# Patient Record
Sex: Female | Born: 1960 | Hispanic: No | Smoking: Never smoker
Health system: Southern US, Community
[De-identification: ages and names within clinical notes are randomized; demographics above are authoritative.]

## PROBLEM LIST (undated history)

## (undated) DIAGNOSIS — Z860101 Personal history of adenomatous and serrated colon polyps: Secondary | ICD-10-CM

## (undated) DIAGNOSIS — F419 Anxiety disorder, unspecified: Secondary | ICD-10-CM

## (undated) DIAGNOSIS — K219 Gastro-esophageal reflux disease without esophagitis: Secondary | ICD-10-CM

## (undated) DIAGNOSIS — G620 Drug-induced polyneuropathy: Secondary | ICD-10-CM

## (undated) DIAGNOSIS — Z86010 Personal history of colonic polyps: Secondary | ICD-10-CM

## (undated) DIAGNOSIS — J45909 Unspecified asthma, uncomplicated: Secondary | ICD-10-CM

## (undated) DIAGNOSIS — F32A Depression, unspecified: Secondary | ICD-10-CM

## (undated) DIAGNOSIS — E039 Hypothyroidism, unspecified: Secondary | ICD-10-CM

## (undated) DIAGNOSIS — T451X5A Adverse effect of antineoplastic and immunosuppressive drugs, initial encounter: Secondary | ICD-10-CM

## (undated) DIAGNOSIS — E042 Nontoxic multinodular goiter: Secondary | ICD-10-CM

## (undated) HISTORY — DX: Unspecified asthma, uncomplicated: J45.909

## (undated) HISTORY — PX: DILATION AND CURETTAGE OF UTERUS: SHX78

## (undated) HISTORY — DX: Hypothyroidism, unspecified: E03.9

---

## 1999-04-13 ENCOUNTER — Other Ambulatory Visit: Admission: RE | Admit: 1999-04-13 | Discharge: 1999-04-13 | Payer: Self-pay | Admitting: *Deleted

## 2000-07-05 ENCOUNTER — Other Ambulatory Visit: Admission: RE | Admit: 2000-07-05 | Discharge: 2000-07-05 | Payer: Self-pay | Admitting: *Deleted

## 2000-08-17 ENCOUNTER — Encounter (INDEPENDENT_AMBULATORY_CARE_PROVIDER_SITE_OTHER): Payer: Self-pay

## 2000-08-17 ENCOUNTER — Ambulatory Visit (HOSPITAL_COMMUNITY): Admission: RE | Admit: 2000-08-17 | Discharge: 2000-08-17 | Payer: Self-pay | Admitting: *Deleted

## 2000-08-17 HISTORY — PX: DILATION AND EVACUATION: SHX1459

## 2006-08-02 ENCOUNTER — Other Ambulatory Visit: Admission: RE | Admit: 2006-08-02 | Discharge: 2006-08-02 | Payer: Self-pay | Admitting: *Deleted

## 2007-12-02 ENCOUNTER — Other Ambulatory Visit: Admission: RE | Admit: 2007-12-02 | Discharge: 2007-12-02 | Payer: Self-pay | Admitting: Family Medicine

## 2010-11-04 NOTE — H&P (Signed)
[facility]  Patient:    Tiffany [name], Tiffany [name]                          MRN: [id number] Adm. Date:  [id number] Attending:  Donne Hazel                         History and Physical  HISTORY OF PRESENT ILLNESS:   Ms. Burney Gauze is a 50 year old female, gravida 2, para 1, admitted for D&E due to a missed AB. The patient was followed in early pregnancy with ultrasounds; on August 16, 2000, an ultrasound revealed a missed AB. The patient was then given the option of expectant management versus D&E. She requested D&E for termination of the pregnancy. She is healthy otherwise. Her blood type is A positive.  MEDICAL HISTORY:              History of hypothyroidism.  OBSTETRIC HISTORY:            Cesarean section x 1 at term.  SURGICAL HISTORY:             (1) Nasal surgery x 1 (2) Cesarean section x 1.  CURRENT MEDICATIONS:          Synthroid.  ALLERGIES:                    None known.  PHYSICAL EXAMINATION:  VITAL SIGNS:                  Stable, temperature 97.7, pulse 78, respirations 20, blood pressure 125/69.  GENERAL:                      Well-developed, well-nourished female in no acute distress.  HEENT:                        Within normal limits.  NECK:                         Supple without adenopathy or thyromegaly.  HEART:                        Regular rate and rhythm without murmur, gallop, or rub.  LUNGS:                        Clear to auscultation.  BREASTS:                      Done recently in the office was normal but is deferred upon admission.  ABDOMEN:                      Soft and benign.  EXTREMITIES AND NEUROLOGIC:   Grossly normal.  PELVIC:                       Normal external female genitalia, vagina/cervix clear. The uterus is about 6 weeks in size and freely mobile, the adnexa is clear of mass.  ADMITTING DIAGNOSES:          1. Missed abortion.                               2. Blood type A positive.  PLAN:  Suction dilation and evacuation.  DISCUSSION:                   The risk and benefits of surgery were explained to the patient. She was also given the option of expectant management but declined this. The risk of bleeding, infection, and risk of injury to surrounding organs were reviewed. The patient was allowed to ask questions and wished to proceed with D&E. DD:  08/17/00 TD:  08/17/00 Job: [id number]

## 2010-11-04 NOTE — Op Note (Signed)
[facility]  Patient:    Tiffany [name], Tiffany [name]                          MRN: [id number] Proc. Date: 08/17/00 Adm. Date:  [id number] Disc. Date: [id number] Attending:  Donne Hazel                           Operative Report  PREOPERATIVE DIAGNOSIS:       Missed abortion.  POSTOPERATIVE DIAGNOSIS:      Missed abortion.  PROCEDURE:                    Dilation and evacuation.  SURGEON:                      Willey Blade, M.D.  ANESTHESIA:                   MAC.  ESTIMATED BLOOD LOSS:         100 cc.  FINDINGS:                     Products of conception.  COMPLICATIONS:                None.  DESCRIPTION OF PROCEDURE:     The patient was taken to the operating room where a MAC anesthetic was administered. The patient was placed on the operating table in the dorsal lithotomy position, the perineum and vagina were prepped and draped in the usual sterile fashion with Betadine and sterile drapes. A speculum was placed and the anterior lip of the cervix was grasped with a single-tooth tenaculum. The cervix was then serially dilated until a #21 Pratt dilator fit easily into the endocervical os. Next, a #7 curved suction curet was introduced into the uterus and the uterus was systematically emptied in the standard fashion. Multiple passes were made throughout the uterus with products of conception aspirated. The uterus was then sharply curetted with a small serrated curet and noted to be empty. Two final passes were then made with the suction curet and its emptiness was noted. All vaginal instruments were removed. The patient was then awakened and taken to the recovery room in good condition. There were no perioperative complications. Maternal blood type A positive. DD:  08/17/00 TD:  08/19/00 Job: [id number]

## 2011-02-16 ENCOUNTER — Other Ambulatory Visit: Payer: Self-pay | Admitting: Family Medicine

## 2011-02-16 ENCOUNTER — Other Ambulatory Visit (HOSPITAL_COMMUNITY)
Admission: RE | Admit: 2011-02-16 | Discharge: 2011-02-16 | Disposition: A | Discharge status: Home / Self Care | Payer: BC Managed Care – PPO | Source: Ambulatory Visit | Attending: Family Medicine | Admitting: Family Medicine

## 2011-02-16 DIAGNOSIS — Z124 Encounter for screening for malignant neoplasm of cervix: Secondary | ICD-10-CM | POA: Insufficient documentation

## 2012-02-21 ENCOUNTER — Other Ambulatory Visit: Payer: Self-pay | Admitting: Family Medicine

## 2012-02-21 ENCOUNTER — Ambulatory Visit
Admission: RE | Admit: 2012-02-21 | Discharge: 2012-02-21 | Disposition: A | Discharge status: Home / Self Care | Payer: Managed Care, Other (non HMO) | Source: Ambulatory Visit | Attending: Family Medicine | Admitting: Family Medicine

## 2012-02-21 ENCOUNTER — Other Ambulatory Visit (HOSPITAL_COMMUNITY)
Admission: RE | Admit: 2012-02-21 | Discharge: 2012-02-21 | Disposition: A | Discharge status: Home / Self Care | Payer: Managed Care, Other (non HMO) | Source: Ambulatory Visit | Attending: Family Medicine | Admitting: Family Medicine

## 2012-02-21 DIAGNOSIS — N9489 Other specified conditions associated with female genital organs and menstrual cycle: Secondary | ICD-10-CM

## 2012-02-21 DIAGNOSIS — Z124 Encounter for screening for malignant neoplasm of cervix: Secondary | ICD-10-CM | POA: Insufficient documentation

## 2013-02-25 ENCOUNTER — Other Ambulatory Visit: Payer: Self-pay | Admitting: Family Medicine

## 2013-02-25 DIAGNOSIS — E049 Nontoxic goiter, unspecified: Secondary | ICD-10-CM

## 2013-02-26 ENCOUNTER — Ambulatory Visit
Admission: RE | Admit: 2013-02-26 | Discharge: 2013-02-26 | Disposition: A | Discharge status: Home / Self Care | Payer: 59 | Source: Ambulatory Visit | Attending: Family Medicine | Admitting: Family Medicine

## 2013-02-26 DIAGNOSIS — E049 Nontoxic goiter, unspecified: Secondary | ICD-10-CM

## 2013-03-03 ENCOUNTER — Other Ambulatory Visit: Payer: Self-pay | Admitting: Family Medicine

## 2013-03-03 DIAGNOSIS — E041 Nontoxic single thyroid nodule: Secondary | ICD-10-CM

## 2013-03-05 ENCOUNTER — Other Ambulatory Visit (HOSPITAL_COMMUNITY)
Admission: RE | Admit: 2013-03-05 | Discharge: 2013-03-05 | Disposition: A | Discharge status: Home / Self Care | Payer: 59 | Source: Ambulatory Visit | Attending: Interventional Radiology | Admitting: Interventional Radiology

## 2013-03-05 ENCOUNTER — Ambulatory Visit
Admission: RE | Admit: 2013-03-05 | Discharge: 2013-03-05 | Disposition: A | Discharge status: Home / Self Care | Payer: 59 | Source: Ambulatory Visit | Attending: Family Medicine | Admitting: Family Medicine

## 2013-03-05 DIAGNOSIS — E041 Nontoxic single thyroid nodule: Secondary | ICD-10-CM | POA: Insufficient documentation

## 2013-06-10 ENCOUNTER — Other Ambulatory Visit: Payer: Self-pay | Admitting: Family Medicine

## 2013-06-10 DIAGNOSIS — N939 Abnormal uterine and vaginal bleeding, unspecified: Secondary | ICD-10-CM

## 2013-06-27 ENCOUNTER — Ambulatory Visit
Admission: RE | Admit: 2013-06-27 | Discharge: 2013-06-27 | Disposition: A | Discharge status: Home / Self Care | Payer: 59 | Source: Ambulatory Visit | Attending: Family Medicine | Admitting: Family Medicine

## 2013-06-27 DIAGNOSIS — N939 Abnormal uterine and vaginal bleeding, unspecified: Secondary | ICD-10-CM

## 2013-08-08 IMAGING — US US TRANSVAGINAL NON-OB
1 series · 14 of 25 positions shown · non-contrast
Comparison: None.

CLINICAL DATA: Adnexal mass.

TRANSABDOMINAL AND TRANSVAGINAL ULTRASOUND OF PELVIS
TECHNIQUE: Both transabdominal and transvaginal ultrasound
examinations of the pelvis were performed. Transabdominal technique
was performed for global imaging of the pelvis including uterus,
ovaries, adnexal regions, and pelvic cul-de-sac.
It was necessary to proceed with endovaginal exam following the
transabdominal exam to visualize the uterus, endometrium, ovaries
and adnexal regions.

[Series 1: us transvaginal non-ob · 0.26mm/px · 14 of 100 slices shown]
[im 1/100]
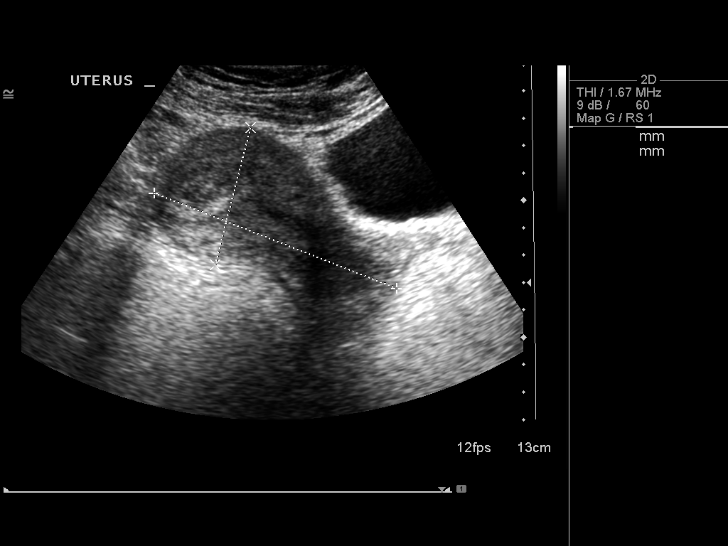
[im 9/100]
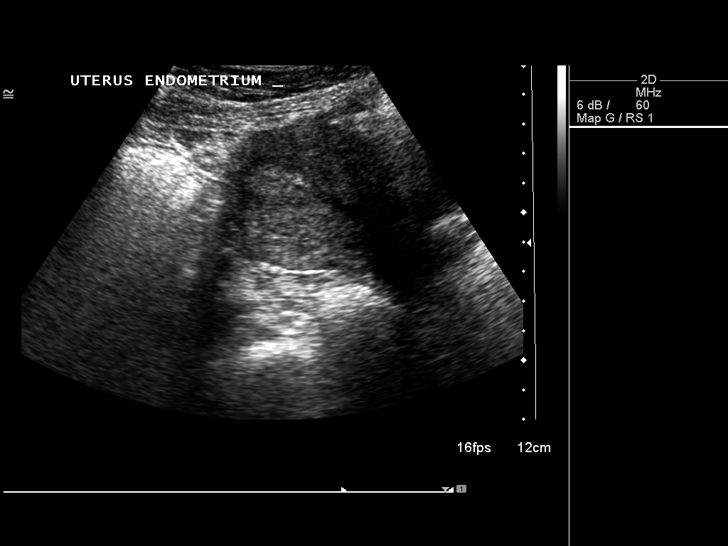
[im 17/100]
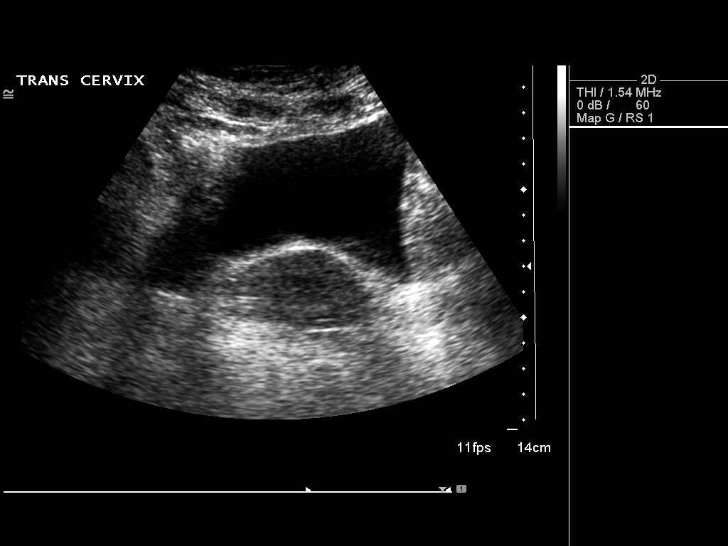
[im 25/100]
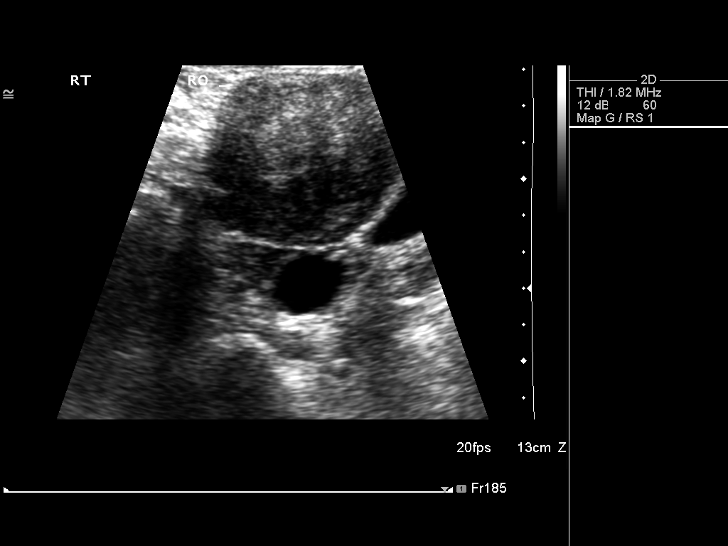
[im 34/100]
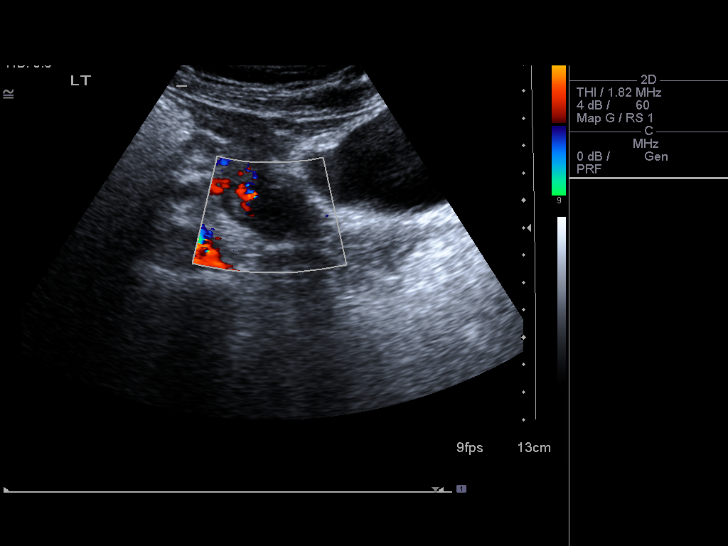
[im 38/100]
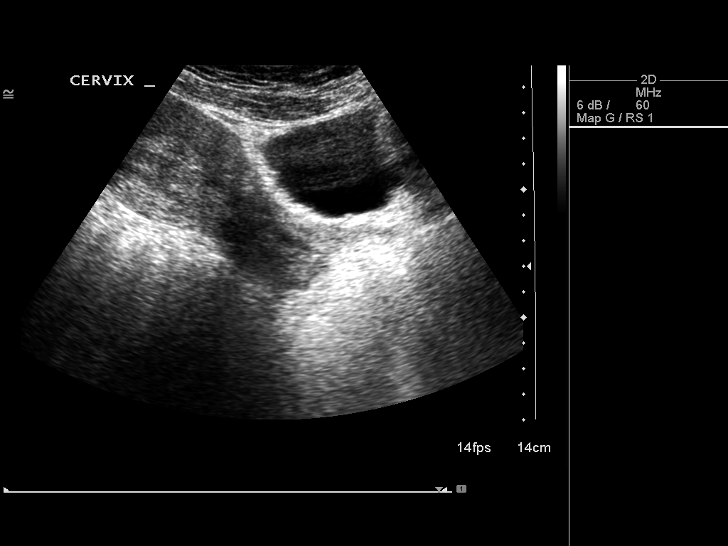
[im 46/100]
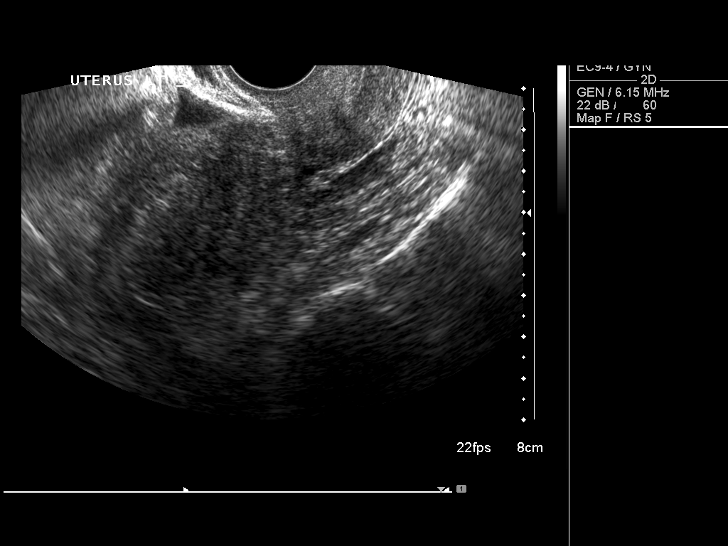
[im 54/100]
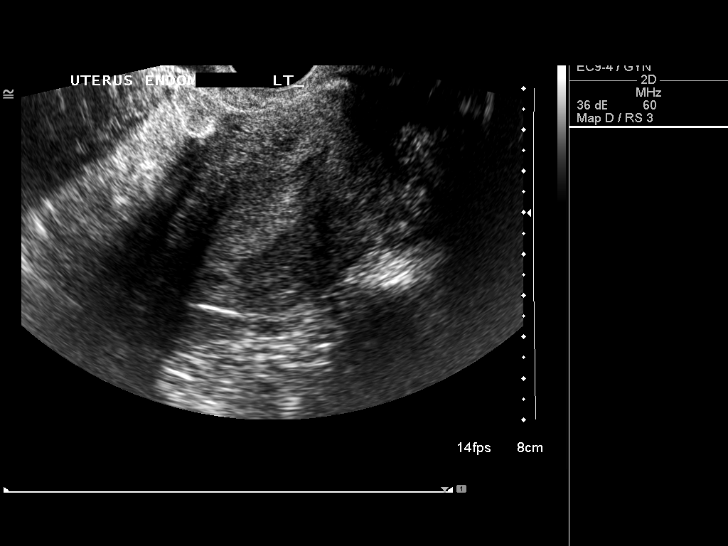
[im 62/100]
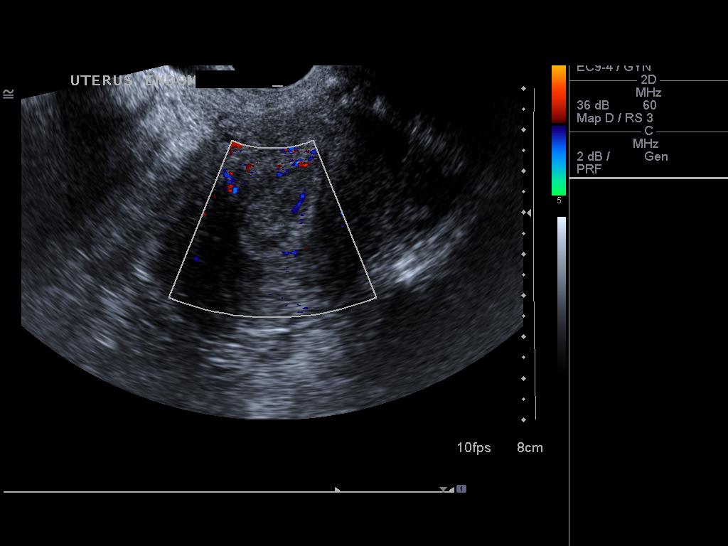
[im 67/100]
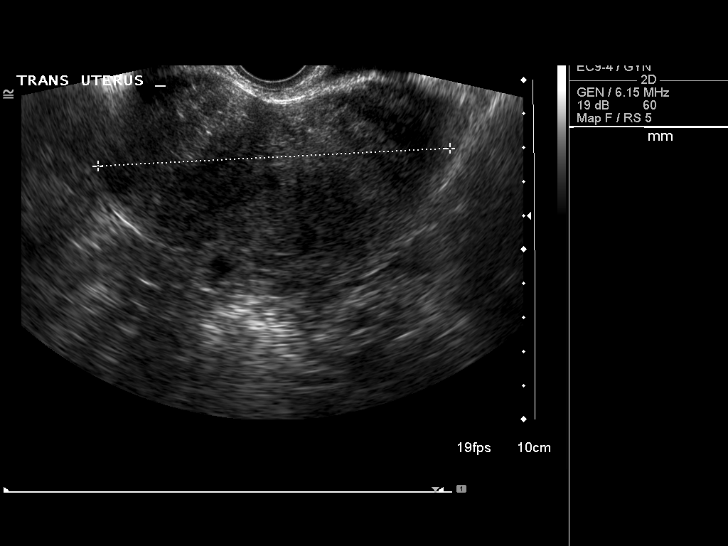
[im 75/100]
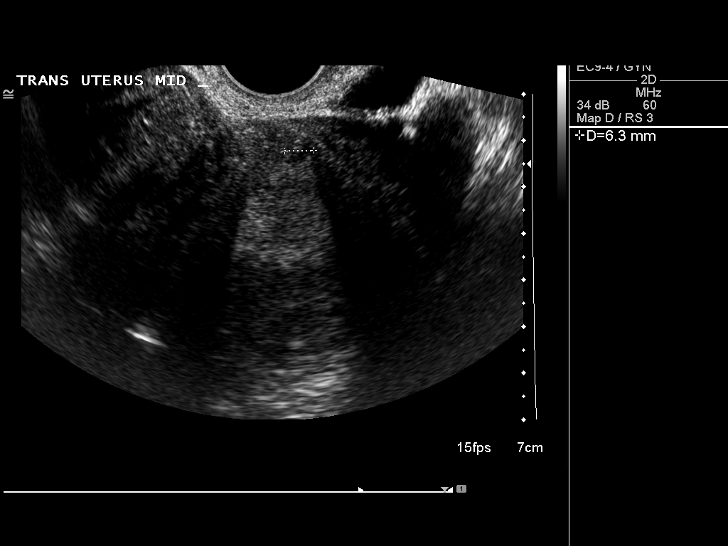
[im 83/100]
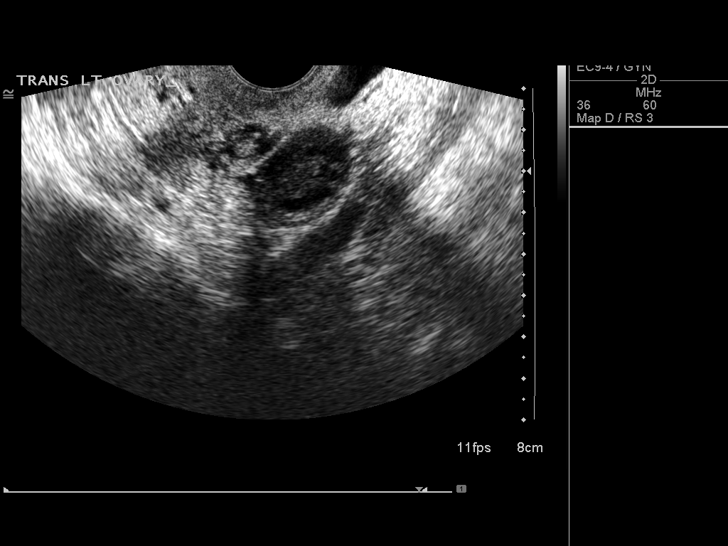
[im 91/100]
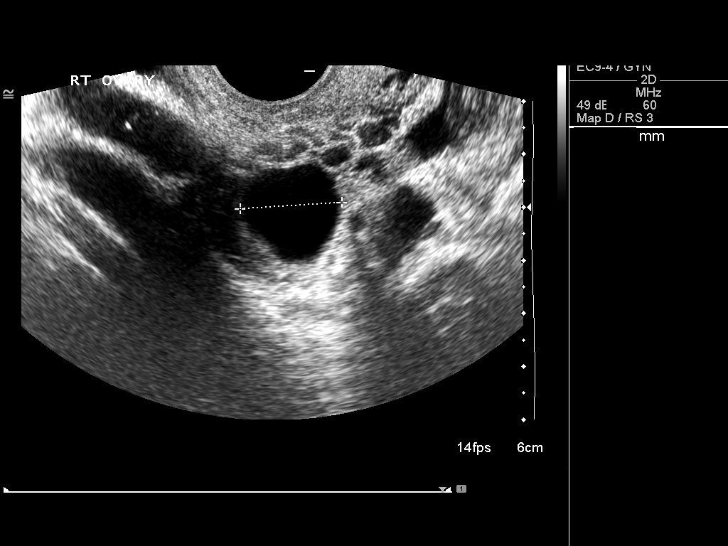
[im 100/100]
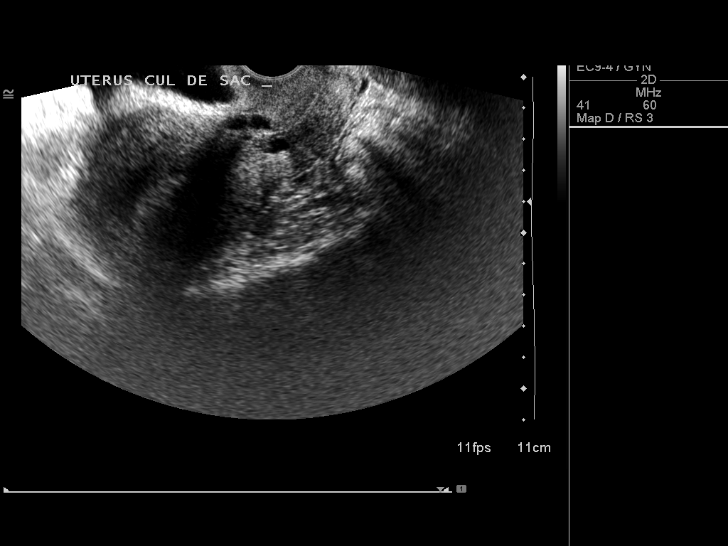

[14 of 25 positions shown; findings below may reference images not displayed]

FINDINGS: Uterus: Measures 9.9 x 8.8 x 10.4 cm.  Hypoechoic lesions measure
up to 5.1 x 4.7 x 5.0 cm off the right lateral aspect of the
fundus/body.

Endometrium: Measures 1.5 cm and appears slightly heterogeneous in
echogenicity.

Right ovary:  Measures 2.8 x 2.1 x 3.0 cm, negative. No adnexal
mass.

Left ovary: Measures 2.5 x 2.2 x 2.1 cm, negative. No adnexal mass.

Other findings: No free fluid.
IMPRESSION: Uterine fibroids.  A sizable fibroid along the right lateral aspect
of the uterine fundus/body may account for the right adnexal mass
questioned on physical exam..

## 2014-06-19 HISTORY — PX: COLONOSCOPY: SHX174

## 2014-08-21 IMAGING — US US THYROID BIOPSY
1 series · 13 of 13 positions shown · non-contrast
Comparison: none

INDICATION: Indeterminate nodule within the thyroid isthmus

[Series 1: us thyroid biopsy · 0.08mm/px · 13 acquisitions, 13 frames shown]
[im 1/13]
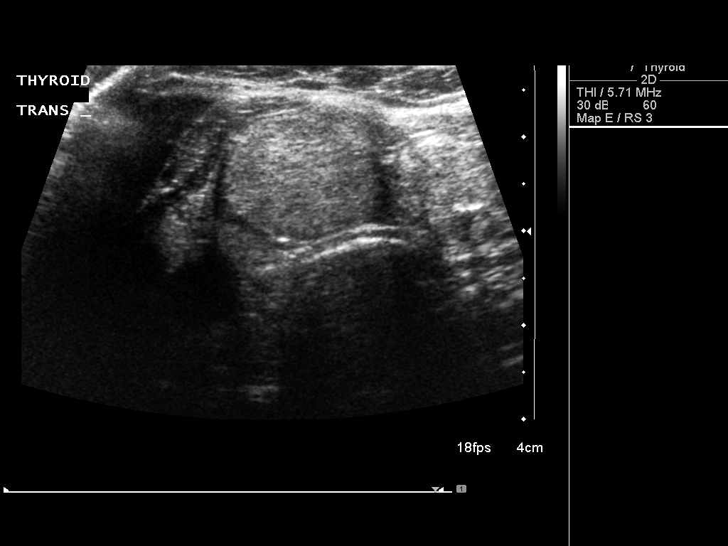
[im 2/13]
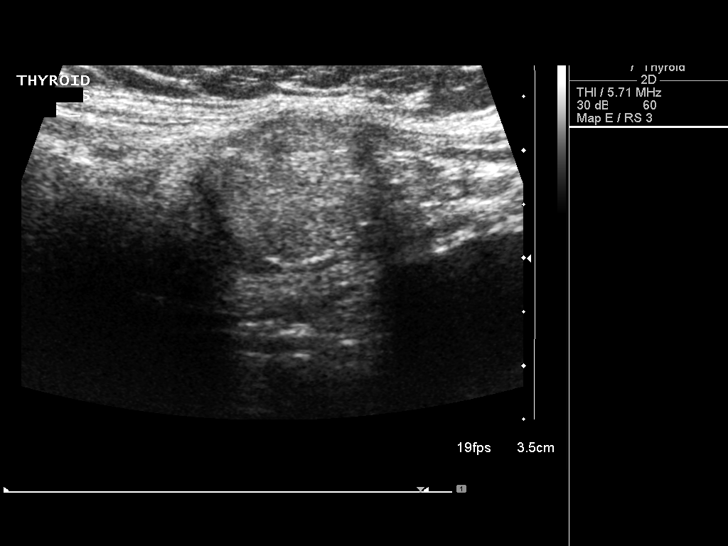
[im 3/13]
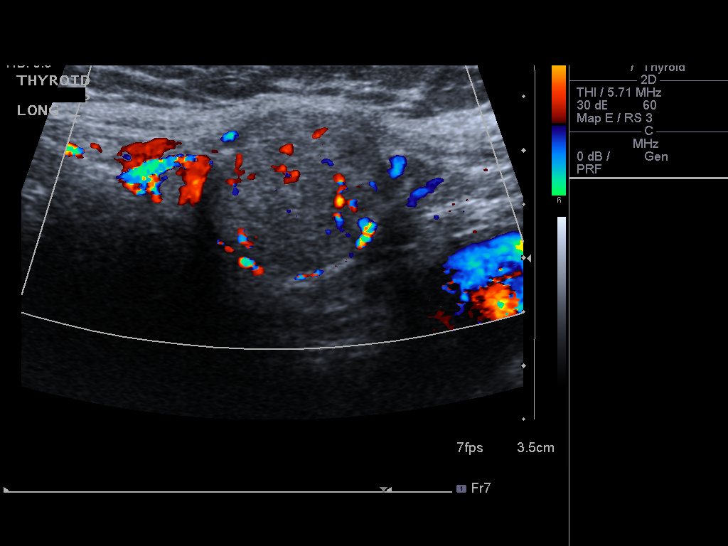
[im 4/13]
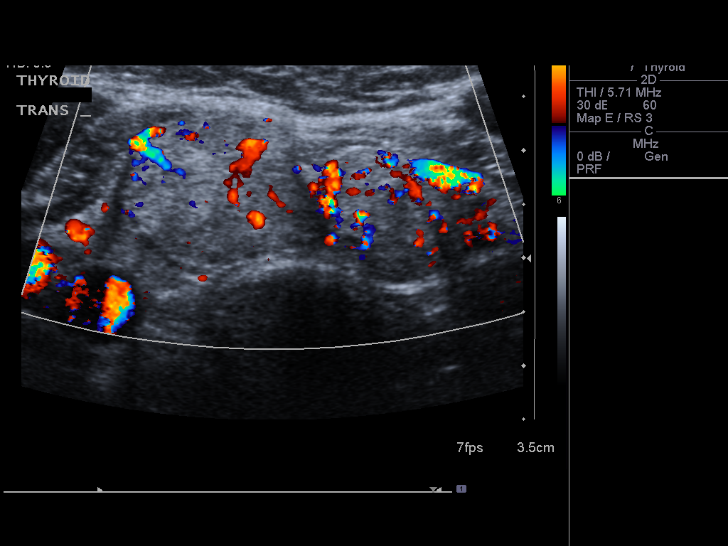
[im 5/13]
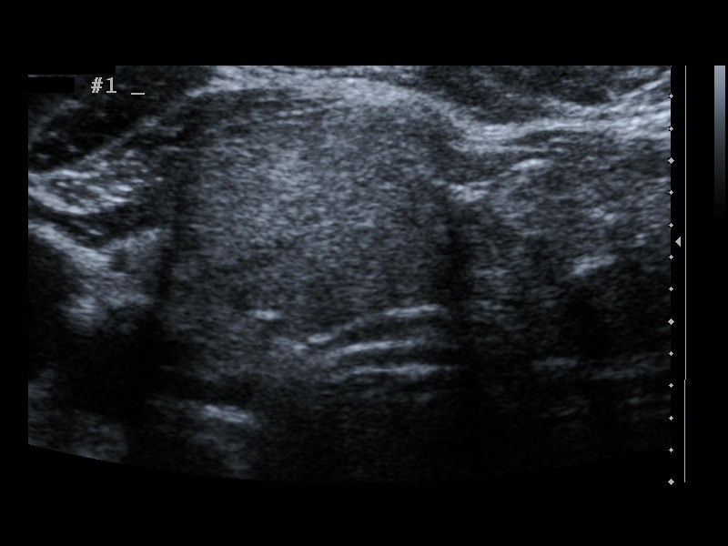
[im 6/13]
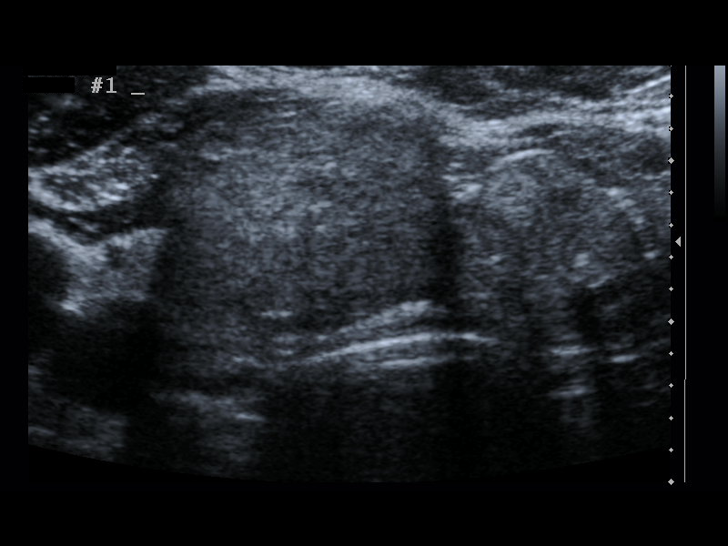
[im 7/13]
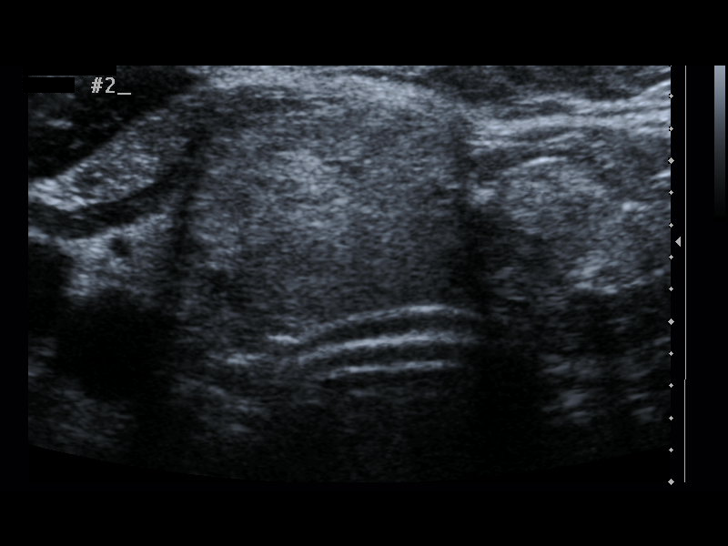
[im 8/13]
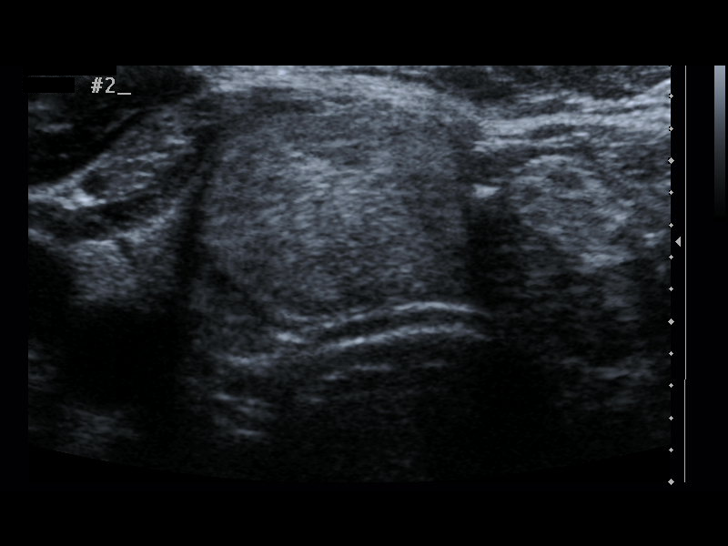
[im 9/13]
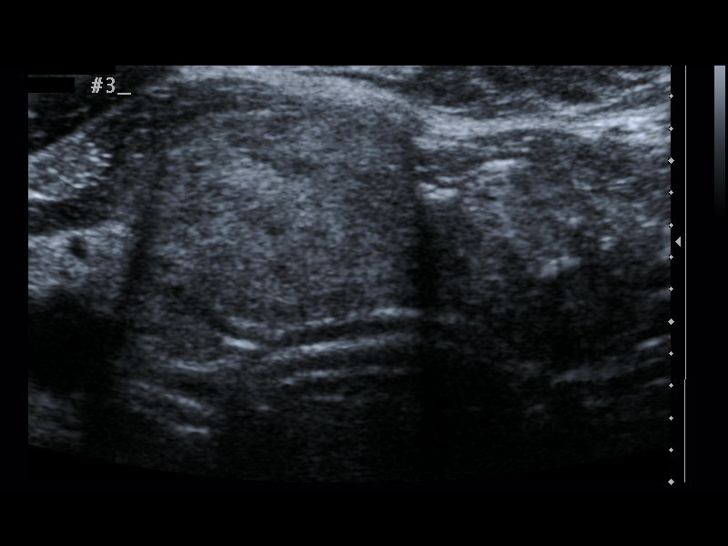
[im 10/13]
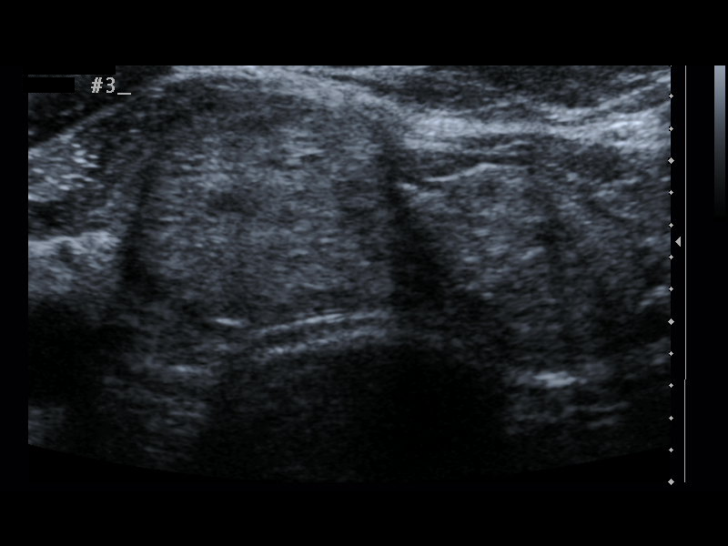
[im 11/13]
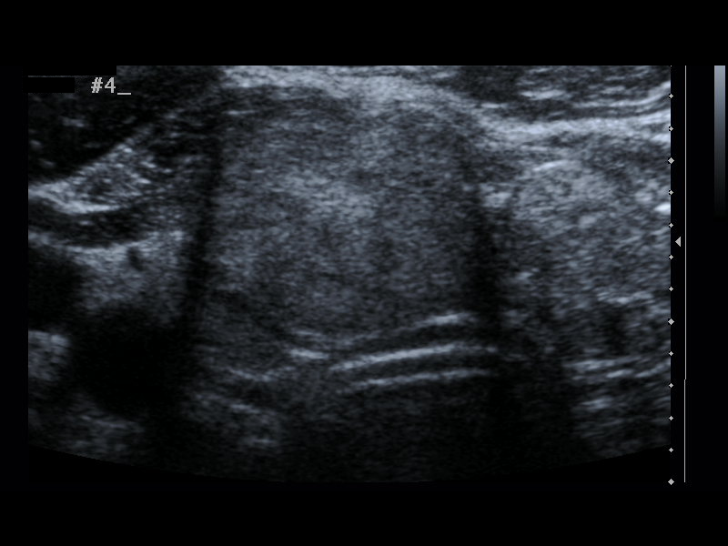
[im 12/13]
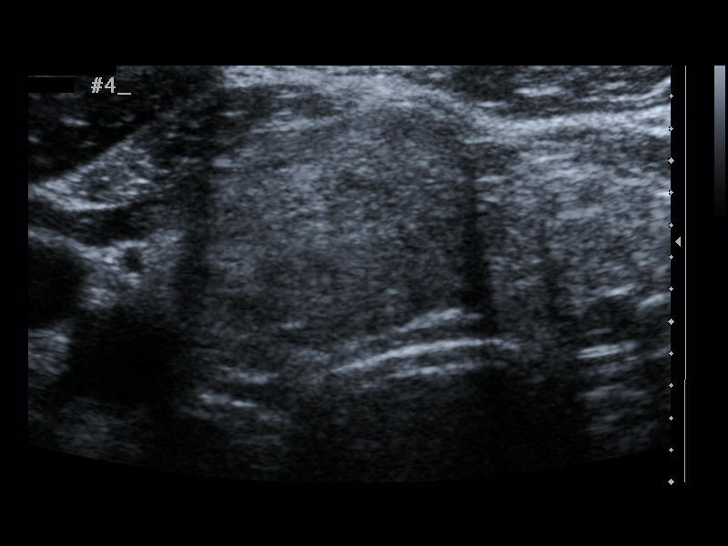
[im 13/13]
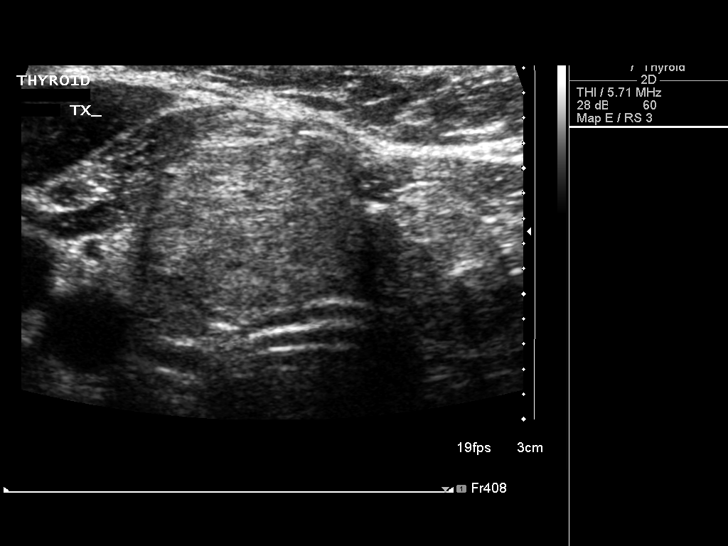

[13 of 13 positions shown; findings below may reference images not displayed]

ULTRASOUND GUIDED THYROID FINE NEEDLE ASPIRATION

Comparisons: Thyroid Ultrasound - 02/26/2013

Intravenous Medications: None

Complications: None immediate

Technique / Findings:

Informed written consent was obtained from the patient after a
discussion of the risks, benefits and alternatives to treatment.
Questions regarding the procedure were encouraged and answered.  A
timeout was performed prior to the initiation of the procedure.

Pre-procedural ultrasound scanning demonstrated grossly unchanged
appearance of the approximately 1.6 cm nodule within the right
aspect of the thyroid isthmus..  The procedure was planned.  The
neck was prepped in the usual sterile fashion, and a sterile drape
was applied covering the operative field.  A timeout was performed
prior to the initiation of the procedure.  Local anesthesia was
provided with 1% lidocaine.

Under direct ultrasound guidance, 4 FNA biopsies were performed of
nodule with a 25 gauge needle.  The samples were prepared and
submitted to pathology.  Limited post procedural scanning was
negative for hematoma or additional complication.  A dressing was
placed.  The patient tolerated procedure well without immediate
postprocedural complication.
IMPRESSION: Technically successful ultrasound guided fine needle aspiration of
indeterminate, nodule within the thyroid isthmus.

## 2014-12-13 IMAGING — US US TRANSVAGINAL NON-OB
1 series · 13 of 25 positions shown · non-contrast
Comparison: 02/21/2012.

CLINICAL DATA: Abnormal uterine bleeding.

EXAM:
TRANSABDOMINAL AND TRANSVAGINAL ULTRASOUND OF PELVIS
TECHNIQUE: Both transabdominal and transvaginal ultrasound examinations of the
pelvis were performed. Transabdominal technique was performed for
global imaging of the pelvis including uterus, ovaries, adnexal
regions, and pelvic cul-de-sac. It was necessary to proceed with
endovaginal exam following the transabdominal exam to visualize the
endometrium, ovaries and adnexal regions.

[Series 1: us transvaginal non-ob · 0.24mm/px · 13 of 87 slices shown]
[im 1/87]
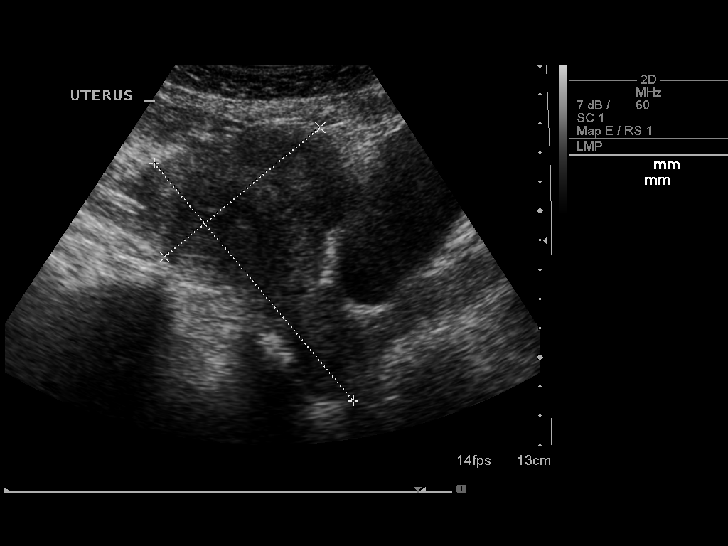
[im 8/87]
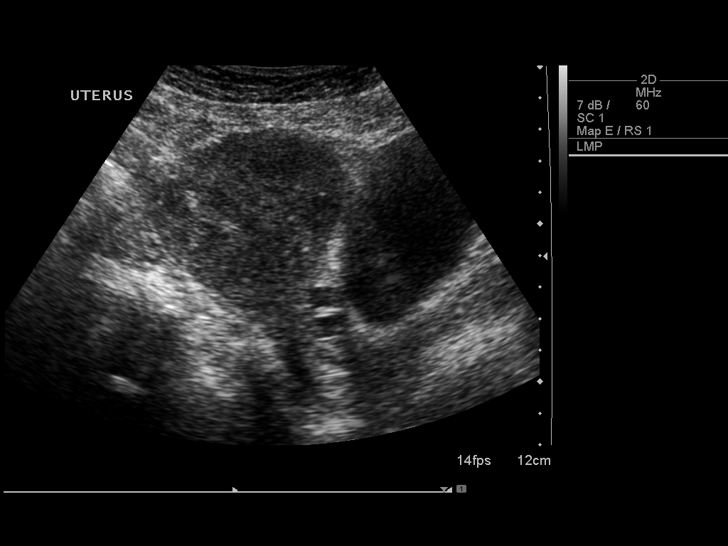
[im 15/87]
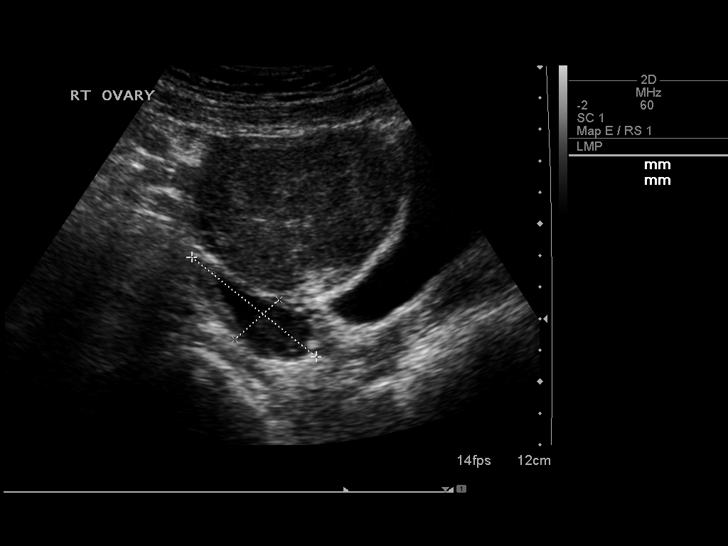
[im 22/87]
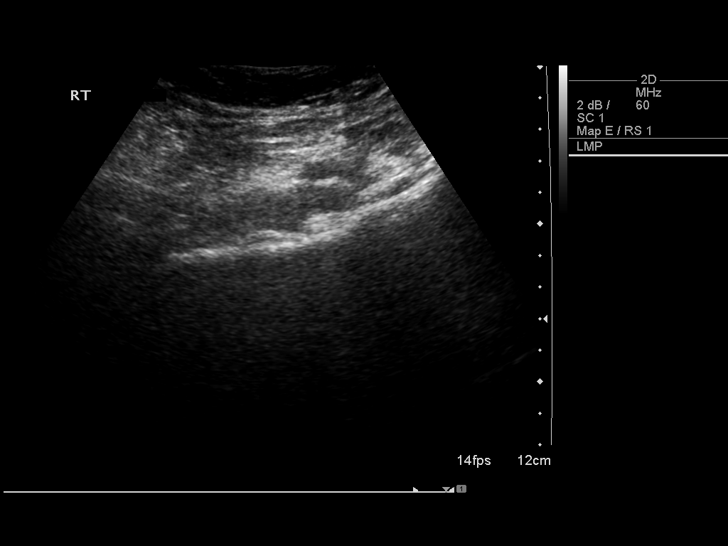
[im 29/87]
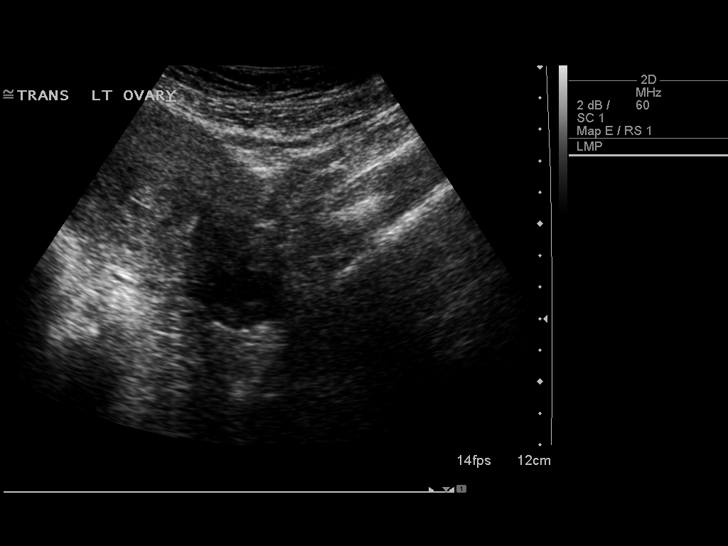
[im 36/87]
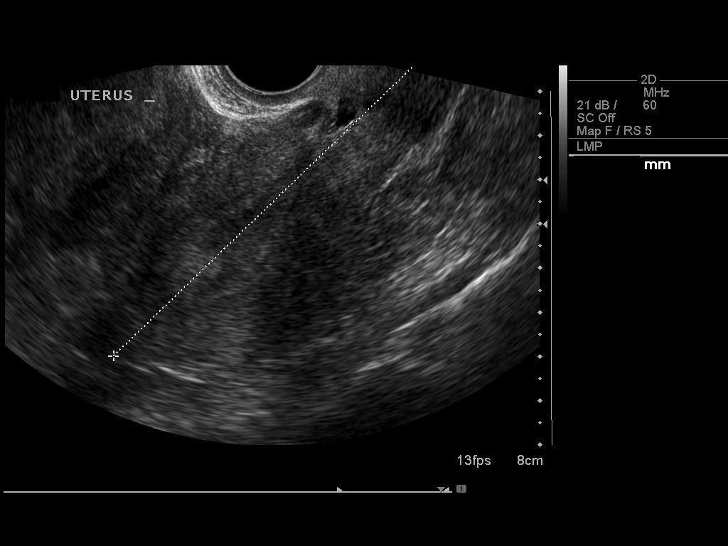
[im 44/87]
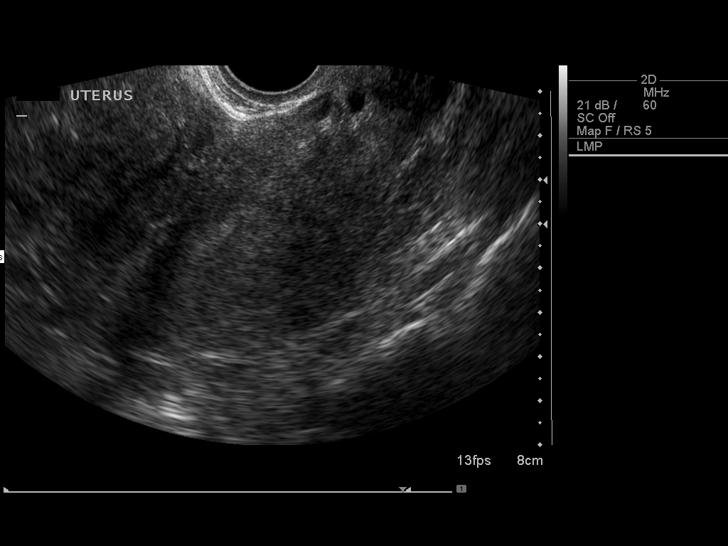
[im 51/87]
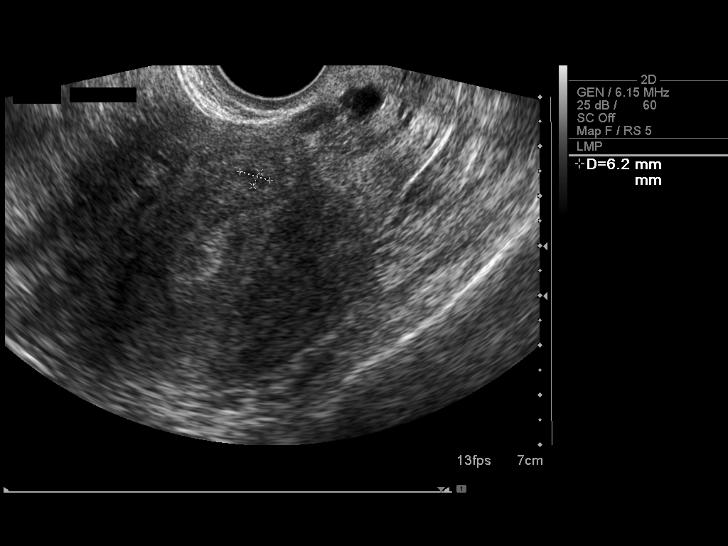
[im 58/87]
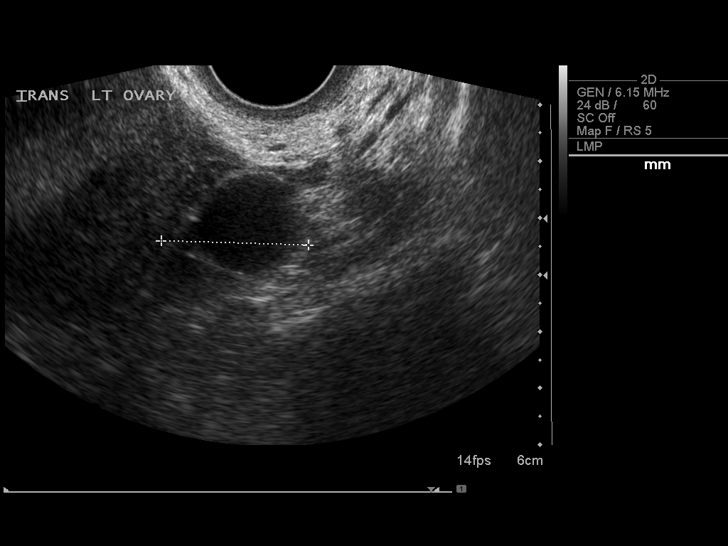
[im 65/87]
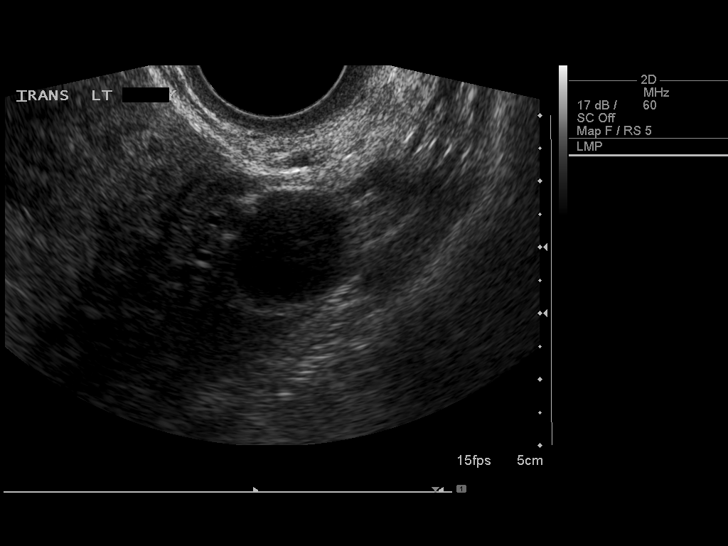
[im 72/87]
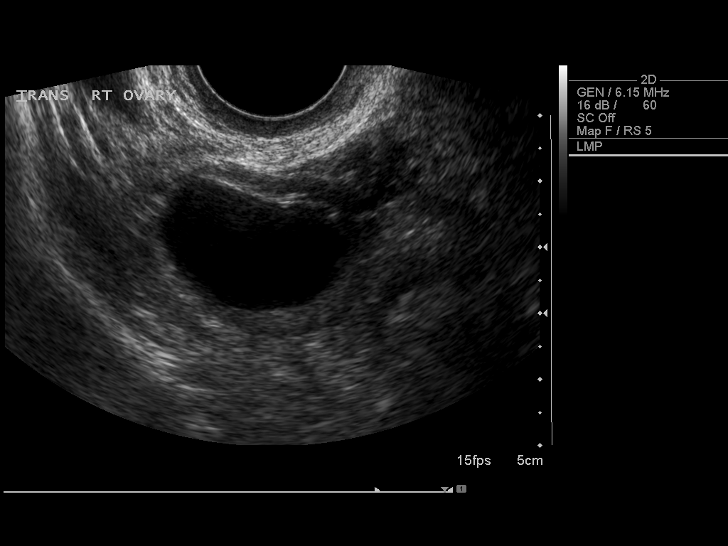
[im 79/87]
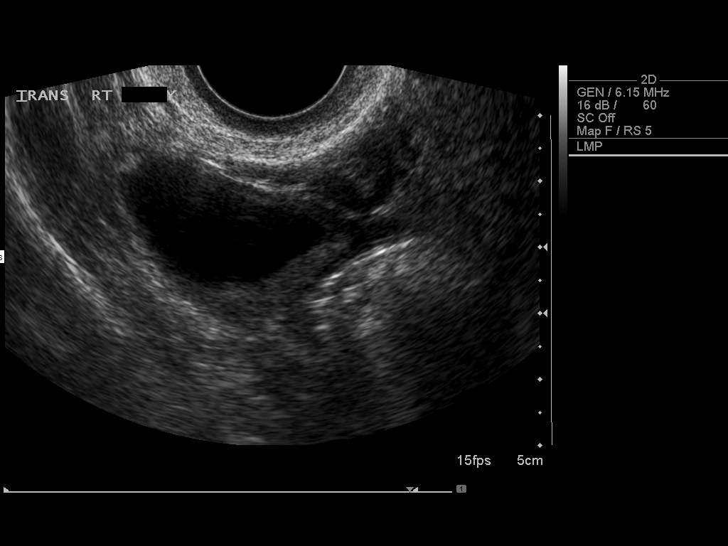
[im 87/87]
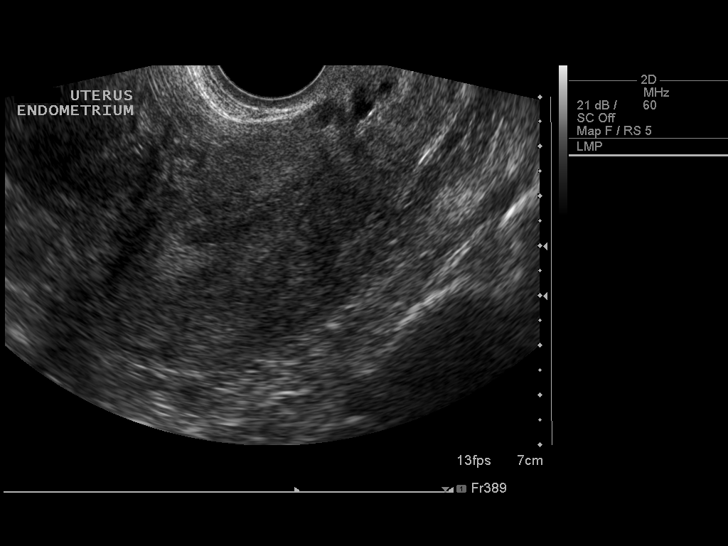

[13 of 25 positions shown; findings below may reference images not displayed]

FINDINGS: Uterus

Measurements: 10.6 x 6.9 x 10.5 cm. An exophytic hypoechoic mass
along the right aspect of the uterine fundus measures 6.4 x 4.9 x
5.4 cm (previously 5.1 x 4.7 x 5.0 cm). A smaller 6 mm fibroid is
seen in the anterior uterine body.

Endometrium

Thickness: 18 mm and may be slightly heterogeneous, as before. No
focal abnormality visualized.

Right ovary

Measurements: 3.7 x 2.0 x 3.4 cm. Contains a 3.1 cm anechoic lesion
with increased through transmission.

Left ovary

Measurements: 3.5 x 2.2 x 2.6 cm. Normal appearance/no adnexal mass.

Other findings

No free fluid.
IMPRESSION: 1. Large uterine fibroid, slightly larger than on 02/21/2012.
2. Right ovarian cyst. This is almost certainly benign, and no
specific imaging follow up is recommended according to the Society
of Radiologists in Nltrasound5PXP Consensus Conference Statement (TIGER
Ivica Biljana et al. Management of Asymptomatic Ovarian and Other Adnexal
Cysts Imaged at US: Society of Radiologists in Ultrasound Consensus

## 2015-03-17 ENCOUNTER — Encounter: Payer: Self-pay | Admitting: Obstetrics & Gynecology

## 2015-05-10 ENCOUNTER — Other Ambulatory Visit: Payer: Self-pay | Admitting: Gastroenterology

## 2017-04-06 ENCOUNTER — Other Ambulatory Visit (HOSPITAL_COMMUNITY)
Admission: RE | Admit: 2017-04-06 | Discharge: 2017-04-06 | Disposition: A | Discharge status: Home / Self Care | Payer: 59 | Source: Ambulatory Visit | Attending: Family Medicine | Admitting: Family Medicine

## 2017-04-06 ENCOUNTER — Other Ambulatory Visit: Payer: Self-pay | Admitting: Family Medicine

## 2017-04-06 DIAGNOSIS — Z124 Encounter for screening for malignant neoplasm of cervix: Secondary | ICD-10-CM | POA: Insufficient documentation

## 2017-04-10 LAB — CYTOLOGY - PAP
Diagnosis: NEGATIVE
HPV (WINDOPATH): NOT DETECTED

## 2017-04-26 ENCOUNTER — Encounter: Payer: Self-pay | Admitting: Registered"

## 2017-04-26 ENCOUNTER — Encounter: Payer: 59 | Attending: Family Medicine | Admitting: Registered"

## 2017-04-26 DIAGNOSIS — Z713 Dietary counseling and surveillance: Secondary | ICD-10-CM | POA: Diagnosis not present

## 2017-04-26 DIAGNOSIS — R7303 Prediabetes: Secondary | ICD-10-CM | POA: Diagnosis not present

## 2017-04-26 NOTE — Progress Notes (Signed)
  Medical Nutrition Therapy:  Appt start time: 4:27 end time:  5:39.   Assessment:  Primary concerns today: prediabetes with recent elevated A1c (6.4), Trig (244), and Chol (216).  Pt arrives stating she eats healthy, exercises regularly, and does not have family history of diabetes. Pt states she used to take synthroid for a long time and does not need it anymore. Pt states she is currently taking medication and has decided she is not going to take it forever; determined to eliminate medication intake with healthy lifestyle changes. Pt states she typically sleeps 8 hrs a night. Pt states she does not eat a lot of red meat.  Pt states she has been adding cinnamon, apple cider vinegar, and beets to her routine because she heard it helps with blood sugar levels. Pt states she loves fruit and eats more carbohydrates than protein. Pt typically goes about 17 hours without eating (dinner at 6pm to snack 11am next morning).   Pt has a lot of questions and wants to make sure she is doing what is best for her body.   Preferred Learning Style:   No preference indicated   Learning Readiness:   Ready  Change in progress   MEDICATIONS: See list   DIETARY INTAKE:  Usual eating pattern includes 1 meals and 3 snacks per day.  Everyday foods include fruit, nuts.  Avoided foods include processed meat, red meat.    24-hr recall:  B ( AM): 2-3 Tbs apple cider vinegar, a bite of scone Snk ( AM): banana, a few unsalted nuts  L ( PM): sandwich (whole grain bread, almond butter) Snk ( PM): homemade yogurt with skim milk (w/ chia seeds, flaxseeds, cinnamon) D ( PM): salad (feta cheese, cucumbers, tomatoes, avocado), 2-3 chicken nuggets, 6-7 Tbs spoons of chicken, rice, and beans Snk ( PM): pomegranate Beverages: coffee (w/ skim milk), water (80 oz), hot tea, green tea  Usual physical activity: walking 45 min, 7 days/week  Estimated energy needs: 1800 calories 200 g carbohydrates 135 g protein 50 g  fat  Progress Towards Goal(s):  In progress.   Nutritional Diagnosis:  NI-5.8.4 Inconsistent carbohydrate intake As related to food and nutrition-related knowledge deficit .  As evidenced by pt report of not eating balanced meals, skipping meals and recent A1c of 6.4..    Intervention:  Nutrition education and counseling. Pt was educated on importance of eating consistent, well-balanced meals and increasing physical activity.  Goals: - Aim to eat 3 meals a day. Can incorporate snacks as needed. - Include breakfast daily.  - Pair protein and carbohydrates together.  - Increase physical activity of moderate-intensity such as brisk walking.   Teaching Method Utilized:  Visual Auditory Hands on  Handouts given during visit include:  Planning healthy meals  My Plate  Barriers to learning/adherence to lifestyle change: none  Demonstrated degree of understanding via:  Teach Back   Monitoring/Evaluation:  Dietary intake, exercise, and body weight prn.

## 2017-04-26 NOTE — Patient Instructions (Addendum)
-   Aim to eat 3 meals a day. Can incorporate snacks as needed.  - Include breakfast daily.   - Pair protein and carbohydrates together.   - Increase physical activity of moderate-intensity such as brisk walking.

## 2017-06-26 ENCOUNTER — Other Ambulatory Visit: Payer: Self-pay | Admitting: Family Medicine

## 2017-06-26 DIAGNOSIS — E041 Nontoxic single thyroid nodule: Secondary | ICD-10-CM

## 2017-07-09 ENCOUNTER — Ambulatory Visit
Admission: RE | Admit: 2017-07-09 | Discharge: 2017-07-09 | Disposition: A | Discharge status: Home / Self Care | Payer: PRIVATE HEALTH INSURANCE | Source: Ambulatory Visit | Attending: Family Medicine | Admitting: Family Medicine

## 2017-07-09 DIAGNOSIS — E041 Nontoxic single thyroid nodule: Secondary | ICD-10-CM

## 2017-07-20 ENCOUNTER — Other Ambulatory Visit: Payer: Self-pay | Admitting: Family Medicine

## 2017-07-20 DIAGNOSIS — E042 Nontoxic multinodular goiter: Secondary | ICD-10-CM

## 2017-07-31 ENCOUNTER — Ambulatory Visit
Admission: RE | Admit: 2017-07-31 | Discharge: 2017-07-31 | Disposition: A | Discharge status: Home / Self Care | Payer: 59 | Source: Ambulatory Visit | Attending: Family Medicine | Admitting: Family Medicine

## 2017-07-31 ENCOUNTER — Other Ambulatory Visit (HOSPITAL_COMMUNITY)
Admission: RE | Admit: 2017-07-31 | Discharge: 2017-07-31 | Disposition: A | Discharge status: Home / Self Care | Payer: 59 | Source: Ambulatory Visit | Attending: Radiology | Admitting: Radiology

## 2017-07-31 DIAGNOSIS — E041 Nontoxic single thyroid nodule: Secondary | ICD-10-CM | POA: Diagnosis not present

## 2017-07-31 DIAGNOSIS — E042 Nontoxic multinodular goiter: Secondary | ICD-10-CM

## 2018-05-22 ENCOUNTER — Other Ambulatory Visit: Payer: Self-pay | Admitting: Family Medicine

## 2018-05-22 DIAGNOSIS — E042 Nontoxic multinodular goiter: Secondary | ICD-10-CM

## 2018-10-30 IMAGING — US US THYROID
1 series · 15 of 25 positions shown · non-contrast
Comparison: 02/26/2013

CLINICAL DATA: Prior ultrasound follow-up.  Bilateral nodules.

EXAM:
THYROID ULTRASOUND
TECHNIQUE: Ultrasound examination of the thyroid gland and adjacent soft
tissues was performed.

[Series 1: us thyroid · 0.05mm/px · 15 of 83 slices shown]
[im 1/83]
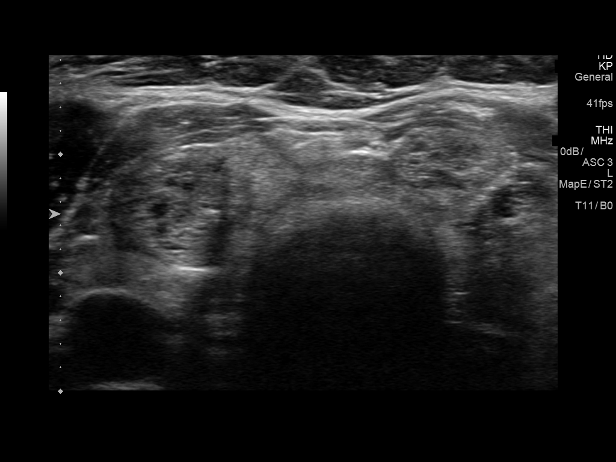
[im 7/83]
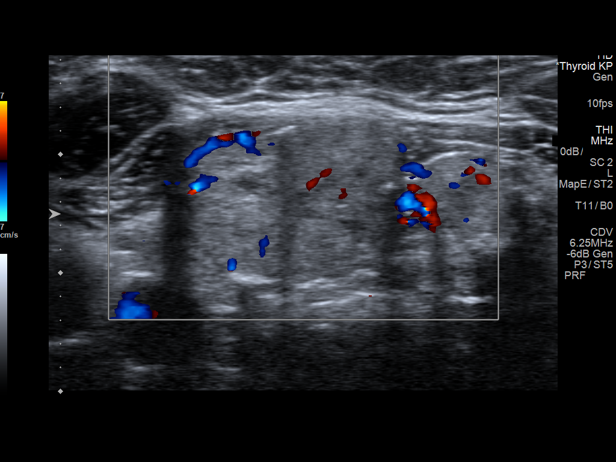
[im 14/83]
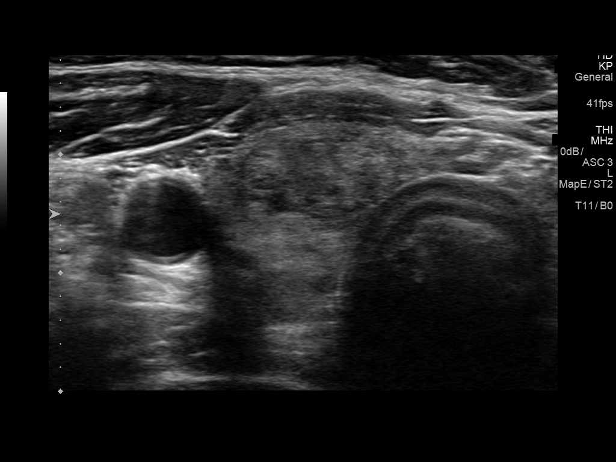
[im 18/83]
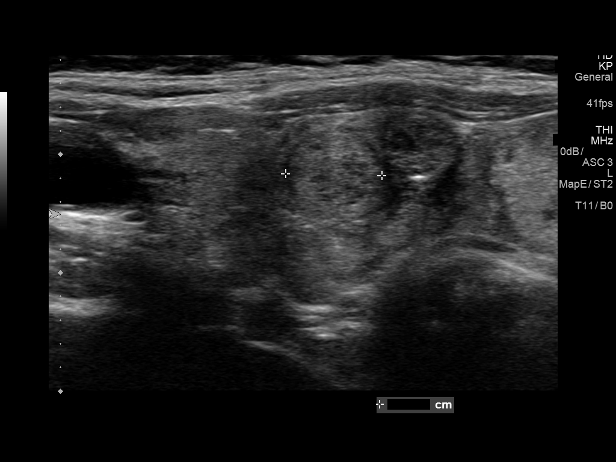
[im 24/83]
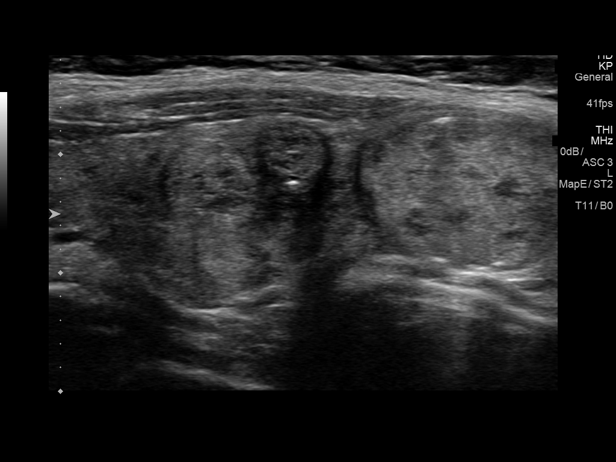
[im 31/83]
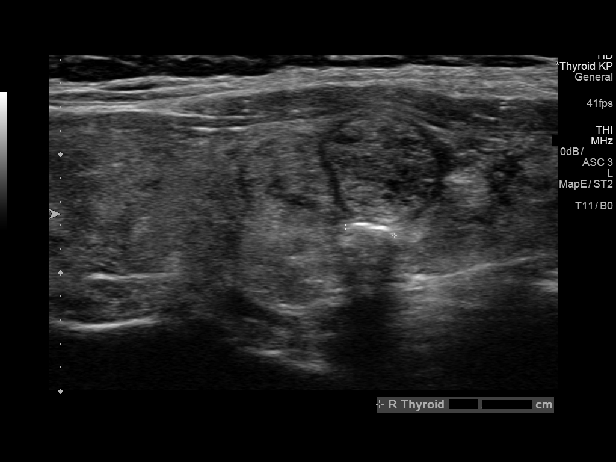
[im 35/83]
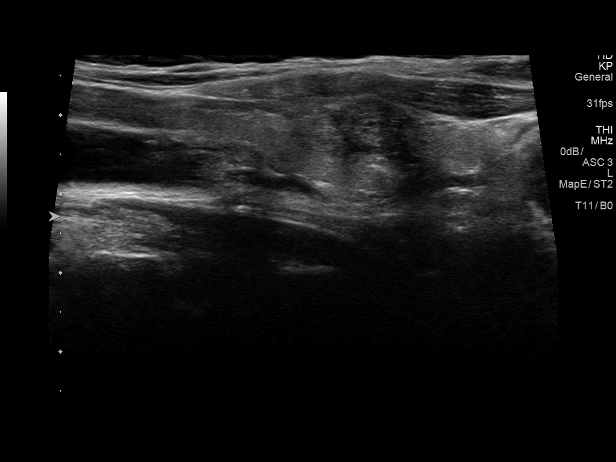
[im 42/83]
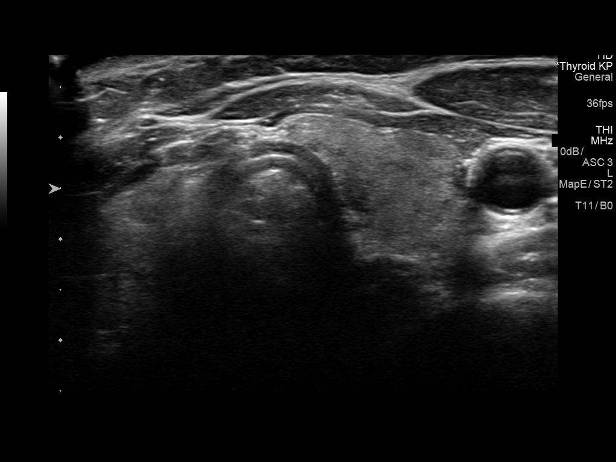
[im 48/83]
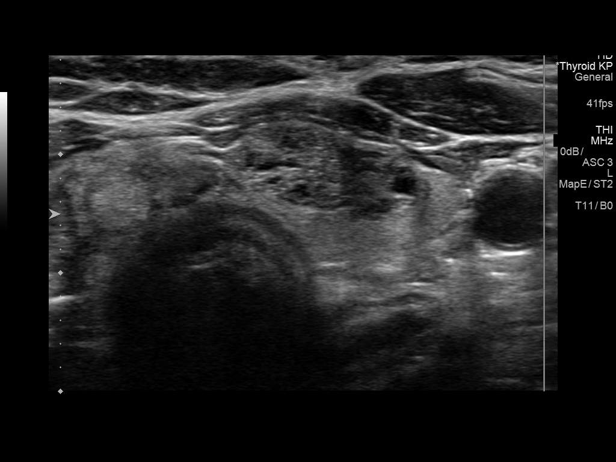
[im 52/83]
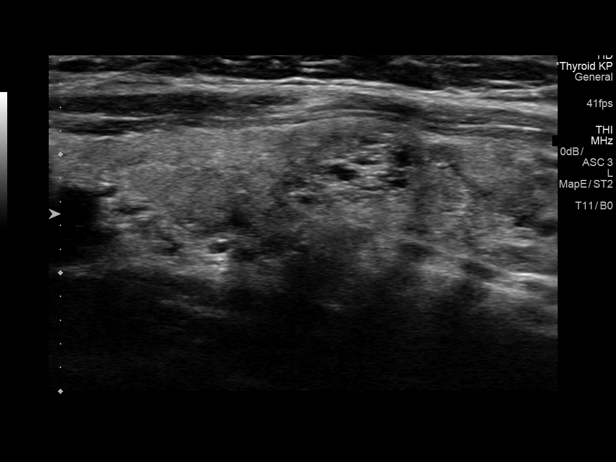
[im 59/83]
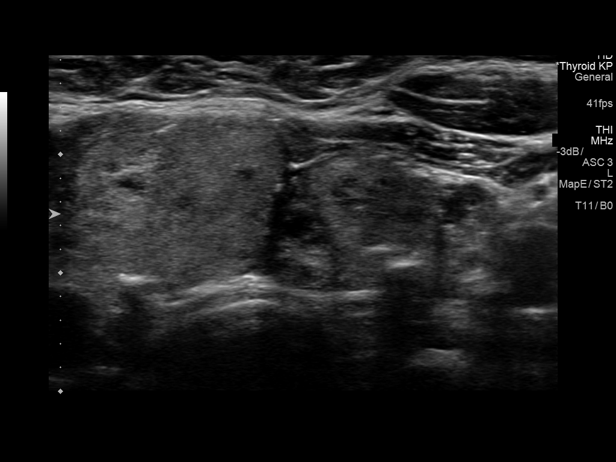
[im 65/83]
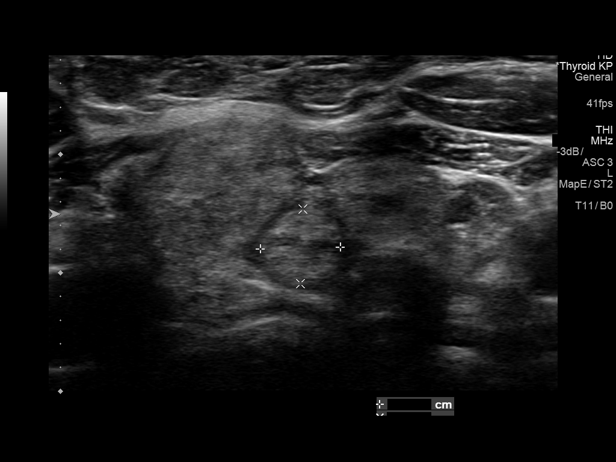
[im 69/83]
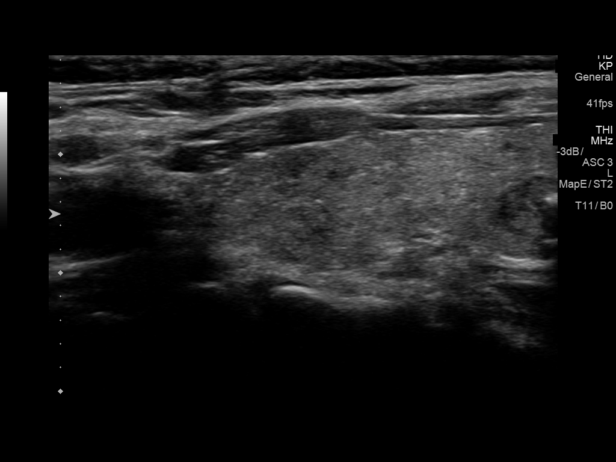
[im 76/83]
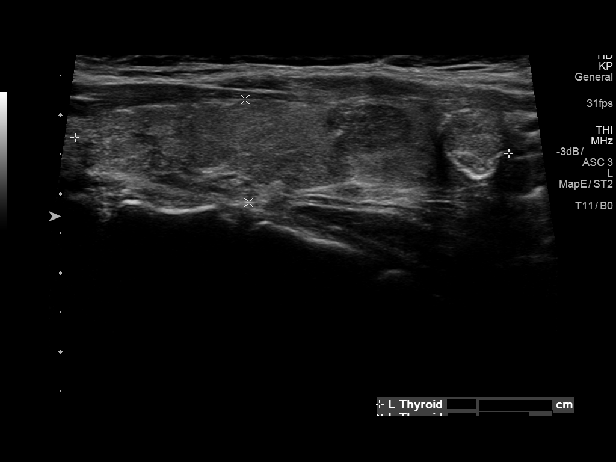
[im 83/83]
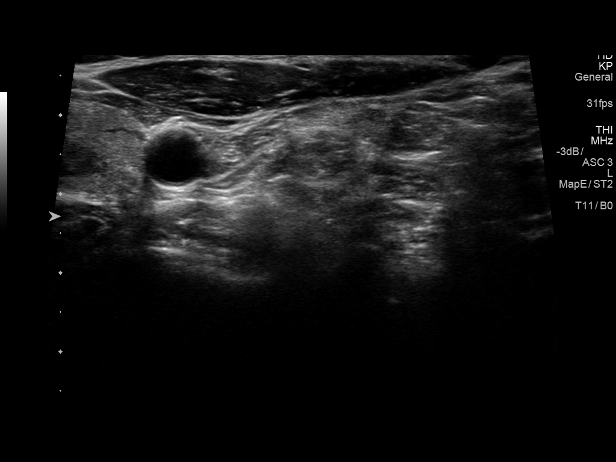

[15 of 25 positions shown; findings below may reference images not displayed]

FINDINGS: Parenchymal Echotexture: Moderately heterogenous

Isthmus: 1.3 cm, previously 0.4 cm

Right lobe: 5.8 x 1.6 x 1.6 cm, previously 5.7 x 1.6 x 1.6 cm

Left lobe: 5.5 x 1.3 x 1.4 cm, previously 5.7 x 1.5 x 1.5 cm

_________________________________________________________

Estimated total number of nodules >/= 1 cm: 4

Number of spongiform nodules >/=  2 cm not described below (TR1): 0

Number of mixed cystic and solid nodules >/= 1.5 cm not described
below (TR2): 0

_________________________________________________________

Nodule # 2:

Location: Right; Mid

Maximum size: 0.9 cm; Other 2 dimensions: 0.8 x 0.7 cm

Composition: solid/almost completely solid (2)

Echogenicity: hypoechoic (2)

Shape: not taller-than-wide (0)

Margins: smooth (0)

Echogenic foci: none (0)

ACR TI-RADS total points: 4.

ACR TI-RADS risk category: TR4 (4-6 points).

ACR TI-RADS recommendations:

Given size (<0.9 cm) and appearance, this nodule does NOT meet
TI-RADS criteria for biopsy or dedicated follow-up.

_________________________________________________________

Nodule # 3:

Prior biopsy: No

Location: Right; Mid

Maximum size: 0.9 cm; Other 2 dimensions: 0.9 x 0.9 cm, previously,
1.2 x 1.0 x 0.9 cm

Composition: solid/almost completely solid (2)

Echogenicity: hypoechoic (2)

Shape: not taller-than-wide (0)

Margins: smooth (0)

Echogenic foci: macrocalcifications (1)

ACR TI-RADS total points: 5.

ACR TI-RADS risk category:  TR4 (4-6 points).

Significant change in size (>/= 20% in two dimensions and minimal
increase of 2 mm): No

Change in features: No

Change in ACR TI-RADS risk category: No

ACR TI-RADS recommendations:

*Given size (>/= 1 - 1.4 cm) and appearance, a follow-up ultrasound
in 1 year should be considered based on TI-RADS criteria.

_________________________________________________________

Nodule # 4:

Prior biopsy: No

Location: Left; Mid

Maximum size: 0.8 cm; Other 2 dimensions: 0.8 x 0.6 cm, previously,
0.8 x 0.8 x 0.8 cm

Composition: solid/almost completely solid (2)

Echogenicity: isoechoic (1)

Shape: not taller-than-wide (0)

Margins: smooth (0)

Echogenic foci: peripheral calcifications (2)

ACR TI-RADS total points: 5.

ACR TI-RADS risk category:  TR4 (4-6 points).

Significant change in size (>/= 20% in two dimensions and minimal
increase of 2 mm): No

Change in features: No

Change in ACR TI-RADS risk category: No

ACR TI-RADS recommendations:

Given size (<0.9 cm) and appearance, this nodule does NOT meet
TI-RADS criteria for biopsy or dedicated follow-up.

_________________________________________________________

Nodule # 5:

Prior biopsy: No

Location: Left; Mid

Maximum size: 1.6 cm; Other 2 dimensions: 1.2 x 0.7 cm, previously,
1.3 x 0.7 x 0.9 cm

Composition: solid/almost completely solid (2)

Echogenicity: hypoechoic (2)

Shape: not taller-than-wide (0)

Margins: ill-defined (0)

Echogenic foci: macrocalcifications (1)

ACR TI-RADS total points: 5.

ACR TI-RADS risk category:  TR4 (4-6 points).

Significant change in size (>/= 20% in two dimensions and minimal
increase of 2 mm): Yes

Change in features: No

Change in ACR TI-RADS risk category: No

ACR TI-RADS recommendations:

**Given size (>/= 1.5 cm) and appearance, fine needle aspiration of
this moderately suspicious nodule should be considered based on
TI-RADS criteria.

_________________________________________________________

Nodule # 6:

Prior biopsy: No

Location: Left; Inferior

Maximum size: 1.1 cm; Other 2 dimensions: 1.0 x 0.8 cm, previously,
1.1 x 1.0 x 1.0 cm

Composition: solid/almost completely solid (2)

Echogenicity: hypoechoic (2)

Shape: not taller-than-wide (0)

Margins: smooth (0)

Echogenic foci: none (0)

ACR TI-RADS total points: 4.

ACR TI-RADS risk category:  TR4 (4-6 points).

Significant change in size (>/= 20% in two dimensions and minimal
increase of 2 mm): No

Change in features: No

Change in ACR TI-RADS risk category: No

ACR TI-RADS recommendations:

*Given size (>/= 1 - 1.4 cm) and appearance, a follow-up ultrasound
in 1 year should be considered based on TI-RADS criteria.

_________________________________________________________

Nodule # 7:

Location: Left; Inferior

Maximum size: 1.0 cm; Other 2 dimensions: 0.7 x 0.6 cm

Composition: solid/almost completely solid (2)

Echogenicity: hypoechoic (2)

Shape: not taller-than-wide (0)

Margins: smooth (0)

Echogenic foci: none (0)

ACR TI-RADS total points: 4.

ACR TI-RADS risk category: TR4 (4-6 points).

ACR TI-RADS recommendations:

*Given size (>/= 1 - 1.4 cm) and appearance, a follow-up ultrasound
in 1 year should be considered based on TI-RADS criteria.

_________________________________________________________

Dominant nodule in the isthmus measures 1.9 x 1.3 x 1.7 cm and
previously measured 1.9 x 1.3 x 1.9 cm. Biopsy was performed
previously on 03/05/2013.
IMPRESSION: Dominant nodule in the isthmus is stable and previously underwent
biopsy.

Nodule 3 is smaller and meets criteria for annual follow-up.

Nodule 5 is larger and now meets criteria for fine needle aspiration
biopsy.

Nodule 6 is stable and meets criteria for annual follow-up.

Nodule 7 is new and meets criteria for annual follow-up.

Other nodules do not meet criteria for follow-up nor biopsy.

The above is in keeping with the ACR TI-RADS recommendations - [HOSPITAL] 2224;[DATE].

## 2018-11-21 IMAGING — US US FNA BIOPSY THYROID 1ST LESION
1 series · 13 of 15 positions shown · non-contrast
Comparison: Thyroid ultrasound dated 07/09/2017

MEDICATIONS:
None

COMPLICATIONS:
None immediate.

INDICATION: Patient with history of multinodular goiter, prior benign isthmus
nodule biopsy in 5517 and recent thyroid ultrasound on 07/09/2017
which revealed multiple thyroid nodules with a slightly enlarging
left mid thyroid nodule which meets criteria for biopsy. She
presents today for needle aspirate biopsy of this left mid thyroid
nodule.

EXAM:
ULTRASOUND GUIDED FINE NEEDLE ASPIRATION BIOPSY OF LEFT MID THYROID
NODULE
TECHNIQUE: Informed written consent was obtained from the patient after a
discussion of the risks, benefits and alternatives to treatment.
Questions regarding the procedure were encouraged and answered. A
timeout was performed prior to the initiation of the procedure.

[Series 1: us fna biopsy thyroid 1st lesion · 0.04mm/px · 15 acquisitions, 13 frames shown]
[im 1/15]
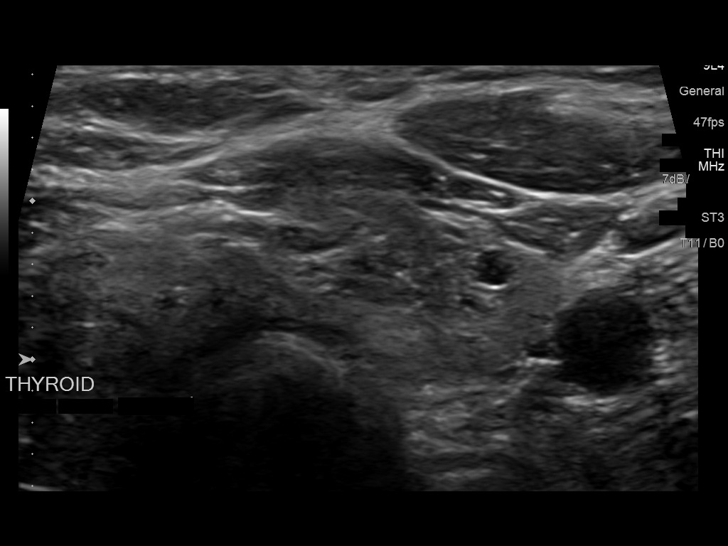
[im 2/15]
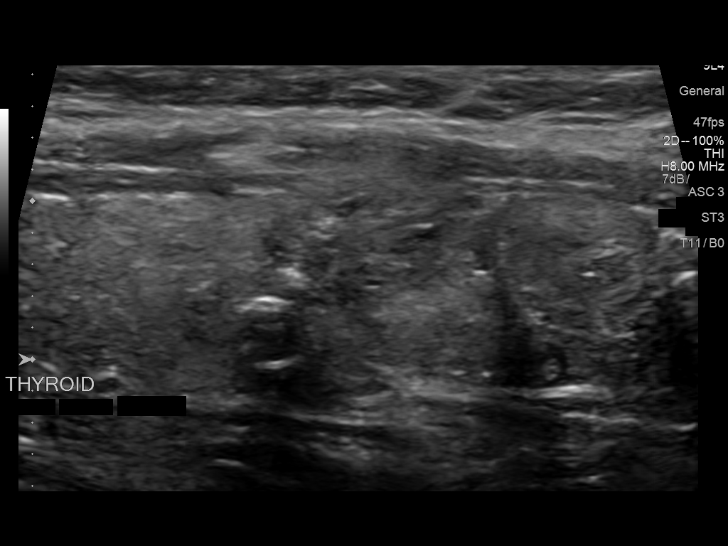
[im 3/15]
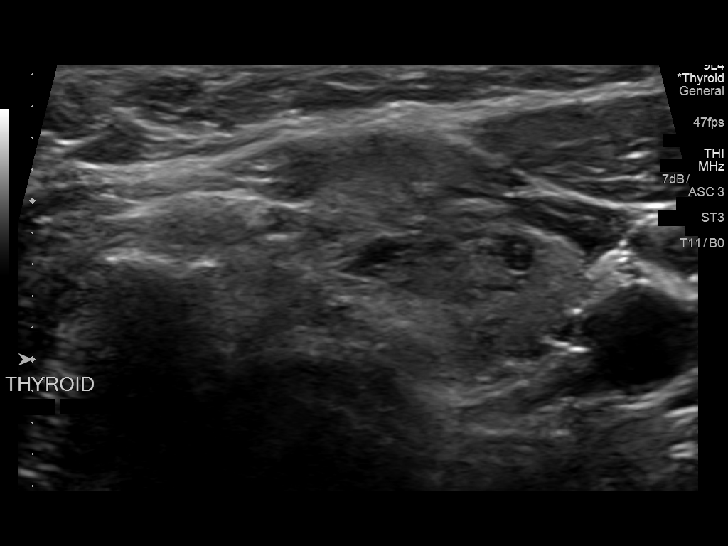
[im 5/15]
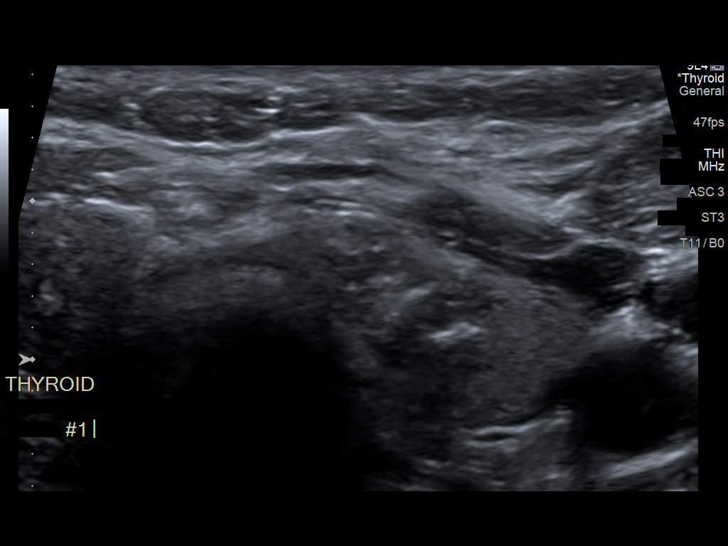
[im 6/15]
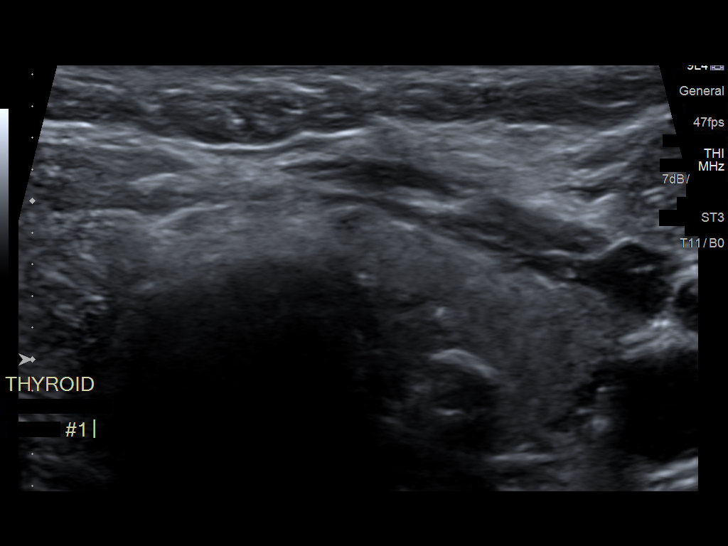
[im 7/15]
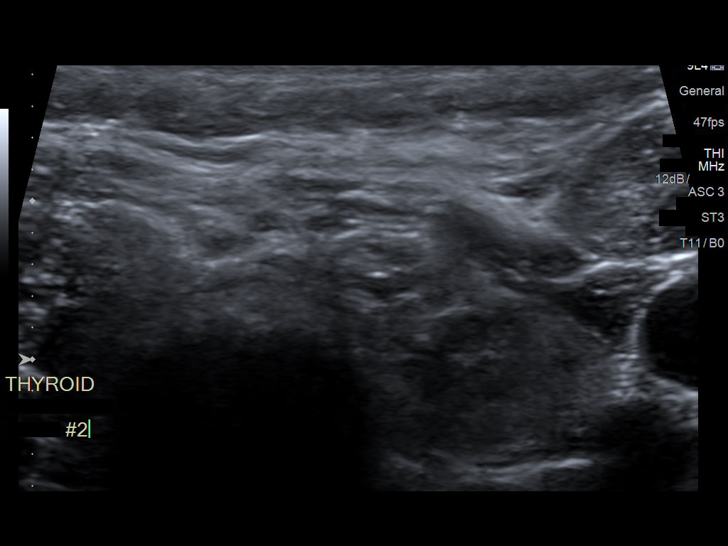
[im 8/15]
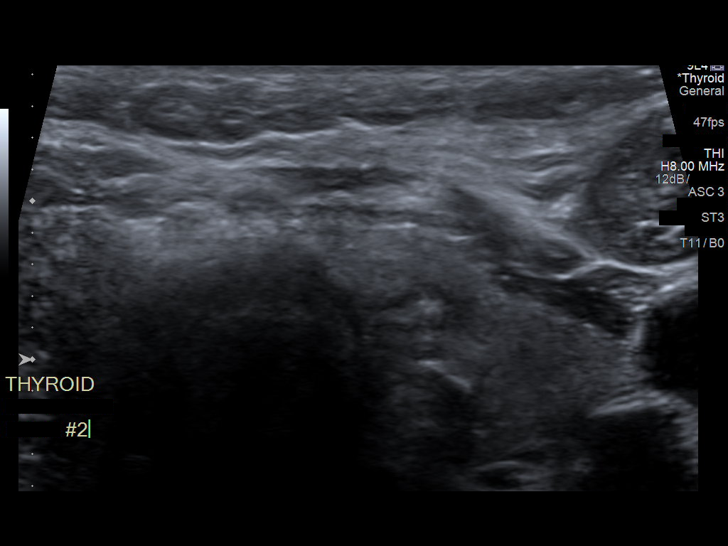
[im 9/15]
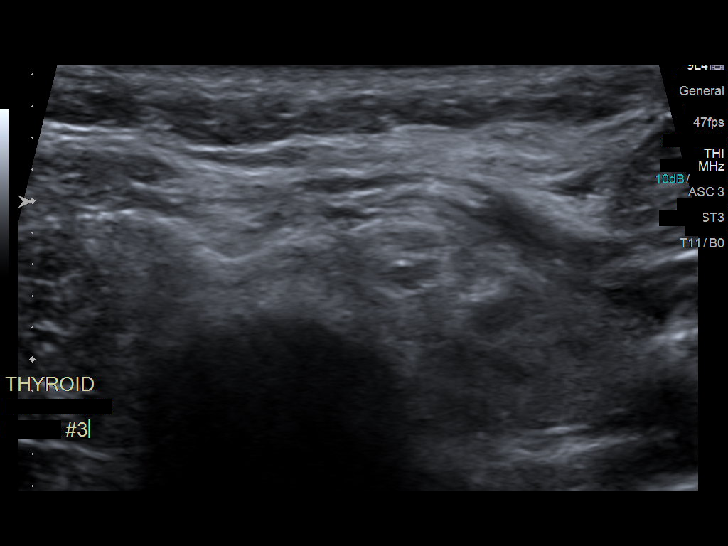
[im 10/15]
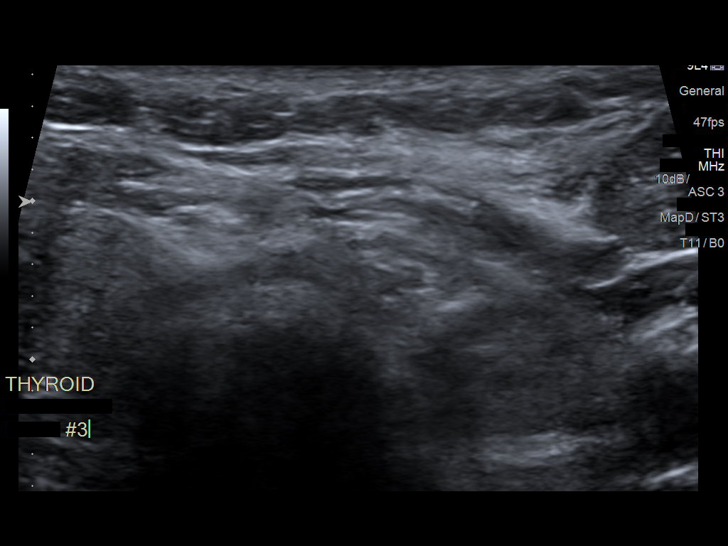
[im 11/15]
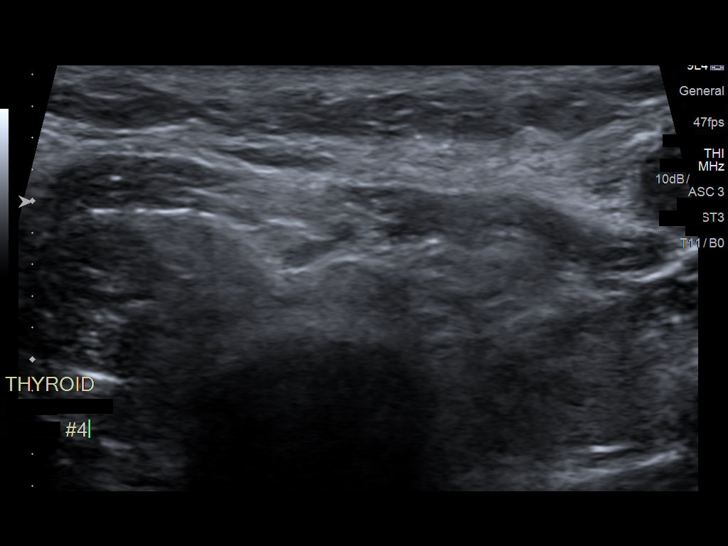
[im 13/15]
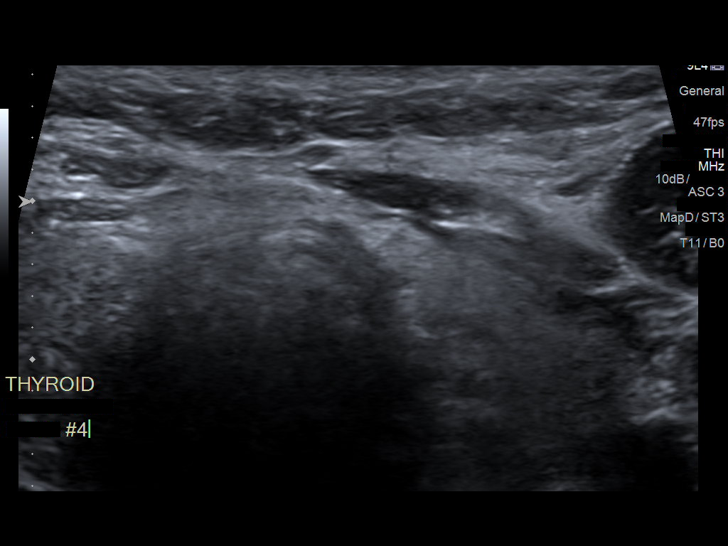
[im 14/15]
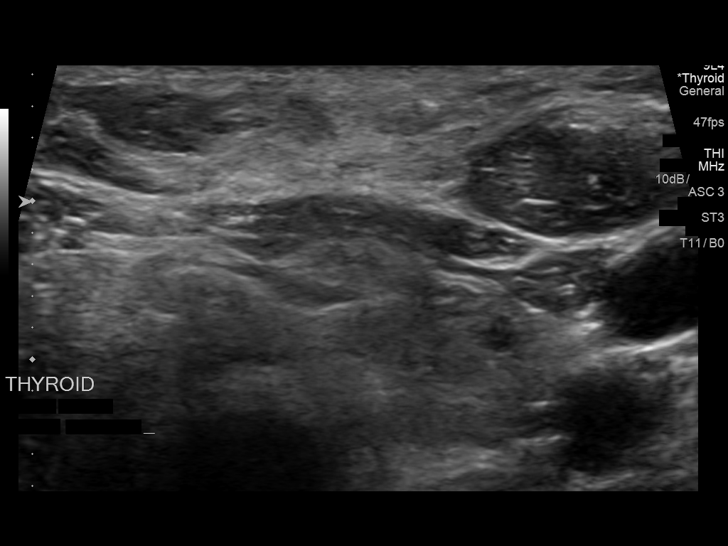
[im 15/15]
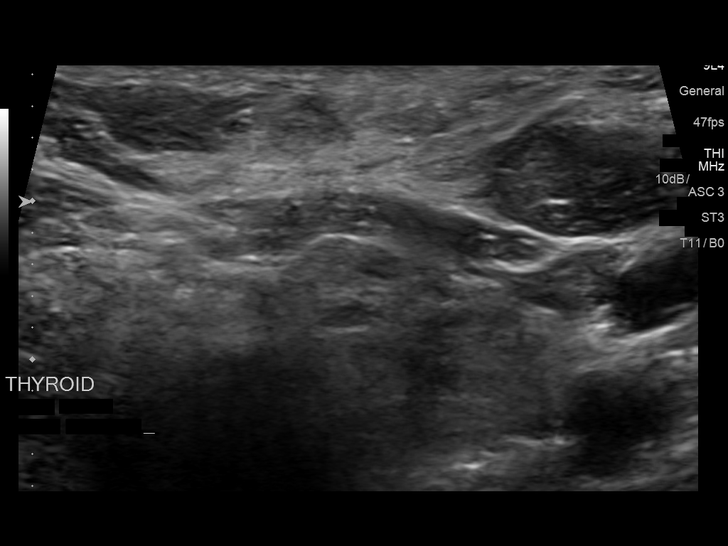

[13 of 15 positions shown; findings below may reference images not displayed]

Pre-procedural ultrasound scanning demonstrated unchanged size and
appearance of the indeterminate nodule within the left mid thyroid
lobe

The procedure was planned. The neck was prepped in the usual sterile
fashion, and a sterile drape was applied covering the operative
field. A timeout was performed prior to the initiation of the
procedure. Local anesthesia was provided with 1% lidocaine.

Under direct ultrasound guidance, 4 FNA biopsies were performed of
the left mid thyroid nodule with 25 gauge needles. Multiple
ultrasound images were saved for procedural documentation purposes.
The samples were prepared and submitted to pathology.

Limited post procedural scanning was negative for hematoma or
additional complication. Dressings were placed. The patient
tolerated the above procedures procedure well without immediate
postprocedural complication.
FINDINGS: Nodule reference number based on prior diagnostic ultrasound: 5

Maximum size: 1.6 cm

Location: Left; Mid

ACR TI-RADS risk category: TR4 (4-6 points)

Reason for biopsy: meets ACR TI-RADS criteria

Ultrasound imaging confirms appropriate placement of the needles
within the thyroid nodule.
IMPRESSION: Technically successful ultrasound guided fine needle aspiration
biopsy of left mid thyroid nodule. Final pathology pending.

## 2019-12-29 ENCOUNTER — Other Ambulatory Visit: Payer: Self-pay | Admitting: Family Medicine

## 2019-12-29 ENCOUNTER — Other Ambulatory Visit: Payer: Self-pay | Admitting: Nurse Practitioner

## 2019-12-29 ENCOUNTER — Other Ambulatory Visit: Payer: Self-pay | Admitting: Interventional Radiology

## 2019-12-29 ENCOUNTER — Other Ambulatory Visit: Payer: Self-pay

## 2019-12-29 DIAGNOSIS — E041 Nontoxic single thyroid nodule: Secondary | ICD-10-CM

## 2020-01-08 ENCOUNTER — Ambulatory Visit
Admission: RE | Admit: 2020-01-08 | Discharge: 2020-01-08 | Disposition: A | Discharge status: Home / Self Care | Payer: 59 | Source: Ambulatory Visit | Attending: Family Medicine | Admitting: Family Medicine

## 2020-01-08 DIAGNOSIS — E041 Nontoxic single thyroid nodule: Secondary | ICD-10-CM

## 2020-02-19 ENCOUNTER — Ambulatory Visit (INDEPENDENT_AMBULATORY_CARE_PROVIDER_SITE_OTHER): Payer: 59

## 2020-02-19 ENCOUNTER — Encounter: Payer: Self-pay | Admitting: Podiatry

## 2020-02-19 ENCOUNTER — Other Ambulatory Visit: Payer: Self-pay

## 2020-02-19 ENCOUNTER — Ambulatory Visit (INDEPENDENT_AMBULATORY_CARE_PROVIDER_SITE_OTHER): Payer: 59 | Admitting: Podiatry

## 2020-02-19 VITALS — Temp 97.3°F

## 2020-02-19 DIAGNOSIS — M722 Plantar fascial fibromatosis: Secondary | ICD-10-CM

## 2020-02-19 NOTE — Patient Instructions (Signed)

## 2020-02-24 NOTE — Progress Notes (Signed)
Subjective:   Patient ID: Tiffany [name], female   DOB: 59 y.o.   MRN: [id number]   HPI Patient states the left heel is very tender and its been going on for a long time but is worsened recently.  States the last 6 months have been worse and it is worse when she gets up in the morning after periods of sitting.  Patient does not smoke likes to be active   Review of Systems  All other systems reviewed and are negative.       Objective:  Physical Exam Vitals and nursing note reviewed.  Constitutional:      Appearance: She is well-developed.  Pulmonary:     Effort: Pulmonary effort is normal.  Musculoskeletal:        General: Normal range of motion.  Skin:    General: Skin is warm.  Neurological:     Mental Status: She is alert.     Neurovascular status intact muscle strength found to be adequate range of motion within normal limits.  Patient is found to have exquisite discomfort plantar aspect left heel insertional point tendon calcaneus inflammation fluid around the medial band with patient found to have good digital perfusion well oriented x3     Assessment:  Acute plantar fasciitis left with inflammation fluid buildup around the insertional point of the tendon into the calcaneus     Plan:  H&P reviewed condition.  Today I did sterile prep injected the plantar fascia at insertion 3 mg Kenalog 5 mg Xylocaine applied fascial brace to lift up the arch gave instructions for supportive shoes and physical therapy and reappoint to recheck also placed him on oral anti-inflammatory agent  X-rays indicate moderate depression of the arch no indications of stress fracture arthritis

## 2020-03-08 ENCOUNTER — Ambulatory Visit: Payer: 59 | Admitting: Podiatry

## 2020-09-14 ENCOUNTER — Ambulatory Visit: Payer: 59 | Admitting: Internal Medicine

## 2020-10-28 ENCOUNTER — Encounter: Payer: Self-pay | Admitting: Internal Medicine

## 2020-10-28 ENCOUNTER — Other Ambulatory Visit: Payer: Self-pay

## 2020-10-28 ENCOUNTER — Ambulatory Visit (INDEPENDENT_AMBULATORY_CARE_PROVIDER_SITE_OTHER): Payer: Managed Care, Other (non HMO) | Admitting: Internal Medicine

## 2020-10-28 VITALS — BP 161/84 | HR 87 | Temp 98.0°F | Ht 66.0 in | Wt 141.0 lb

## 2020-10-28 DIAGNOSIS — R509 Fever, unspecified: Secondary | ICD-10-CM | POA: Diagnosis not present

## 2020-10-28 NOTE — Patient Instructions (Addendum)
We are starting fever of unknown origin workup, which include: 1)blood tests for inflammatory markers, autoimmune disease, chronic infection, and blood smear for malaria  2) if this is unrevealing and you still have chills or fever, will get chest,abd, and pelvis ct scan    Please keep a journal of your fever and temperature and heart rate each day  Follow up with me in 3-4 weeks  Avoid any other antibiotics

## 2020-10-28 NOTE — Progress Notes (Signed)
[facility]  Reason for Consult:chill Referring Provider: Kathyrn Lass    There are no problems to display for this patient.     HPI: Tiffany [name] is a 60 y.o. female otherwise healthy, referred by pcp for "chill"   Patient sx started with chills early-mid 08/2020. She would have hot flash upper trunk/neck including sweat while sitting. She felt sensitive to cold and would get more jackets/layers than other people. And also would feel chill. Denies rigor and fever; she checked her temperature. It was constant for 7-10 days then it got better (came back for few days on 2 other occasions). She works from home. She recalled going to the gym when she feels it. The last time she felt these episodes exactly 2 weeks ago from today 5/12 (or 17-18 days ago). She is back to normal.   During these chill/hot flush episodes doesn't recall any other associated sx, included: Weight loss Headache, visual change, dizziness, gait disturbance Cough, sorethroat, runny nose (have seasonal allergy) Chest pain, short of breath, abd pain, n/v/diarrhea Rash Joint pain  Muscle pain Numbness/tingling Easy bruising or bleeding  She described some decrease appetite (does intermittent fasting). Back to normal now  No episodic headache/high blood pressure   She no longer has menstruation. Her last menstrual cycle was at age 82. She doesn't recall any hot flush or chills.    She took synthroid for a long time by her PCP but very small dose. She last took 2-3 years ago.  Saw to dentist and noted some left sided face pain and given amox-clav She subsequently saw ENT who did a sinus xray and said it was 'clear' no evidence of infection.   She was given 2 courses of amox-clav initially 4 days then 10 days by pcp/ent. After she finished the 2nd course, she had 1 episode of the chill/hot flush    ID epi: Born in [place] Wasn't around any animals growing up Came to the [place] at  age [age] No recent travel except [place]; no visitors from [place] She visited [place] 03/2020. She was there for 10 days --> [place], [place], and [employer]. [place]; she noted her dish was washed in the river water. No direct water contact. No jungle hiking. Stayed in cities and hotels mostly. She ate some street food (cooked hot; no ice water, only bottled water). Got mosquitoes bites on the cruise. Didn't take any prophylaxis. Wasn't vaccinated for yellow fever. Previous travel [place], [place], [place], [place], [place] She is a Designer, multimedia for medical billing/coding No consumption of unpasteurized dairy products Doesn't live around farms. Has an animal phobia.     Review of Systems: ROS All other Ros negative      PMH: No known medical problems outside of seasonal allergy   Social History   Tobacco Use  . Smoking status: Never Smoker  . Smokeless tobacco: Never Used    Family History  Problem Relation Age of Onset  . Cancer Other     No Known Allergies  OBJECTIVE: There were no vitals filed for this visit. There is no height or weight on file to calculate BMI.   Physical Exam General/constitutional: no distress, pleasant HEENT: Normocephalic, PER, Conj Clear, EOMI, Oropharynx clear Neck supple CV: rrr no mrg Lungs: clear to auscultation, normal respiratory effort Abd: Soft, Nontender Ext: no edema Skin: No Rash Neuro: nonfocal MSK: no  peripheral joint swelling/tenderness/warmth; back spines nontender Psych alert/oriented  Lab: Reviewed outside labs.... 3/21 cbc normal except mcv 75 and lymphocyte at 3100 Cr normal Urine dip normal  Microbiology: No blood cx  Serology:  Imaging: cxr 3/22 outside facility no active disease  Assessment/plan: Problem List Items Addressed This Visit   None   Visit Diagnoses    Fever, unknown origin    -  Primary   Relevant Orders   Ferritin   Sedimentation rate    C-reactive protein   Antinuclear Antib (ANA)   Antiextractable Nuclear Ag   CBC w/Diff   Comprehensive metabolic panel   Urine Microscopic Only   Urinalysis   Rheumatoid Factor   Malaria Smear   Cortisol   Thyroid Panel With TSH   HIV antibody (with reflex)   QuantiFERON-TB Gold Plus   Coccidioides Antibodies   Histoplasma Antibodies   Blastomyces Antigen        What she described to me doesn't sound like an infectious syndrome but rather a hormonal process. Her symptomatology has several overlaps with menopause estrogen response. To play the devil's advocate. If one looks at [place] as the exposure, the 3 things that came to my mind is malaria, tick born relapsing/reactivation, or katayama fever from schistosomiasis. Certainly chronic infection such as tb/fungal can present in different ways including chronic chills/fever.  She doesn't reveal much in terms of rheumatologic. She has native [place] origin so certainly behcets is inconsideration and will keep that in the back burner  Previous blood smear benign. I don't suspect a hematologic process  -thyroid/cortisole -urine microscopy -fungal/quant gold serology -malaria blood smear -ferritin/esr/crp -cbc differential/lft    I spent 60 minute reviewing data/chart, and coordinating care and >50% direct face to face time providing counseling/discussing diagnostics/treatment plan with patient    Follow-up: Return in about 4 weeks (around 11/25/2020) for with dr Gale Journey.  Jabier Mutton, [facility] [phone] pager   [phone] cell 10/28/2020, 1:54 PM

## 2021-11-17 DIAGNOSIS — C539 Malignant neoplasm of cervix uteri, unspecified: Secondary | ICD-10-CM

## 2021-11-17 HISTORY — DX: Malignant neoplasm of cervix uteri, unspecified: C53.9

## 2021-12-28 ENCOUNTER — Other Ambulatory Visit: Payer: Self-pay | Admitting: Obstetrics and Gynecology

## 2021-12-28 DIAGNOSIS — N950 Postmenopausal bleeding: Secondary | ICD-10-CM

## 2021-12-29 ENCOUNTER — Encounter: Payer: Self-pay | Admitting: Gynecologic Oncology

## 2021-12-29 ENCOUNTER — Other Ambulatory Visit: Payer: Self-pay | Admitting: Obstetrics and Gynecology

## 2021-12-29 ENCOUNTER — Ambulatory Visit
Admission: RE | Admit: 2021-12-29 | Discharge: 2021-12-29 | Disposition: A | Discharge status: Home / Self Care | Payer: No Typology Code available for payment source | Source: Ambulatory Visit | Attending: Obstetrics and Gynecology | Admitting: Obstetrics and Gynecology

## 2021-12-29 DIAGNOSIS — N950 Postmenopausal bleeding: Secondary | ICD-10-CM

## 2021-12-29 MED ORDER — IOPAMIDOL (ISOVUE-300) INJECTION 61%
100.0000 mL | Freq: Once | INTRAVENOUS | Status: AC | PRN
  Administered 2021-12-29: 100 mL via INTRAVENOUS

## 2021-12-29 NOTE — H&P (View-Only) (Signed)
GYNECOLOGIC ONCOLOGY NEW PATIENT CONSULTATION   Patient Name: Tiffany [name]  Patient Age: 61 y.o. Date of Service: 12/30/21 Referring Provider: Dr. Molli Posey  Primary Care Provider: Lawerance Cruel, MD Consulting Provider: Jeral Pinch, MD   Assessment/Plan:  Postmenopausal patient with what I suspect is a large prolapsing uterine fibroid, bilateral complex adnexal masses, elevated CA-125.  Discussed with patient findings on recent CT scan.  We reviewed the images together.  These show bilateral adnexal masses, mostly cystic, that are new since last imaging in 2015. The patient had a recent CA-125 which was elevated.  We discussed that this is not a diagnostic test but rather 1 that is useful in helping to determine who should perform surgery.  We discussed that in up to 50% of patients with early stage ovarian cancer, this tumor marker is normal.  There are also many noncancerous causes for its elevation including endometriosis and uterine fibroids.  Given the size and appearance of these masses, I recommend that we proceed with surgical excision.  I had originally suggested endometrial biopsy given her bleeding.  Given her difficulty with exams, her preference was to perform this at the time of surgery.  However, on exam today, the patient has a very large prolapsing mass filling the upper vagina which I suspect is a degenerated fibroid.  Biopsy was taken of this today.  I will call the patient once I have these results.  On imaging, I do not see any involvement of the rectum.  There is some mass effect on exam, but the rectum does not feel involved.  I spoke with the reading radiologist about additional imaging to delineate this.  I have recommended that we proceed with pelvic MRI prior to surgery.  I am hoping that this will both better delineate the anatomy of the cervix itself but also any possible bowel involvement.  Tentative plan that we discussed is for robotic assisted  hysterectomy, bilateral salpingo-oophorectomy, possible staging including lymph node dissection, possible laparotomy.  We discussed plan for frozen section at surgery.  Both ovaries will be removed, placed in an Endo Catch bags, decompressed in a contained fashion, and sent to pathology.  If benign, and the prolapsing mass is also benign, then only total hysterectomy and BSO will be performed.  If borderline tumor identified, then we discussed additional procedures including peritoneal biopsies and omentectomy.  In the setting of malignancy, we discussed visional staging procedures including lymph node dissection.  The risks of surgery were discussed in detail and she understands these to include infection; wound separation; hernia; vaginal cuff separation, injury to adjacent organs such as bowel, bladder, blood vessels, ureters and nerves; bleeding which may require blood transfusion; anesthesia risk; thromboembolic events; possible death; unforeseen complications; possible need for re-exploration; medical complications such as heart attack, stroke, pleural effusion and pneumonia; and, if full lymphadenectomy is performed the risk of lymphedema and lymphocyst. The patient will receive DVT and antibiotic prophylaxis as indicated. She voiced a clear understanding. She had the opportunity to ask questions. Perioperative instructions were reviewed with her. Prescriptions for post-op medications were sent to her pharmacy of choice.  A copy of this note was sent to the patient's referring provider.   70 minutes of total time was spent for this patient encounter, including preparation, face-to-face counseling with the patient and coordination of care, and documentation of the encounter.   Jeral Pinch, MD  Division of Gynecologic Oncology  Department of Obstetrics and Gynecology  [facility]  ___________________________________________  Chief Complaint: No chief complaint on  file.   History of Present Illness:  Tiffany [name] is a 61 y.o. y.o. female who is seen in consultation at the request of Dr. Matthew Saras for an evaluation of bilateral adnexal masses.  Patient reports several month history of intermittent pelvic pain that she describes as discomfort or sometimes cramping, that has worsened with time.  She notes that this is tolerable.  She began having postmenopausal bleeding about 25 days ago.  This started on 6/17 with a few spots of blood.  She has had 2-3 episodes since then, 2 of which were heavier like a menses.  She also notes having clear/watery discharge with an odor over the last month or so.  She has decreased appetite secondary to anxiety.  She denies any nausea or emesis.  She reports normal bowel function.  She usually drinks a lot of water but recently has felt that she becomes bloated if she drinks too much water.  She denies any early satiety.  She has a history of uterine fibroids. Pelvic ultrasound exam from 06/2013 shows a uterus measuring 10.6 x 6.9 x 10.5 cm with an exophytic hypoechoic mass along the right aspect measuring up to 6.4 cm.  Smaller 6 mm fibroid seen within the anterior uterine body.  Endometrium is 18 mm and slightly heterogenous.  Left ovary normal in appearance, right contains a 3.1 cm anechoic cystic lesion with increased through-transmission.  She was seen by an OB/GYN in June when she developed postmenopausal bleeding and discharge.  She underwent a pelvic ultrasound exam and definitive surgery was recommended.  Pelvic ultrasound at [facility] on 12/08/2021 shows a uterus measuring 9.64 x 3.3 x 5 cm with an endometrial lining of 2.1 mm.  Right ovary measures 7.1 x 3.5 x 4.8 cm, left measures 10.4 x 7.4 x 7.1 cm.  Transvaginal exam could not be tolerated.  Lower uterine segment mass versus fibroid noted to measure up to 7.2 cm.  Right ovary enlarged with 2 cysts, one measuring up to 4.7 cm the other up to 2.8 cm, both avascular.  Left  ovary enlarged with 2 cysts, some internal echoes, largest measuring up to 8 cm and smaller up to 2.9 cm.  Both avascular.  She then sought a second opinion more recently with another OB/GYN.  At that visit, she had CA-125 drawn which was 627 (on 12/26/21).  CT A/P on 12/29/21 showed bilateral large multiloculated cystic lesions of the ovary.  Large heterogenous mass in the posterior pelvis likely arising from the lower uterus, possibly a fibroid is noted.  Additional mass arising from the right uterine fundus which correlates with fibroid described on ultrasound in 2015.  Nonspecific small groundglass nodule of the right lower lobe of the lung measures 6 mm, recommend follow-up chest CT in 6 months.   PAST MEDICAL HISTORY:  Past Medical History:  Diagnosis Date   Asthma    history of, not a current problem   Hypothyroidism    not on medications currently     PAST SURGICAL HISTORY:  Past Surgical History:  Procedure Laterality Date   CESAREAN SECTION     DILATION AND CURETTAGE OF UTERUS     61yo for SAB    OB/GYN HISTORY:  OB History  Gravida Para Term Preterm AB Living  '2 1       1  '$ SAB IAB Ectopic Multiple Live Births               #  Outcome Date GA Lbr Len/2nd Weight Sex Delivery Anes PTL Lv  2 Gravida           1 Para             No LMP recorded. Patient is postmenopausal.  Age at menarche: 52 Age at menopause: 27 Hx of HRT: Denies Hx of STDs: no Last pap: 11/2021 - normal History of abnormal pap smears: no  SCREENING STUDIES:  Last mammogram: 03/2021  Last colonoscopy: 5-6 years ago, reports that she was recently called to schedule her next colonoscopy  MEDICATIONS: Outpatient Encounter Medications as of 12/30/2021  Medication Sig   ALPRAZolam (XANAX) 0.25 MG tablet Take 0.25 mg by mouth 3 (three) times daily as needed for anxiety. Every 8 hours   ibuprofen (ADVIL) 800 MG tablet Take 1 tablet (800 mg total) by mouth every 8 (eight) hours as needed for moderate  pain. For AFTER surgery only.   senna-docusate (SENOKOT-S) 8.6-50 MG tablet Take 2 tablets by mouth at bedtime. For AFTER surgery, do not take if having diarrhea   traMADol (ULTRAM) 50 MG tablet Take 1 tablet (50 mg total) by mouth every 6 (six) hours as needed for severe pain. For AFTER surgery only, do not take and drive   [DISCONTINUED] Calcium Carbonate+Vitamin D (CALCIUM 600+D3) 600-200 MG-UNIT TABS Take 1 tablet by mouth daily.   [DISCONTINUED] Glucosamine HCl (GLUCOSAMINE PO) Take 1 tablet by mouth daily.   [DISCONTINUED] Multiple Vitamin (MULTIVITAMIN PO) Take 1 tablet by mouth daily.   [DISCONTINUED] Omega-3 Fatty Acids (FISH OIL PO) Take 1 tablet by mouth daily.   [DISCONTINUED] TURMERIC PO Take 1 tablet by mouth daily.   [DISCONTINUED] VITAMIN D PO Take 1 tablet by mouth daily.   No facility-administered encounter medications on file as of 12/30/2021.    ALLERGIES:  No Known Allergies   FAMILY HISTORY:  Family History  Problem Relation Age of Onset   Thyroid disease Mother    Breast cancer Mother    Cancer Other    Colon cancer Neg Hx    Ovarian cancer Neg Hx    Endometrial cancer Neg Hx    Pancreatic cancer Neg Hx    Prostate cancer Neg Hx      SOCIAL HISTORY:  Social Connections: Not on file    REVIEW OF SYSTEMS:  + Decreased appetite, abdominal pain, menstrual problems, pelvic pain, vaginal bleeding, vaginal discharge, anxiety. Denies fevers, chills, fatigue, unexplained weight changes. Denies hearing loss, neck lumps or masses, mouth sores, ringing in ears or voice changes. Denies cough or wheezing.  Denies shortness of breath. Denies chest pain or palpitations. Denies leg swelling. Denies abdominal distention, blood in stools, constipation, diarrhea, nausea, vomiting, or early satiety. Denies pain with intercourse, dysuria, frequency, hematuria or incontinence. Denies joint pain, back pain or muscle pain/cramps. Denies itching, rash, or wounds. Denies  dizziness, headaches, numbness or seizures. Denies swollen lymph nodes or glands, denies easy bruising or bleeding. Denies depression, confusion, or decreased concentration.  Physical Exam:  Vital Signs for this encounter:  Blood pressure (!) 156/102, pulse 92, temperature 98.2 F (36.8 C), temperature source Oral, resp. rate 18, height '5\' 3"'$  (1.6 m), weight 124 lb (56.2 kg), SpO2 100 %. Body mass index is 21.97 kg/m. General: Alert, oriented, no acute distress.  HEENT: Normocephalic, atraumatic. Sclera anicteric.  Chest: Clear to auscultation bilaterally. No wheezes, rhonchi, or rales. Cardiovascular: Regular rate and rhythm, no murmurs, rubs, or gallops.  Abdomen: Normoactive bowel sounds. Soft, nondistended, nontender to palpation.  Fullness appreciated within the pelvis. No palpable fluid wave.  Extremities: Grossly normal range of motion. Warm, well perfused. No edema bilaterally.  Skin: No rashes or lesions.  Lymphatics: No cervical, supraclavicular, or inguinal adenopathy.  GU:  Normal external female genitalia.  No lesions. No discharge or bleeding.             Bladder/urethra:  No lesions or masses, well supported bladder             Vagina: Exam tolerated moderately.  Vaginal mucosa moderately atrophic.  With a small speculum, necrotic appearing mass noted within the upper vagina.  Cervix not able to be visualized.             Cervix: Not visualized.  On bimanual exam, there is a 8+ centimeter mass filling the upper vagina, that I suspect is prolapsing through the cervix.  Given patient's discomfort as well as size of the mass, I am unable to appreciate the cervix itself.             Uterus: Mobile although exam is somewhat limited by prolapsing mass, no parametrial involvement or nodularity.             Adnexa: Better appreciated abdominally secondary to prolapsing mass filling the vagina.  Rectal: No involvement of the rectum but significant mass effect by the mass within the  vaginal vault.  Prolapsing mass biopsy Preoperative diagnosis: Prolapsing mass suspected degenerating fibroid Postoperative diagnosis: Same as above Procedures performed: prolapsing mass biopsy Estimated blood loss: Minimal Specimen: prolapsing mass biopsy Procedure: After the procedure was discussed with the patient, she gave verbal consent.  She was already in dorsolithotomy position with findings as noted above.  The mass was cleansed with Betadine x3 and Tischler forceps were used to take a biopsy of this.  Biopsy was placed in formalin.  Patient tolerated the procedure well.  Hemostasis was achieved with silver nitrate.  All instruments removed from the vagina.  LABORATORY AND RADIOLOGIC DATA:  Outside medical records were reviewed to synthesize the above history, along with the history and physical obtained during the visit.   No results found for: "WBC", "HGB", "HCT", "PLT", "GLUCOSE", "CHOL", "TRIG", "HDL", "LDLDIRECT", "[place]", "ALT", "AST", "NA", "K", "CL", "CREATININE", "BUN", "CO2", "TSH", "PSA", "INR", "GLUF", "HGBA1C", "MICROALBUR"

## 2021-12-29 NOTE — Progress Notes (Signed)
GYNECOLOGIC ONCOLOGY NEW PATIENT CONSULTATION   Patient Name: Tiffany [name]  Patient Age: 61 y.o. Date of Service: 12/30/21 Referring Provider: Dr. Molli Posey  Primary Care Provider: Lawerance Cruel, MD Consulting Provider: Jeral Pinch, MD   Assessment/Plan:  Postmenopausal patient with what I suspect is a large prolapsing uterine fibroid, bilateral complex adnexal masses, elevated CA-125.  Discussed with patient findings on recent CT scan.  We reviewed the images together.  These show bilateral adnexal masses, mostly cystic, that are new since last imaging in 2015. The patient had a recent CA-125 which was elevated.  We discussed that this is not a diagnostic test but rather 1 that is useful in helping to determine who should perform surgery.  We discussed that in up to 50% of patients with early stage ovarian cancer, this tumor marker is normal.  There are also many noncancerous causes for its elevation including endometriosis and uterine fibroids.  Given the size and appearance of these masses, I recommend that we proceed with surgical excision.  I had originally suggested endometrial biopsy given her bleeding.  Given her difficulty with exams, her preference was to perform this at the time of surgery.  However, on exam today, the patient has a very large prolapsing mass filling the upper vagina which I suspect is a degenerated fibroid.  Biopsy was taken of this today.  I will call the patient once I have these results.  On imaging, I do not see any involvement of the rectum.  There is some mass effect on exam, but the rectum does not feel involved.  I spoke with the reading radiologist about additional imaging to delineate this.  I have recommended that we proceed with pelvic MRI prior to surgery.  I am hoping that this will both better delineate the anatomy of the cervix itself but also any possible bowel involvement.  Tentative plan that we discussed is for robotic assisted  hysterectomy, bilateral salpingo-oophorectomy, possible staging including lymph node dissection, possible laparotomy.  We discussed plan for frozen section at surgery.  Both ovaries will be removed, placed in an Endo Catch bags, decompressed in a contained fashion, and sent to pathology.  If benign, and the prolapsing mass is also benign, then only total hysterectomy and BSO will be performed.  If borderline tumor identified, then we discussed additional procedures including peritoneal biopsies and omentectomy.  In the setting of malignancy, we discussed visional staging procedures including lymph node dissection.  The risks of surgery were discussed in detail and she understands these to include infection; wound separation; hernia; vaginal cuff separation, injury to adjacent organs such as bowel, bladder, blood vessels, ureters and nerves; bleeding which may require blood transfusion; anesthesia risk; thromboembolic events; possible death; unforeseen complications; possible need for re-exploration; medical complications such as heart attack, stroke, pleural effusion and pneumonia; and, if full lymphadenectomy is performed the risk of lymphedema and lymphocyst. The patient will receive DVT and antibiotic prophylaxis as indicated. She voiced a clear understanding. She had the opportunity to ask questions. Perioperative instructions were reviewed with her. Prescriptions for post-op medications were sent to her pharmacy of choice.  A copy of this note was sent to the patient's referring provider.   70 minutes of total time was spent for this patient encounter, including preparation, face-to-face counseling with the patient and coordination of care, and documentation of the encounter.   Jeral Pinch, MD  Division of Gynecologic Oncology  Department of Obstetrics and Gynecology  [facility]  ___________________________________________  Chief Complaint: No chief complaint on  file.   History of Present Illness:  Tiffany [name] is a 61 y.o. y.o. female who is seen in consultation at the request of Dr. Matthew Saras for an evaluation of bilateral adnexal masses.  Patient reports several month history of intermittent pelvic pain that she describes as discomfort or sometimes cramping, that has worsened with time.  She notes that this is tolerable.  She began having postmenopausal bleeding about 25 days ago.  This started on 6/17 with a few spots of blood.  She has had 2-3 episodes since then, 2 of which were heavier like a menses.  She also notes having clear/watery discharge with an odor over the last month or so.  She has decreased appetite secondary to anxiety.  She denies any nausea or emesis.  She reports normal bowel function.  She usually drinks a lot of water but recently has felt that she becomes bloated if she drinks too much water.  She denies any early satiety.  She has a history of uterine fibroids. Pelvic ultrasound exam from 06/2013 shows a uterus measuring 10.6 x 6.9 x 10.5 cm with an exophytic hypoechoic mass along the right aspect measuring up to 6.4 cm.  Smaller 6 mm fibroid seen within the anterior uterine body.  Endometrium is 18 mm and slightly heterogenous.  Left ovary normal in appearance, right contains a 3.1 cm anechoic cystic lesion with increased through-transmission.  She was seen by an OB/GYN in June when she developed postmenopausal bleeding and discharge.  She underwent a pelvic ultrasound exam and definitive surgery was recommended.  Pelvic ultrasound at [facility] on 12/08/2021 shows a uterus measuring 9.64 x 3.3 x 5 cm with an endometrial lining of 2.1 mm.  Right ovary measures 7.1 x 3.5 x 4.8 cm, left measures 10.4 x 7.4 x 7.1 cm.  Transvaginal exam could not be tolerated.  Lower uterine segment mass versus fibroid noted to measure up to 7.2 cm.  Right ovary enlarged with 2 cysts, one measuring up to 4.7 cm the other up to 2.8 cm, both avascular.  Left  ovary enlarged with 2 cysts, some internal echoes, largest measuring up to 8 cm and smaller up to 2.9 cm.  Both avascular.  She then sought a second opinion more recently with another OB/GYN.  At that visit, she had CA-125 drawn which was 627 (on 12/26/21).  CT A/P on 12/29/21 showed bilateral large multiloculated cystic lesions of the ovary.  Large heterogenous mass in the posterior pelvis likely arising from the lower uterus, possibly a fibroid is noted.  Additional mass arising from the right uterine fundus which correlates with fibroid described on ultrasound in 2015.  Nonspecific small groundglass nodule of the right lower lobe of the lung measures 6 mm, recommend follow-up chest CT in 6 months.   PAST MEDICAL HISTORY:  Past Medical History:  Diagnosis Date   Asthma    history of, not a current problem   Hypothyroidism    not on medications currently     PAST SURGICAL HISTORY:  Past Surgical History:  Procedure Laterality Date   CESAREAN SECTION     DILATION AND CURETTAGE OF UTERUS     61yo for SAB    OB/GYN HISTORY:  OB History  Gravida Para Term Preterm AB Living  '2 1       1  '$ SAB IAB Ectopic Multiple Live Births               #  Outcome Date GA Lbr Len/2nd Weight Sex Delivery Anes PTL Lv  2 Gravida           1 Para             No LMP recorded. Patient is postmenopausal.  Age at menarche: 23 Age at menopause: 29 Hx of HRT: Denies Hx of STDs: no Last pap: 11/2021 - normal History of abnormal pap smears: no  SCREENING STUDIES:  Last mammogram: 03/2021  Last colonoscopy: 5-6 years ago, reports that she was recently called to schedule her next colonoscopy  MEDICATIONS: Outpatient Encounter Medications as of 12/30/2021  Medication Sig   ALPRAZolam (XANAX) 0.25 MG tablet Take 0.25 mg by mouth 3 (three) times daily as needed for anxiety. Every 8 hours   ibuprofen (ADVIL) 800 MG tablet Take 1 tablet (800 mg total) by mouth every 8 (eight) hours as needed for moderate  pain. For AFTER surgery only.   senna-docusate (SENOKOT-S) 8.6-50 MG tablet Take 2 tablets by mouth at bedtime. For AFTER surgery, do not take if having diarrhea   traMADol (ULTRAM) 50 MG tablet Take 1 tablet (50 mg total) by mouth every 6 (six) hours as needed for severe pain. For AFTER surgery only, do not take and drive   [DISCONTINUED] Calcium Carbonate+Vitamin D (CALCIUM 600+D3) 600-200 MG-UNIT TABS Take 1 tablet by mouth daily.   [DISCONTINUED] Glucosamine HCl (GLUCOSAMINE PO) Take 1 tablet by mouth daily.   [DISCONTINUED] Multiple Vitamin (MULTIVITAMIN PO) Take 1 tablet by mouth daily.   [DISCONTINUED] Omega-3 Fatty Acids (FISH OIL PO) Take 1 tablet by mouth daily.   [DISCONTINUED] TURMERIC PO Take 1 tablet by mouth daily.   [DISCONTINUED] VITAMIN D PO Take 1 tablet by mouth daily.   No facility-administered encounter medications on file as of 12/30/2021.    ALLERGIES:  No Known Allergies   FAMILY HISTORY:  Family History  Problem Relation Age of Onset   Thyroid disease Mother    Breast cancer Mother    Cancer Other    Colon cancer Neg Hx    Ovarian cancer Neg Hx    Endometrial cancer Neg Hx    Pancreatic cancer Neg Hx    Prostate cancer Neg Hx      SOCIAL HISTORY:  Social Connections: Not on file    REVIEW OF SYSTEMS:  + Decreased appetite, abdominal pain, menstrual problems, pelvic pain, vaginal bleeding, vaginal discharge, anxiety. Denies fevers, chills, fatigue, unexplained weight changes. Denies hearing loss, neck lumps or masses, mouth sores, ringing in ears or voice changes. Denies cough or wheezing.  Denies shortness of breath. Denies chest pain or palpitations. Denies leg swelling. Denies abdominal distention, blood in stools, constipation, diarrhea, nausea, vomiting, or early satiety. Denies pain with intercourse, dysuria, frequency, hematuria or incontinence. Denies joint pain, back pain or muscle pain/cramps. Denies itching, rash, or wounds. Denies  dizziness, headaches, numbness or seizures. Denies swollen lymph nodes or glands, denies easy bruising or bleeding. Denies depression, confusion, or decreased concentration.  Physical Exam:  Vital Signs for this encounter:  Blood pressure (!) 156/102, pulse 92, temperature 98.2 F (36.8 C), temperature source Oral, resp. rate 18, height '5\' 3"'$  (1.6 m), weight 124 lb (56.2 kg), SpO2 100 %. Body mass index is 21.97 kg/m. General: Alert, oriented, no acute distress.  HEENT: Normocephalic, atraumatic. Sclera anicteric.  Chest: Clear to auscultation bilaterally. No wheezes, rhonchi, or rales. Cardiovascular: Regular rate and rhythm, no murmurs, rubs, or gallops.  Abdomen: Normoactive bowel sounds. Soft, nondistended, nontender to palpation.  Fullness appreciated within the pelvis. No palpable fluid wave.  Extremities: Grossly normal range of motion. Warm, well perfused. No edema bilaterally.  Skin: No rashes or lesions.  Lymphatics: No cervical, supraclavicular, or inguinal adenopathy.  GU:  Normal external female genitalia.  No lesions. No discharge or bleeding.             Bladder/urethra:  No lesions or masses, well supported bladder             Vagina: Exam tolerated moderately.  Vaginal mucosa moderately atrophic.  With a small speculum, necrotic appearing mass noted within the upper vagina.  Cervix not able to be visualized.             Cervix: Not visualized.  On bimanual exam, there is a 8+ centimeter mass filling the upper vagina, that I suspect is prolapsing through the cervix.  Given patient's discomfort as well as size of the mass, I am unable to appreciate the cervix itself.             Uterus: Mobile although exam is somewhat limited by prolapsing mass, no parametrial involvement or nodularity.             Adnexa: Better appreciated abdominally secondary to prolapsing mass filling the vagina.  Rectal: No involvement of the rectum but significant mass effect by the mass within the  vaginal vault.  Prolapsing mass biopsy Preoperative diagnosis: Prolapsing mass suspected degenerating fibroid Postoperative diagnosis: Same as above Procedures performed: prolapsing mass biopsy Estimated blood loss: Minimal Specimen: prolapsing mass biopsy Procedure: After the procedure was discussed with the patient, she gave verbal consent.  She was already in dorsolithotomy position with findings as noted above.  The mass was cleansed with Betadine x3 and Tischler forceps were used to take a biopsy of this.  Biopsy was placed in formalin.  Patient tolerated the procedure well.  Hemostasis was achieved with silver nitrate.  All instruments removed from the vagina.  LABORATORY AND RADIOLOGIC DATA:  Outside medical records were reviewed to synthesize the above history, along with the history and physical obtained during the visit.   No results found for: "WBC", "HGB", "HCT", "PLT", "GLUCOSE", "CHOL", "TRIG", "HDL", "LDLDIRECT", "[place]", "ALT", "AST", "NA", "K", "CL", "CREATININE", "BUN", "CO2", "TSH", "PSA", "INR", "GLUF", "HGBA1C", "MICROALBUR"

## 2021-12-30 ENCOUNTER — Encounter: Payer: Self-pay | Admitting: Gynecologic Oncology

## 2021-12-30 ENCOUNTER — Other Ambulatory Visit: Payer: Self-pay

## 2021-12-30 ENCOUNTER — Inpatient Hospital Stay (HOSPITAL_BASED_OUTPATIENT_CLINIC_OR_DEPARTMENT_OTHER): Payer: No Typology Code available for payment source | Admitting: Gynecologic Oncology

## 2021-12-30 ENCOUNTER — Inpatient Hospital Stay: Payer: No Typology Code available for payment source | Attending: Gynecologic Oncology | Admitting: Gynecologic Oncology

## 2021-12-30 VITALS — BP 156/102 | HR 92 | Temp 98.2°F | Resp 18 | Ht 63.0 in | Wt 124.0 lb

## 2021-12-30 DIAGNOSIS — R109 Unspecified abdominal pain: Secondary | ICD-10-CM | POA: Diagnosis not present

## 2021-12-30 DIAGNOSIS — E039 Hypothyroidism, unspecified: Secondary | ICD-10-CM | POA: Insufficient documentation

## 2021-12-30 DIAGNOSIS — E559 Vitamin D deficiency, unspecified: Secondary | ICD-10-CM | POA: Insufficient documentation

## 2021-12-30 DIAGNOSIS — R102 Pelvic and perineal pain: Secondary | ICD-10-CM | POA: Diagnosis not present

## 2021-12-30 DIAGNOSIS — N83202 Unspecified ovarian cyst, left side: Secondary | ICD-10-CM | POA: Diagnosis present

## 2021-12-30 DIAGNOSIS — R030 Elevated blood-pressure reading, without diagnosis of hypertension: Secondary | ICD-10-CM | POA: Insufficient documentation

## 2021-12-30 DIAGNOSIS — F419 Anxiety disorder, unspecified: Secondary | ICD-10-CM | POA: Diagnosis not present

## 2021-12-30 DIAGNOSIS — N83201 Unspecified ovarian cyst, right side: Secondary | ICD-10-CM | POA: Diagnosis present

## 2021-12-30 DIAGNOSIS — C579 Malignant neoplasm of female genital organ, unspecified: Secondary | ICD-10-CM | POA: Diagnosis not present

## 2021-12-30 DIAGNOSIS — R971 Elevated cancer antigen 125 [CA 125]: Secondary | ICD-10-CM | POA: Diagnosis not present

## 2021-12-30 DIAGNOSIS — N951 Menopausal and female climacteric states: Secondary | ICD-10-CM | POA: Insufficient documentation

## 2021-12-30 DIAGNOSIS — J45909 Unspecified asthma, uncomplicated: Secondary | ICD-10-CM | POA: Insufficient documentation

## 2021-12-30 DIAGNOSIS — D259 Leiomyoma of uterus, unspecified: Secondary | ICD-10-CM | POA: Insufficient documentation

## 2021-12-30 DIAGNOSIS — R7303 Prediabetes: Secondary | ICD-10-CM | POA: Insufficient documentation

## 2021-12-30 DIAGNOSIS — E782 Mixed hyperlipidemia: Secondary | ICD-10-CM | POA: Insufficient documentation

## 2021-12-30 DIAGNOSIS — Z79899 Other long term (current) drug therapy: Secondary | ICD-10-CM | POA: Insufficient documentation

## 2021-12-30 DIAGNOSIS — M19049 Primary osteoarthritis, unspecified hand: Secondary | ICD-10-CM | POA: Insufficient documentation

## 2021-12-30 DIAGNOSIS — Z860100 Personal history of colon polyps, unspecified: Secondary | ICD-10-CM | POA: Insufficient documentation

## 2021-12-30 DIAGNOSIS — E042 Nontoxic multinodular goiter: Secondary | ICD-10-CM | POA: Insufficient documentation

## 2021-12-30 DIAGNOSIS — R978 Other abnormal tumor markers: Secondary | ICD-10-CM

## 2021-12-30 DIAGNOSIS — N952 Postmenopausal atrophic vaginitis: Secondary | ICD-10-CM | POA: Insufficient documentation

## 2021-12-30 DIAGNOSIS — N950 Postmenopausal bleeding: Secondary | ICD-10-CM | POA: Insufficient documentation

## 2021-12-30 MED ORDER — IBUPROFEN 800 MG PO TABS: 800.0000 mg | ORAL_TABLET | Freq: Three times a day (TID) | ORAL | 0 refills | Status: DC | PRN

## 2021-12-30 MED ORDER — SENNOSIDES-DOCUSATE SODIUM 8.6-50 MG PO TABS: 2.0000 | ORAL_TABLET | Freq: Every day | ORAL | 0 refills | Status: DC

## 2021-12-30 MED ORDER — TRAMADOL HCL 50 MG PO TABS: 50.0000 mg | ORAL_TABLET | Freq: Four times a day (QID) | ORAL | 0 refills | Status: DC | PRN

## 2021-12-30 NOTE — Patient Instructions (Signed)
Plan to have a MRI of the pelvis and Dr. Berline Lopes will contact you with the results.   Dr. 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 She will let you know the results of this as well.   We will plan for surgery on 01/18/22 but if we get a sooner date for next week, we will contact you to move surgery up. We will need to have the MRI before surgery.  Preparing for your Surgery  Plan for surgery on January 18, 2022 with Dr. Jeral Pinch at [facility] will be scheduled for endometrial biopsy (sample taken from the lining of the uterus), robotic assisted total laparoscopic hysterectomy (removal of the uterus and cervix), bilateral salpingo-oophorectomy (removal of both ovaries and fallopian tubes), possible sentinel lymph node biopsy, possible staging, possible laparotomy (larger incision on your abdomen if needed), mini laparotomy for specimen delivery.   Pre-operative Testing -You will receive a phone call from presurgical testing at [facility] to arrange for a pre-operative appointment and lab work.  -Bring your insurance card, copy of an advanced directive if applicable, medication list  -At that visit, you will be asked to sign a consent for a possible blood transfusion in case a transfusion becomes necessary during surgery.  The need for a blood transfusion is rare but having consent is a necessary part of your care.     -You should not be taking blood thinners or aspirin at least ten days prior to surgery unless instructed by your surgeon.  -Do not take supplements such as fish oil (omega 3), red yeast rice, turmeric before your surgery. You want to avoid medications with aspirin in them including headache powders such as BC or Goody's), Excedrin migraine.  Day Before Surgery at [place] will be asked to take in a light diet the day before surgery. You will be advised you can have clear liquids up until 3  hours before your surgery.    Eat a light diet the day before surgery.  Examples including soups, broths, toast, yogurt, mashed potatoes.  AVOID GAS PRODUCING FOODS. Things to avoid include carbonated beverages (fizzy beverages, sodas), raw fruits and raw vegetables (uncooked), or beans.   If your bowels are filled with gas, your surgeon will have difficulty visualizing your pelvic organs which increases your surgical risks.  Your role in recovery Your role is to become active as soon as directed by your doctor, while still giving yourself time to heal.  Rest when you feel tired. You will be asked to do the following in order to speed your recovery:  - Cough and breathe deeply. This helps to clear and expand your lungs and can prevent pneumonia after surgery.  - [place]. Do mild physical activity. Walking or moving your legs help your circulation and body functions return to normal. Do not try to get up or walk alone the first time after surgery.   -If you develop swelling on one leg or the other, pain in the back of your leg, redness/warmth in one of your legs, please call the office or go to the Emergency Room to have a doppler to rule out a blood clot. For shortness of breath, chest pain-seek care in the Emergency Room as soon as possible. - Actively manage your pain. Managing your pain lets you move in comfort. We will ask you to rate your pain on a scale of zero  to 10. It is your responsibility to tell your doctor or nurse where and how much you hurt so your pain can be treated.  Special Considerations -If you are diabetic, you may be placed on insulin after surgery to have closer control over your blood sugars to promote healing and recovery.  This does not mean that you will be discharged on insulin.  If applicable, your oral antidiabetics will be resumed when you are tolerating a solid diet.  -Your final pathology results from surgery should be available around one week  after surgery and the results will be relayed to you when available.  -FMLA forms can be faxed to [phone] and please allow 5-7 business days for completion.  Pain Management After Surgery -You have been prescribed your pain medication and bowel regimen medications before surgery so that you can have these available when you are discharged from the hospital. The pain medication is for use ONLY AFTER surgery and a new prescription will not be given.   -Make sure that you have Tylenol and Ibuprofen IF YOU ARE ABLE TO TAKE THESE MEDICATIONS at home to use on a regular basis after surgery for pain control. We recommend alternating the medications every hour to six hours since they work differently and are processed in the body differently for pain relief.  -Review the attached handout on narcotic use and their risks and side effects.   Bowel Regimen -You have been prescribed Sennakot-S to take nightly to prevent constipation especially if you are taking the narcotic pain medication intermittently.  It is important to prevent constipation and drink adequate amounts of liquids. You can stop taking this medication when you are not taking pain medication and you are back on your normal bowel routine.  Risks of Surgery Risks of surgery are low but include bleeding, infection, damage to surrounding structures, re-operation, blood clots, and very rarely death.   Blood Transfusion Information (For the consent to be signed before surgery)  We will be checking your blood type before surgery so in case of emergencies, we will know what type of blood you would need.                                            WHAT IS A BLOOD TRANSFUSION?  A transfusion is the replacement of blood or some of its parts. Blood is made up of multiple cells which provide different functions. Red blood cells carry oxygen and are used for blood loss replacement. White blood cells fight against infection. Platelets control  bleeding. Plasma helps clot blood. Other blood products are available for specialized needs, such as hemophilia or other clotting disorders. BEFORE THE TRANSFUSION  Who gives blood for transfusions?  You may be able to donate blood to be used at a later date on yourself (autologous donation). Relatives can be asked to donate blood. This is generally not any safer than if you have received blood from a stranger. The same precautions are taken to ensure safety when a relative's blood is donated. Healthy volunteers who are fully evaluated to make sure their blood is safe. This is blood bank blood. Transfusion therapy is the safest it has ever been in the practice of medicine. Before blood is taken from a donor, a complete history is taken to make sure that person has no history of diseases nor engages in risky social behavior (examples  are intravenous drug use or sexual activity with multiple partners). The donor's travel history is screened to minimize risk of transmitting infections, such as malaria. The donated blood is tested for signs of infectious diseases, such as HIV and hepatitis. The blood is then tested to be sure it is compatible with you in order to minimize the chance of a transfusion reaction. If you or a relative donates blood, this is often done in anticipation of surgery and is not appropriate for emergency situations. It takes many days to process the donated blood. RISKS AND COMPLICATIONS Although transfusion therapy is very safe and saves many lives, the main dangers of transfusion include:  Getting an infectious disease. Developing a transfusion reaction. This is an allergic reaction to something in the blood you were given. Every precaution is taken to prevent this. The decision to have a blood transfusion has been considered carefully by your caregiver before blood is given. Blood is not given unless the benefits outweigh the risks.  AFTER SURGERY INSTRUCTIONS  Return to work:  4-6 weeks if applicable  Activity: 1. Be up and out of the bed during the day.  Take a nap if needed.  You may walk up steps but be careful and use the hand rail.  Stair climbing will tire you more than you think, you may need to stop part way and rest.   2. No lifting or straining for 6 weeks over 10 pounds. No pushing, pulling, straining for 6 weeks.  3. No driving for around 1 week(s).  Do not drive if you are taking narcotic pain medicine and make sure that your reaction time has returned.   4. You can shower as soon as the next day after surgery. Shower daily.  Use your regular soap and water (not directly on the incision) and pat your incision(s) dry afterwards; don't rub.  No tub baths or submerging your body in water until cleared by your surgeon. If you have the soap that was given to you by pre-surgical testing that was used before surgery, you do not need to use it afterwards because this can irritate your incisions.   5. No sexual activity and nothing in the vagina for 8 weeks.  6. You may experience a small amount of clear drainage from your incisions, which is normal.  If the drainage persists, increases, or changes color please call the office.  7. Do not use creams, lotions, or ointments such as neosporin on your incisions after surgery until advised by your surgeon because they can cause removal of the dermabond glue on your incisions.    8. You may experience vaginal spotting after surgery or around the 6-8 week mark from surgery when the stitches at the top of the vagina begin to dissolve.  The spotting is normal but if you experience heavy bleeding, call our office.  9. Take Tylenol or ibuprofen first for pain if you are able to take these medications and only use narcotic pain medication for severe pain not relieved by the Tylenol or Ibuprofen.  Monitor your Tylenol intake to a max of 4,000 mg in a 24 hour period. You can alternate these medications after surgery.  Diet: 1.  Low sodium Heart Healthy Diet is recommended but you are cleared to resume your normal (before surgery) diet after your procedure.  2. It is safe to use a laxative, such as Miralax or Colace, if you have difficulty moving your bowels. You have been prescribed Sennakot-S to take at bedtime  every evening after surgery to keep bowel movements regular and to prevent constipation.    Wound Care: 1. Keep clean and dry.  Shower daily.  Reasons to call the Doctor: Fever - Oral temperature greater than 100.4 degrees Fahrenheit Foul-smelling vaginal discharge Difficulty urinating Nausea and vomiting Increased pain at the site of the incision that is unrelieved with pain medicine. Difficulty breathing with or without chest pain New calf pain especially if only on one side Sudden, continuing increased vaginal bleeding with or without clots.   Contacts: For questions or concerns you should contact:  Dr. Jeral Pinch at [phone]  Joylene John, NP at [phone]  After Hours: call [phone] and have the GYN Oncologist paged/contacted (after 5 pm or on the weekends).  Messages sent via mychart are for non-urgent matters and are not responded to after hours so for urgent needs, please call the after hours number.

## 2021-12-30 NOTE — Progress Notes (Signed)
Patient here for new patient consultation with Dr. Jeral Pinch and for a pre-operative discussion prior to her scheduled surgery on January 18, 2022. She is scheduled for endometrial biopsy (sample taken from the lining of the uterus), robotic assisted total laparoscopic hysterectomy (removal of the uterus and cervix), bilateral salpingo-oophorectomy (removal of both ovaries and fallopian tubes), possible sentinel lymph node biopsy, possible staging, possible laparotomy (larger incision on your abdomen if needed), mini laparotomy for specimen delivery. The surgery was discussed in detail.  See after visit summary for additional details. Visual aids used to discuss items related to surgery including sequential compression stockings, foley catheter, IV pump, multi-modal pain regimen including tylenol, photo of the surgical robot, female reproductive system to discuss surgery in detail.      Discussed post-op pain management in detail including the aspects of the enhanced recovery pathway.  Advised her that a new prescription would be sent in for tramadol and it is only to be used for after her upcoming surgery.  We discussed the use of tylenol post-op and to monitor for a maximum of 4,000 mg in a 24 hour period.  Also prescribed sennakot to be used after surgery and to hold if having loose stools.  Discussed bowel regimen in detail.     Discussed the use of SCDs and measures to take at home to prevent DVT including frequent mobility.  Reportable signs and symptoms of DVT discussed. Post-operative instructions discussed and expectations for after surgery. Incisional care discussed as well including reportable signs and symptoms including erythema, drainage, wound separation.     5 minutes spent with the patient.  Verbalizing understanding of material discussed. No needs or concerns voiced at the end of the visit.   Advised patient to call for any needs.  Advised that her post-operative medications had been  prescribed and could be picked up at any time. Upcoming appts discussed including MRI.    This appointment is included in the global surgical bundle as pre-operative teaching and has no charge.

## 2021-12-30 NOTE — Patient Instructions (Signed)
Plan to have a MRI of the pelvis and Dr. Berline Lopes will contact you with the results.    Dr. 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 She will let you know the results of this as well.    We will plan for surgery on 01/18/22 but if we get a sooner date for next week, we will contact you to move surgery up. We will need to have the MRI before surgery.   Preparing for your Surgery   Plan for surgery on January 18, 2022 with Dr. Jeral Pinch at [place] will be scheduled for endometrial biopsy (sample taken from the lining of the uterus), robotic assisted total laparoscopic hysterectomy (removal of the uterus and cervix), bilateral salpingo-oophorectomy (removal of both ovaries and fallopian tubes), possible sentinel lymph node biopsy, possible staging, possible laparotomy (larger incision on your abdomen if needed), mini laparotomy for specimen delivery.    Pre-operative Testing -You will receive a phone call from presurgical testing at [facility] to arrange for a pre-operative appointment and lab work.   -Bring your insurance card, copy of an advanced directive if applicable, medication list   -At that visit, you will be asked to sign a consent for a possible blood transfusion in case a transfusion becomes necessary during surgery.  The need for a blood transfusion is rare but having consent is a necessary part of your care.      -You should not be taking blood thinners or aspirin at least ten days prior to surgery unless instructed by your surgeon.   -Do not take supplements such as fish oil (omega 3), red yeast rice, turmeric before your surgery. You want to avoid medications with aspirin in them including headache powders such as BC or Goody's), Excedrin migraine.   Day Before Surgery at [place] will be asked to take in a light diet the day before surgery. You will be advised you can have clear liquids  up until 3 hours before your surgery.     Eat a light diet the day before surgery.  Examples including soups, broths, toast, yogurt, mashed potatoes.  AVOID GAS PRODUCING FOODS. Things to avoid include carbonated beverages (fizzy beverages, sodas), raw fruits and raw vegetables (uncooked), or beans.    If your bowels are filled with gas, your surgeon will have difficulty visualizing your pelvic organs which increases your surgical risks.   Your role in recovery Your role is to become active as soon as directed by your doctor, while still giving yourself time to heal.  Rest when you feel tired. You will be asked to do the following in order to speed your recovery:   - Cough and breathe deeply. This helps to clear and expand your lungs and can prevent pneumonia after surgery.  - [place]. Do mild physical activity. Walking or moving your legs help your circulation and body functions return to normal. Do not try to get up or walk alone the first time after surgery.   -If you develop swelling on one leg or the other, pain in the back of your leg, redness/warmth in one of your legs, please call the office or go to the Emergency Room to have a doppler to rule out a blood clot. For shortness of breath, chest pain-seek care in the Emergency Room as soon as possible. - Actively manage your pain. Managing your pain lets you move in  comfort. We will ask you to rate your pain on a scale of zero to 10. It is your responsibility to tell your doctor or nurse where and how much you hurt so your pain can be treated.   Special Considerations -If you are diabetic, you may be placed on insulin after surgery to have closer control over your blood sugars to promote healing and recovery.  This does not mean that you will be discharged on insulin.  If applicable, your oral antidiabetics will be resumed when you are tolerating a solid diet.   -Your final pathology results from surgery should be  available around one week after surgery and the results will be relayed to you when available.   -FMLA forms can be faxed to [phone] and please allow 5-7 business days for completion.   Pain Management After Surgery -You have been prescribed your pain medication and bowel regimen medications before surgery so that you can have these available when you are discharged from the hospital. The pain medication is for use ONLY AFTER surgery and a new prescription will not be given.    -Make sure that you have Tylenol and Ibuprofen IF YOU ARE ABLE TO TAKE THESE MEDICATIONS at home to use on a regular basis after surgery for pain control. We recommend alternating the medications every hour to six hours since they work differently and are processed in the body differently for pain relief.   -Review the attached handout on narcotic use and their risks and side effects.    Bowel Regimen -You have been prescribed Sennakot-S to take nightly to prevent constipation especially if you are taking the narcotic pain medication intermittently.  It is important to prevent constipation and drink adequate amounts of liquids. You can stop taking this medication when you are not taking pain medication and you are back on your normal bowel routine.   Risks of Surgery Risks of surgery are low but include bleeding, infection, damage to surrounding structures, re-operation, blood clots, and very rarely death.     Blood Transfusion Information (For the consent to be signed before surgery)   We will be checking your blood type before surgery so in case of emergencies, we will know what type of blood you would need.                                             WHAT IS A BLOOD TRANSFUSION?   A transfusion is the replacement of blood or some of its parts. Blood is made up of multiple cells which provide different functions. Red blood cells carry oxygen and are used for blood loss replacement. White blood cells fight  against infection. Platelets control bleeding. Plasma helps clot blood. Other blood products are available for specialized needs, such as hemophilia or other clotting disorders. BEFORE THE TRANSFUSION  Who gives blood for transfusions?  You may be able to donate blood to be used at a later date on yourself (autologous donation). Relatives can be asked to donate blood. This is generally not any safer than if you have received blood from a stranger. The same precautions are taken to ensure safety when a relative's blood is donated. Healthy volunteers who are fully evaluated to make sure their blood is safe. This is blood bank blood. Transfusion therapy is the safest it has ever been in the practice of medicine. Before blood  is taken from a donor, a complete history is taken to make sure that person has no history of diseases nor engages in risky social behavior (examples are intravenous drug use or sexual activity with multiple partners). The donor's travel history is screened to minimize risk of transmitting infections, such as malaria. The donated blood is tested for signs of infectious diseases, such as HIV and hepatitis. The blood is then tested to be sure it is compatible with you in order to minimize the chance of a transfusion reaction. If you or a relative donates blood, this is often done in anticipation of surgery and is not appropriate for emergency situations. It takes many days to process the donated blood. RISKS AND COMPLICATIONS Although transfusion therapy is very safe and saves many lives, the main dangers of transfusion include:  Getting an infectious disease. Developing a transfusion reaction. This is an allergic reaction to something in the blood you were given. Every precaution is taken to prevent this. The decision to have a blood transfusion has been considered carefully by your caregiver before blood is given. Blood is not given unless the benefits outweigh the risks.   AFTER  SURGERY INSTRUCTIONS   Return to work: 4-6 weeks if applicable   Activity: 1. Be up and out of the bed during the day.  Take a nap if needed.  You may walk up steps but be careful and use the hand rail.  Stair climbing will tire you more than you think, you may need to stop part way and rest.    2. No lifting or straining for 6 weeks over 10 pounds. No pushing, pulling, straining for 6 weeks.   3. No driving for around 1 week(s).  Do not drive if you are taking narcotic pain medicine and make sure that your reaction time has returned.    4. You can shower as soon as the next day after surgery. Shower daily.  Use your regular soap and water (not directly on the incision) and pat your incision(s) dry afterwards; don't rub.  No tub baths or submerging your body in water until cleared by your surgeon. If you have the soap that was given to you by pre-surgical testing that was used before surgery, you do not need to use it afterwards because this can irritate your incisions.    5. No sexual activity and nothing in the vagina for 8 weeks.   6. You may experience a small amount of clear drainage from your incisions, which is normal.  If the drainage persists, increases, or changes color please call the office.   7. Do not use creams, lotions, or ointments such as neosporin on your incisions after surgery until advised by your surgeon because they can cause removal of the dermabond glue on your incisions.     8. You may experience vaginal spotting after surgery or around the 6-8 week mark from surgery when the stitches at the top of the vagina begin to dissolve.  The spotting is normal but if you experience heavy bleeding, call our office.   9. Take Tylenol or ibuprofen first for pain if you are able to take these medications and only use narcotic pain medication for severe pain not relieved by the Tylenol or Ibuprofen.  Monitor your Tylenol intake to a max of 4,000 mg in a 24 hour period. You can  alternate these medications after surgery.   Diet: 1. Low sodium Heart Healthy Diet is recommended but you are cleared to  resume your normal (before surgery) diet after your procedure.   2. It is safe to use a laxative, such as Miralax or Colace, if you have difficulty moving your bowels. You have been prescribed Sennakot-S to take at bedtime every evening after surgery to keep bowel movements regular and to prevent constipation.     Wound Care: 1. Keep clean and dry.  Shower daily.   Reasons to call the Doctor: Fever - Oral temperature greater than 100.4 degrees Fahrenheit Foul-smelling vaginal discharge Difficulty urinating Nausea and vomiting Increased pain at the site of the incision that is unrelieved with pain medicine. Difficulty breathing with or without chest pain New calf pain especially if only on one side Sudden, continuing increased vaginal bleeding with or without clots.   Contacts: For questions or concerns you should contact:   Dr. Jeral Pinch at [phone]   Joylene John, NP at [phone]   After Hours: call [phone] and have the GYN Oncologist paged/contacted (after 5 pm or on the weekends).   Messages sent via mychart are for non-urgent matters and are not responded to after hours so for urgent needs, please call the after hours number.

## 2022-01-03 ENCOUNTER — Ambulatory Visit (HOSPITAL_COMMUNITY)
Admission: RE | Admit: 2022-01-03 | Discharge: 2022-01-03 | Disposition: A | Discharge status: Home / Self Care | Payer: No Typology Code available for payment source | Source: Ambulatory Visit | Attending: Gynecologic Oncology | Admitting: Gynecologic Oncology

## 2022-01-03 DIAGNOSIS — D259 Leiomyoma of uterus, unspecified: Secondary | ICD-10-CM | POA: Diagnosis present

## 2022-01-03 MED ORDER — GADOBUTROL 1 MMOL/ML IV SOLN
5.0000 mL | Freq: Once | INTRAVENOUS | Status: AC | PRN
  Administered 2022-01-03: 5 mL via INTRAVENOUS

## 2022-01-03 NOTE — Progress Notes (Addendum)
Anesthesia Review:  PCP: DR Melinda Crutch  Cardiologist : none  Chest x-ray : EKG : Echo : Stress test: Cardiac Cath :  Activity level:  can do a flight of stairs without difficulty  Sleep Study/ CPAP : none  Fasting Blood Sugar :      / Checks Blood Sugar -- times a day:   Blood Thinner/ Instructions /Last Dose: ASA / Instructions/ Last Dose :   PT given booklet for Advanced Directive at time of preop appt.  PT stated she was given a number to call but has not been able to get thru on phone number.

## 2022-01-03 NOTE — Progress Notes (Signed)
DUE TO COVID-19 ONLY ONE VISITOR IS ALLOWED TO COME WITH YOU AND STAY IN THE WAITING ROOM ONLY DURING PRE OP AND PROCEDURE DAY OF SURGERY.  2 VISITOR  MAY VISIT WITH YOU AFTER SURGERY IN YOUR PRIVATE ROOM DURING VISITING HOURS ONLY! YOU MAY HAVE ONE PERSON SPEND THE NITE WITH YOU IN YOUR ROOM AFTER SURGERY.     Your procedure is scheduled on:        01/18/2022   Report to [facility] Main  Entrance   Report to admitting at      0630           AM DO NOT BRING INSURANCE CARD, PICTURE ID OR WALLET DAY OF SURGERY.      Call this number if you have problems the morning of surgery [phone]    Eat a light diet the  day before surgery.  Avoid gas producing foods.    REMEMBER: NO  SOLID FOODS , CANDY, GUM OR MINTS AFTER MIDNITE THE NITE BEFORE SURGERY .       [place] Kitchen CLEAR LIQUIDS UNTIL    0530am             DAY OF SURGERY.       CLEAR LIQUID DIET   Foods Allowed      WATER BLACK COFFEE ( SUGAR OK, NO MILK, CREAM OR CREAMER) REGULAR AND DECAF  TEA ( SUGAR OK NO MILK, CREAM, OR CREAMER) REGULAR AND DECAF  PLAIN JELLO ( NO RED)  FRUIT ICES ( NO RED, NO FRUIT PULP)  POPSICLES ( NO RED)  JUICE- APPLE, WHITE GRAPE AND WHITE CRANBERRY  SPORT DRINK LIKE GATORADE ( NO RED)  CLEAR BROTH ( VEGETABLE , CHICKEN OR BEEF)                                                                     BRUSH YOUR TEETH MORNING OF SURGERY AND RINSE YOUR MOUTH OUT, NO CHEWING GUM CANDY OR MINTS.     Take these medicines the morning of surgery with A SIP OF WATER:  none    DO NOT TAKE ANY DIABETIC MEDICATIONS DAY OF YOUR SURGERY                               You may not have any metal on your body including hair pins and              piercings  Do not wear jewelry, make-up, lotions, powders or perfumes, deodorant             Do not wear nail polish on your fingernails.              IF YOU ARE A FEMALE AND WANT TO SHAVE UNDER ARMS OR LEGS PRIOR TO SURGERY YOU MUST DO SO AT LEAST 48 HOURS PRIOR TO SURGERY.               Men may shave face and neck.   Do not bring valuables to the hospital. [place].  Contacts, dentures or bridgework may not be worn into surgery.  Leave suitcase in the car. After surgery it may be brought to your room.     Patients discharged the day of surgery will not be allowed to drive home. IF YOU ARE HAVING SURGERY AND GOING HOME THE SAME DAY, YOU MUST HAVE AN ADULT TO DRIVE YOU HOME AND BE WITH YOU FOR 24 HOURS. YOU MAY GO HOME BY TAXI OR UBER OR ORTHERWISE, BUT AN ADULT MUST ACCOMPANY YOU HOME AND STAY WITH YOU FOR 24 HOURS.                Please read over the following fact sheets you were given: _____________________________________________________________________  [facility] - Preparing for Surgery Before surgery, you can play an important role.  Because skin is not sterile, your skin needs to be as free of germs as possible.  You can reduce the number of germs on your skin by washing with CHG (chlorahexidine gluconate) soap before surgery.  CHG is an antiseptic cleaner which kills germs and bonds with the skin to continue killing germs even after washing. Please DO NOT use if you have an allergy to CHG or antibacterial soaps.  If your skin becomes reddened/irritated stop using the CHG and inform your nurse when you arrive at Short Stay. Do not shave (including legs and underarms) for at least 48 hours prior to the first CHG shower.  You may shave your face/neck. Please follow these instructions carefully:  1.  Shower with CHG Soap the night before surgery and the  morning of Surgery.  2.  If you choose to wash your hair, wash your hair first as usual with your  normal  shampoo.  3.  After you shampoo, rinse your hair and body thoroughly to remove the  shampoo.                           4.  Use CHG as you would any other liquid soap.  You can apply chg directly  to the skin and wash                       Gently with a  scrungie or clean washcloth.  5.  Apply the CHG Soap to your body ONLY FROM THE NECK DOWN.   Do not use on face/ open                           Wound or open sores. Avoid contact with eyes, ears mouth and genitals (private parts).                       Wash face,  Genitals (private parts) with your normal soap.             6.  Wash thoroughly, paying special attention to the area where your surgery  will be performed.  7.  Thoroughly rinse your body with warm water from the neck down.  8.  DO NOT shower/wash with your normal soap after using and rinsing off  the CHG Soap.                9.  Pat yourself dry with a clean towel.            10.  Wear clean pajamas.            11.  Place clean sheets on your bed the night of your first  shower and do not  sleep with pets. Day of Surgery : Do not apply any lotions/deodorants the morning of surgery.  Please wear clean clothes to the hospital/surgery center.  FAILURE TO FOLLOW THESE INSTRUCTIONS MAY RESULT IN THE CANCELLATION OF YOUR SURGERY PATIENT SIGNATURE_________________________________  NURSE SIGNATURE__________________________________  ________________________________________________________________________

## 2022-01-04 ENCOUNTER — Telehealth: Payer: Self-pay

## 2022-01-04 LAB — SURGICAL PATHOLOGY

## 2022-01-04 NOTE — Telephone Encounter (Signed)
Received call from patient inquiring if her surgery can be moved to an earlier date as she had her MRI yesterday and will have her pre-admission appointment tomorrow. NP notified. Per NP, Dr. Berline Lopes will call patient to discuss plan. Patient verbalized understanding.

## 2022-01-05 ENCOUNTER — Encounter (HOSPITAL_COMMUNITY)
Admission: RE | Admit: 2022-01-05 | Discharge: 2022-01-05 | Disposition: A | Discharge status: Home / Self Care | Payer: No Typology Code available for payment source | Source: Ambulatory Visit | Attending: Gynecologic Oncology | Admitting: Gynecologic Oncology

## 2022-01-05 ENCOUNTER — Encounter (HOSPITAL_COMMUNITY): Payer: Self-pay

## 2022-01-05 ENCOUNTER — Telehealth: Payer: Self-pay | Admitting: Gynecologic Oncology

## 2022-01-05 ENCOUNTER — Other Ambulatory Visit: Payer: Self-pay

## 2022-01-05 ENCOUNTER — Telehealth: Payer: No Typology Code available for payment source | Admitting: Gynecologic Oncology

## 2022-01-05 VITALS — BP 156/96 | HR 86 | Temp 98.4°F | Resp 16 | Ht 63.0 in | Wt 127.0 lb

## 2022-01-05 DIAGNOSIS — Z01818 Encounter for other preprocedural examination: Secondary | ICD-10-CM | POA: Diagnosis present

## 2022-01-05 DIAGNOSIS — D4959 Neoplasm of unspecified behavior of other genitourinary organ: Secondary | ICD-10-CM | POA: Insufficient documentation

## 2022-01-05 DIAGNOSIS — R978 Other abnormal tumor markers: Secondary | ICD-10-CM | POA: Insufficient documentation

## 2022-01-05 DIAGNOSIS — C579 Malignant neoplasm of female genital organ, unspecified: Secondary | ICD-10-CM

## 2022-01-05 HISTORY — DX: Gastro-esophageal reflux disease without esophagitis: K21.9

## 2022-01-05 HISTORY — DX: Depression, unspecified: F32.A

## 2022-01-05 HISTORY — DX: Anxiety disorder, unspecified: F41.9

## 2022-01-05 LAB — COMPREHENSIVE METABOLIC PANEL
ALT: 19 U/L (ref 0–44)
AST: 24 U/L (ref 15–41)
Albumin: 3.9 g/dL (ref 3.5–5.0)
Alkaline Phosphatase: 47 U/L (ref 38–126)
Anion gap: 7 (ref 5–15)
BUN: 19 mg/dL (ref 6–20)
CO2: 23 mmol/L (ref 22–32)
Calcium: 9.1 mg/dL (ref 8.9–10.3)
Chloride: 111 mmol/L (ref 98–111)
Creatinine, Ser: 0.66 mg/dL (ref 0.44–1.00)
GFR, Estimated: >60 mL/min (ref >60)
Glucose, Bld: 119 mg/dL — ABNORMAL HIGH (ref 70–99)
Potassium: 3.8 mmol/L (ref 3.5–5.1)
Sodium: 141 mmol/L (ref 135–145)
Total Bilirubin: 0.3 mg/dL (ref 0.3–1.2)
Total Protein: 6.9 g/dL (ref 6.5–8.1)

## 2022-01-05 LAB — CBC
HCT: 41.5 % (ref 36.0–46.0)
Hemoglobin: 13.6 g/dL (ref 12.0–15.0)
MCH: 25 pg — ABNORMAL LOW (ref 26.0–34.0)
MCHC: 32.8 g/dL (ref 30.0–36.0)
MCV: 76.4 fL — ABNORMAL LOW (ref 80.0–100.0)
Platelets: 304 10*3/uL (ref 150–400)
RBC: 5.43 MIL/uL — ABNORMAL HIGH (ref 3.87–5.11)
RDW: 13.7 % (ref 11.5–15.5)
WBC: 8.2 10*3/uL (ref 4.0–10.5)
nRBC: 0 % (ref 0.0–0.2)

## 2022-01-05 LAB — TYPE AND SCREEN
ABO/RH(D): A POS
Antibody Screen: NEGATIVE

## 2022-01-05 NOTE — Telephone Encounter (Signed)
Called the patient, reviewed biopsy results. 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 I'm concerned that bilateral adnexal masses represent metastatic disease. I recommend PET scan (ordered) and I'd like to keep surgery date - will plan for EUA, possible additional biopsies of intra-vaginal mass, possible diagnostic laparoscopy with biopsy vs. USO or BSO, any other indicated procedure.   Patient asked that I call her radiologist friend, Dr. Pricilla Riffle, at [phone].  Jeral Pinch MD Gynecologic Oncology

## 2022-01-05 NOTE — Telephone Encounter (Signed)
Called the patient, discussed MRI findings with her. Biopsy results are still pending. Depending on whether biopsy supports benign diagnosis or malignancy, will discuss need for PET scan. Will keep plan for surgery although procedures to be performed may be different than what we discussed (may performed EUA, dx lsc with biopsies vs USO/BSO).  I will call her once biopsy results back.  Jeral Pinch MD Gynecologic Oncology

## 2022-01-05 NOTE — Telephone Encounter (Signed)
Called Dr. Pricilla Riffle, per patient's request. He has reviewed her CT scan. We discussed MRI findings as well as biopsy results. Also discussed tentative plans for upcoming surgery and likely NACT.   Jeral Pinch MD Gynecologic Oncology

## 2022-01-05 NOTE — Telephone Encounter (Signed)
Pt called after hours triage line stating she saw her biopsy results on MyChart and she has questions as what Dr.Tuckers plan is.Does Tiffany [name] still want to do surgery or go straight to Chemo.  She states she wasn't sure if she needed to keep the Pre-admit testing and labs today scheduled for 8:00am.   In looking at pt's chart I noticed Dr.Tucker had sent a Mychart message regarding the biopsy results. I spoke to pt and she states she was here at the appointment and had labs drawn. I notified her of Dr.Tuckers message in [place] and she stated she will look at it. She also stated she wants to talk to Dr.Tucker because she has more questions.   Appointment made for this afternoon, at 4:00pm for phone visit.  I will make Dr.Tucker aware

## 2022-01-05 NOTE — Telephone Encounter (Signed)
PET scan scheduled for 01/12/22 @ 11:00am. Pt aware NPO after 5:00am. Arrive at 10:30. She voiced an understanding and states she will be there.

## 2022-01-06 ENCOUNTER — Other Ambulatory Visit (HOSPITAL_COMMUNITY): Payer: Managed Care, Other (non HMO)

## 2022-01-06 ENCOUNTER — Telehealth: Payer: Self-pay

## 2022-01-06 ENCOUNTER — Inpatient Hospital Stay: Payer: No Typology Code available for payment source | Admitting: Licensed Clinical Social Worker

## 2022-01-06 ENCOUNTER — Other Ambulatory Visit: Payer: Self-pay

## 2022-01-06 DIAGNOSIS — C579 Malignant neoplasm of female genital organ, unspecified: Secondary | ICD-10-CM

## 2022-01-06 NOTE — Telephone Encounter (Signed)
Garnet Koyanagi LCSW, responded to referral stating she will give pt a call and follow up with referral

## 2022-01-06 NOTE — Telephone Encounter (Signed)
Patient called requesting an earlier appointment for her PET scan. Provided patient with phone number for scheduling [phone] to call and check for any cancellations and availability. Patient verbalized understanding.

## 2022-01-06 NOTE — Progress Notes (Signed)
[place] Work  Clinical Social Work was referred by nurse for assessment of psychosocial needs.  Clinical Social Worker contacted patient by phone  to offer support and assess for needs.   Patient has been under significant stress for the past month while diagnosis was being made. She is interested in a support group for people with GYN cancers. She has good friends but wants to connect with others who understand what she is experiencing. CSW shared information on support programs, emailed calendar to patient, and added to GYN support group list.   CSW also shared information on other support services. Pt denied any resource needs at this time.   [place], [place] Worker [employer]

## 2022-01-06 NOTE — Telephone Encounter (Signed)
Pt called office today stating since her biopsy and after talking with Dr.Tucker, she has asked if we could set her up with a nurse navigator or support group. Advised pt I would work on setting this up for her.   GYN/ONC currently does not have a nurse navigator, I sent a referral to social work here at [facility]

## 2022-01-09 ENCOUNTER — Other Ambulatory Visit: Payer: Managed Care, Other (non HMO)

## 2022-01-12 ENCOUNTER — Inpatient Hospital Stay: Payer: No Typology Code available for payment source | Admitting: Licensed Clinical Social Worker

## 2022-01-12 ENCOUNTER — Other Ambulatory Visit: Payer: No Typology Code available for payment source | Admitting: Licensed Clinical Social Worker

## 2022-01-12 ENCOUNTER — Other Ambulatory Visit: Payer: Self-pay

## 2022-01-12 ENCOUNTER — Encounter: Payer: Self-pay | Admitting: Gynecologic Oncology

## 2022-01-12 ENCOUNTER — Ambulatory Visit (HOSPITAL_COMMUNITY)
Admission: RE | Admit: 2022-01-12 | Discharge: 2022-01-12 | Disposition: A | Discharge status: Home / Self Care | Payer: No Typology Code available for payment source | Source: Ambulatory Visit | Attending: Gynecologic Oncology | Admitting: Gynecologic Oncology

## 2022-01-12 ENCOUNTER — Telehealth: Payer: Self-pay | Admitting: Gynecologic Oncology

## 2022-01-12 DIAGNOSIS — C579 Malignant neoplasm of female genital organ, unspecified: Secondary | ICD-10-CM | POA: Insufficient documentation

## 2022-01-12 DIAGNOSIS — N83201 Unspecified ovarian cyst, right side: Secondary | ICD-10-CM | POA: Diagnosis not present

## 2022-01-12 DIAGNOSIS — R971 Elevated cancer antigen 125 [CA 125]: Secondary | ICD-10-CM | POA: Insufficient documentation

## 2022-01-12 DIAGNOSIS — F419 Anxiety disorder, unspecified: Secondary | ICD-10-CM

## 2022-01-12 LAB — GLUCOSE, CAPILLARY: Glucose-Capillary: 125 mg/dL — ABNORMAL HIGH (ref 70–99)

## 2022-01-12 MED ORDER — SERTRALINE HCL 50 MG PO TABS: 50.0000 mg | ORAL_TABLET | Freq: Every day | ORAL | 6 refills | Status: DC

## 2022-01-12 MED ORDER — FLUDEOXYGLUCOSE F - 18 (FDG) INJECTION
6.4000 | Freq: Once | INTRAVENOUS | Status: AC
  Administered 2022-01-12: 6.3 via INTRAVENOUS

## 2022-01-12 NOTE — Progress Notes (Signed)
[facility] Counseling Note  Patient was referred by nurse. Treatment type: Individual  Presenting Concerns: Patient and/or family reports the following symptoms/concerns: anxiety Duration of problem: a few weeks; Severity of problem: severe   Orientation:oriented to person, place, time/date, and situation.   Affect: Appropriate and Congruent Risk of harm to self or others: No plan to harm self or others  Patient and/or Family's Strengths/Protective Factors: Social connectionsAbility for insight  Capable of independent living  Special hobby/interest      Goals Addressed: Patient will:  Reduce symptoms of: anxiety Increase knowledge and/or ability of: coping skills  Increase healthy adjustment to current life circumstances   Progress towards Goals: Initial   Interventions: Interventions utilized:  Supportive and Other: Mindfulness, Psychoeducation       Assessment: Patient currently experiencing anxiety around uncertainty and waiting related to diagnosis. For a few weeks, pt has been getting different diagnoses from different doctors and now has a cancer diagnosis (waiting on scans and surgery to determine exact diagnosis and staging). This uncertainty plus history of seeing her mom and best friend die from cancer after suffering physically has caused heightened anxiety.  Pt goal is to minimize physical suffering and find ways to deal with the uncertainty. Pt enjoys traveling and has done yoga and zumba in the past. She also practices deep breathing. CSW normalized feelings and practiced grounding with senses and a beach visualization to help manage anxiety.    Patient may benefit from ongoing counseling and peer mentorship.   Plan: Follow up with CSW: on 01/16/2022 Behavioral recommendations: practice grounding and visualization skills to help manage anxiety. Focus on what is in your control vs out of it Referral(s): peer mentor       Christeen Douglas, LCSW

## 2022-01-12 NOTE — Telephone Encounter (Signed)
Called the patient to discuss PET scan results.  Large hypermetabolic mass appears to be originating from the cervix or lower uterine segment, suspicious for either uterine or cervical cancer.  No rectal involvement.  Bilateral adnexa with some FDG avid areas, suspicious for metastatic disease.  Discussed with patient my plan for surgery next week.  We will perform exam under anesthesia with additional tissue removal.  I will have pathology look at this while she is asleep for surgery.  If a diagnosis can be made from repeat biopsies and more tissue from the intravaginal mass, then diagnostic laparoscopy will not need to be performed.  Otherwise, we will plan for diagnostic laparoscopy and intra-abdominal biopsies to establish diagnosis.  In the setting of either metastatic uterine or cervical cancer, discussed that we will most likely be discussing systemic therapy.  The treatment used will depend on the type of cancer and where it appears to have originated.  Patient met with our social worker this morning.  She voices significant anxiety about her diagnosis.  She asked about starting medication for anxiety and depression.  I sent in a prescription for Zoloft and sent her some patient information in MyChart about this medication.  Jeral Pinch MD Gynecologic Oncology

## 2022-01-13 ENCOUNTER — Telehealth: Payer: Self-pay | Admitting: *Deleted

## 2022-01-13 NOTE — Telephone Encounter (Signed)
Per Dr Alvy Bimler and Dr Berline Lopes, patient scheduled to see Dr Alvy Bimler and patient aware

## 2022-01-16 ENCOUNTER — Inpatient Hospital Stay: Payer: No Typology Code available for payment source | Admitting: Licensed Clinical Social Worker

## 2022-01-16 ENCOUNTER — Telehealth: Payer: Self-pay

## 2022-01-16 NOTE — Telephone Encounter (Signed)
Received call from Ms. Dales this morning. Patient has picked up her zoloft prescription but is unsure how to take it with her xanax since the zoloft takes 4-5 weeks to start working.   Per NP, it is ok to take the zoloft and xanax together. There is a small chance that taking the two together can be sedating. Be mindful of this. You can take zoloft in the morning and xanax later if needed. Patient verbalized understanding.  Patient inquiring about the surgical plan, if she'll just have biopsies or if all her female organs will be removed. Per NP and Dr. Lulu Riding note the plan will be for an exam under anesthesia with additional tissue removal. Pathology will look at this tissue while the patient is asleep. If diagnosis can be made from the additional tissue then a diagnostic laparoscopy will not need to be performed (no abdominal incisions). Patient verbalized understanding, instructed to call with any needs.

## 2022-01-17 ENCOUNTER — Telehealth: Payer: Self-pay

## 2022-01-17 ENCOUNTER — Telehealth: Payer: Self-pay | Admitting: *Deleted

## 2022-01-17 DIAGNOSIS — C55 Malignant neoplasm of uterus, part unspecified: Secondary | ICD-10-CM

## 2022-01-17 HISTORY — DX: Malignant neoplasm of uterus, part unspecified: C55

## 2022-01-17 NOTE — Telephone Encounter (Signed)
E-mail noted today reading:  "Attachment is disability form that needs to be filled. Please let me know if anything else is needed. Thank you, Katya Thornton No attachment received.     This nurse prepared [place] [employer] [place] forms for e-mail reply.   I am a forms nurse.  Received your e-mail 01/17/2022.  No attachment received with your [place] video.  Please resend.  Send to [email or web address] if this is for Dr. Berline Lopes.  January 24, 2022, is your first visit with a Futures trader.  No treatment plan to process [employer] form currently.   Attachments with this e-mail are required per H.I.P.A.A.  Guidelines to release your protected health information in any manner Electronic, verbally or paper.  [place] provides information needed to complete forms.  Thank you. Blayke Pinera [phone]) called while typing re-mail response says she "Sent e-mail for Dr. Berline Lopes with disability attachment.  Did not receive the automatic response as expected.  Calling because I need to know if my e-mail was received."   Thanked her for calling with provider name and other information.  Confirmed today's receipt of e-mail sent yesterday however no attachment received.  Information above provided verbally and e-mail sent during call.      "I am going through a difficult situation right now!  I know you are trying to help but I cannot retain this information.  I have panic attacks.  Just tell me where I need to send the attachment to."  Confirmed Aishani Keech's receipt of e-mail attachments sent by this nurse.  Asked to return her [employer] form with the [employer] and [employer].    Forwarded e-mail communications to [email or web address]. and voicemail message left for GYN/ONC staff [phone]).  Currently no further questions or needs.

## 2022-01-17 NOTE — Telephone Encounter (Signed)
Telephone call to check on pre-operative status.  Patient compliant with pre-operative instructions.  Reinforced nothing to eat after midnight. Clear liquids until 12pm. Patient to arrive at 12:45pm.  No questions or concerns voiced.  Instructed to call for any needs.

## 2022-01-18 ENCOUNTER — Other Ambulatory Visit: Payer: Self-pay

## 2022-01-18 ENCOUNTER — Encounter (HOSPITAL_COMMUNITY)
Admission: RE | Disposition: A | Discharge status: Home / Self Care | Payer: Self-pay | Source: Ambulatory Visit | Attending: Gynecologic Oncology

## 2022-01-18 ENCOUNTER — Ambulatory Visit (HOSPITAL_COMMUNITY)
Admission: RE | Admit: 2022-01-18 | Discharge: 2022-01-18 | Disposition: A | Discharge status: Home / Self Care | Payer: No Typology Code available for payment source | Source: Ambulatory Visit | Attending: Gynecologic Oncology | Admitting: Gynecologic Oncology

## 2022-01-18 ENCOUNTER — Encounter (HOSPITAL_COMMUNITY): Payer: Self-pay | Admitting: Gynecologic Oncology

## 2022-01-18 ENCOUNTER — Ambulatory Visit (HOSPITAL_COMMUNITY): Payer: No Typology Code available for payment source | Admitting: Physician Assistant

## 2022-01-18 ENCOUNTER — Ambulatory Visit (HOSPITAL_BASED_OUTPATIENT_CLINIC_OR_DEPARTMENT_OTHER): Payer: No Typology Code available for payment source | Admitting: Anesthesiology

## 2022-01-18 DIAGNOSIS — C539 Malignant neoplasm of cervix uteri, unspecified: Secondary | ICD-10-CM

## 2022-01-18 DIAGNOSIS — N888 Other specified noninflammatory disorders of cervix uteri: Secondary | ICD-10-CM

## 2022-01-18 DIAGNOSIS — R1900 Intra-abdominal and pelvic swelling, mass and lump, unspecified site: Secondary | ICD-10-CM

## 2022-01-18 DIAGNOSIS — Z01818 Encounter for other preprocedural examination: Secondary | ICD-10-CM

## 2022-01-18 DIAGNOSIS — R978 Other abnormal tumor markers: Secondary | ICD-10-CM

## 2022-01-18 DIAGNOSIS — F419 Anxiety disorder, unspecified: Secondary | ICD-10-CM | POA: Insufficient documentation

## 2022-01-18 DIAGNOSIS — D4959 Neoplasm of unspecified behavior of other genitourinary organ: Secondary | ICD-10-CM

## 2022-01-18 DIAGNOSIS — E039 Hypothyroidism, unspecified: Secondary | ICD-10-CM | POA: Insufficient documentation

## 2022-01-18 HISTORY — PX: LESION REMOVAL: SHX5196

## 2022-01-18 LAB — ABO/RH: ABO/RH(D): A POS

## 2022-01-18 SURGERY — EXAM UNDER ANESTHESIA
Anesthesia: General

## 2022-01-18 MED ORDER — HEPARIN SODIUM (PORCINE) 5000 UNIT/ML IJ SOLN
5000.0000 [IU] | INTRAMUSCULAR | Status: AC
  Administered 2022-01-18: 5000 [IU] via SUBCUTANEOUS
  Filled 2022-01-18: qty 1

## 2022-01-18 MED ORDER — FERRIC SUBSULFATE 259 MG/GM EX SOLN
CUTANEOUS | Status: AC
  Filled 2022-01-18: qty 8

## 2022-01-18 MED ORDER — POVIDONE-IODINE 10 % EX SWAB
2.0000 | Freq: Once | CUTANEOUS | Status: AC
  Administered 2022-01-18: 2 via TOPICAL

## 2022-01-18 MED ORDER — BUPIVACAINE HCL 0.25 % IJ SOLN
INTRAMUSCULAR | Status: AC
  Filled 2022-01-18: qty 1

## 2022-01-18 MED ORDER — STERILE WATER FOR IRRIGATION IR SOLN
Status: DC | PRN
  Administered 2022-01-18: 500 mL

## 2022-01-18 MED ORDER — ORAL CARE MOUTH RINSE
15.0000 mL | Freq: Once | OROMUCOSAL | Status: AC
  Administered 2022-01-18: 15 mL via OROMUCOSAL

## 2022-01-18 MED ORDER — ONDANSETRON HCL 4 MG/2ML IJ SOLN
INTRAMUSCULAR | Status: AC
Start: 2022-01-18 — End: ?
  Filled 2022-01-18: qty 2

## 2022-01-18 MED ORDER — FENTANYL CITRATE PF 50 MCG/ML IJ SOSY
25.0000 ug | PREFILLED_SYRINGE | INTRAMUSCULAR | Status: DC | PRN
  Administered 2022-01-18: 50 ug via INTRAVENOUS

## 2022-01-18 MED ORDER — ORAL CARE MOUTH RINSE: 15.0000 mL | Freq: Once | OROMUCOSAL | Status: DC

## 2022-01-18 MED ORDER — PHENYLEPHRINE 80 MCG/ML (10ML) SYRINGE FOR IV PUSH (FOR BLOOD PRESSURE SUPPORT)
PREFILLED_SYRINGE | INTRAVENOUS | Status: DC | PRN
  Administered 2022-01-18: 80 ug via INTRAVENOUS

## 2022-01-18 MED ORDER — ACETIC ACID 5 % SOLN
Status: AC
  Filled 2022-01-18: qty 50

## 2022-01-18 MED ORDER — PHENYLEPHRINE 80 MCG/ML (10ML) SYRINGE FOR IV PUSH (FOR BLOOD PRESSURE SUPPORT)
PREFILLED_SYRINGE | INTRAVENOUS | Status: AC
  Filled 2022-01-18: qty 20

## 2022-01-18 MED ORDER — PROPOFOL 10 MG/ML IV BOLUS
INTRAVENOUS | Status: AC
  Filled 2022-01-18: qty 20

## 2022-01-18 MED ORDER — LIDOCAINE HCL (PF) 1 % IJ SOLN
INTRAMUSCULAR | Status: AC
  Filled 2022-01-18: qty 30

## 2022-01-18 MED ORDER — ACETAMINOPHEN 500 MG PO TABS
1000.0000 mg | ORAL_TABLET | ORAL | Status: AC
  Administered 2022-01-18: 1000 mg via ORAL
  Filled 2022-01-18: qty 2

## 2022-01-18 MED ORDER — DEXMEDETOMIDINE HCL IN NACL 80 MCG/20ML IV SOLN
INTRAVENOUS | Status: AC
  Filled 2022-01-18: qty 20

## 2022-01-18 MED ORDER — ROCURONIUM BROMIDE 10 MG/ML (PF) SYRINGE
PREFILLED_SYRINGE | INTRAVENOUS | Status: AC
  Filled 2022-01-18: qty 10

## 2022-01-18 MED ORDER — CHLORHEXIDINE GLUCONATE 0.12 % MT SOLN: 15.0000 mL | Freq: Once | OROMUCOSAL | Status: AC

## 2022-01-18 MED ORDER — FENTANYL CITRATE PF 50 MCG/ML IJ SOSY
PREFILLED_SYRINGE | INTRAMUSCULAR | Status: AC
  Filled 2022-01-18: qty 1

## 2022-01-18 MED ORDER — PROPOFOL 10 MG/ML IV BOLUS
INTRAVENOUS | Status: DC | PRN
  Administered 2022-01-18: 20 mg via INTRAVENOUS
  Administered 2022-01-18: 160 mg via INTRAVENOUS

## 2022-01-18 MED ORDER — FENTANYL CITRATE (PF) 250 MCG/5ML IJ SOLN
INTRAMUSCULAR | Status: DC | PRN
Start: 2022-01-18 — End: 2022-01-18
  Administered 2022-01-18 (×2): 50 ug via INTRAVENOUS

## 2022-01-18 MED ORDER — OXYCODONE HCL 5 MG PO TABS
5.0000 mg | ORAL_TABLET | Freq: Once | ORAL | Status: AC | PRN
  Administered 2022-01-18: 5 mg via ORAL

## 2022-01-18 MED ORDER — OXYCODONE HCL 5 MG PO TABS
ORAL_TABLET | ORAL | Status: AC
  Filled 2022-01-18: qty 1

## 2022-01-18 MED ORDER — LACTATED RINGERS IV SOLN: INTRAVENOUS | Status: DC

## 2022-01-18 MED ORDER — ACETAMINOPHEN 10 MG/ML IV SOLN: 1000.0000 mg | Freq: Once | INTRAVENOUS | Status: DC | PRN

## 2022-01-18 MED ORDER — OXYCODONE HCL 5 MG/5ML PO SOLN: 5.0000 mg | Freq: Once | ORAL | Status: AC | PRN

## 2022-01-18 MED ORDER — ACETAMINOPHEN 160 MG/5ML PO SOLN: 1000.0000 mg | Freq: Once | ORAL | Status: DC | PRN

## 2022-01-18 MED ORDER — AMISULPRIDE (ANTIEMETIC) 5 MG/2ML IV SOLN
INTRAVENOUS | Status: DC
Start: 2022-01-18 — End: 2022-01-18
  Filled 2022-01-18: qty 4

## 2022-01-18 MED ORDER — ACETAMINOPHEN 500 MG PO TABS: 1000.0000 mg | ORAL_TABLET | Freq: Once | ORAL | Status: DC | PRN

## 2022-01-18 MED ORDER — ONDANSETRON HCL 4 MG/2ML IJ SOLN
INTRAMUSCULAR | Status: DC | PRN
  Administered 2022-01-18: 4 mg via INTRAVENOUS

## 2022-01-18 MED ORDER — LIDOCAINE 2% (20 MG/ML) 5 ML SYRINGE
INTRAMUSCULAR | Status: DC | PRN
  Administered 2022-01-18: 60 mg via INTRAVENOUS

## 2022-01-18 MED ORDER — DEXMEDETOMIDINE (PRECEDEX) IN NS 20 MCG/5ML (4 MCG/ML) IV SYRINGE
PREFILLED_SYRINGE | INTRAVENOUS | Status: DC | PRN
  Administered 2022-01-18: 12 ug via INTRAVENOUS

## 2022-01-18 MED ORDER — AMISULPRIDE (ANTIEMETIC) 5 MG/2ML IV SOLN
10.0000 mg | Freq: Once | INTRAVENOUS | Status: AC
  Administered 2022-01-18: 10 mg via INTRAVENOUS

## 2022-01-18 MED ORDER — DEXAMETHASONE SODIUM PHOSPHATE 10 MG/ML IJ SOLN
INTRAMUSCULAR | Status: AC
  Filled 2022-01-18: qty 1

## 2022-01-18 MED ORDER — SCOPOLAMINE 1 MG/3DAYS TD PT72
1.0000 | MEDICATED_PATCH | TRANSDERMAL | Status: DC
  Administered 2022-01-18: 1.5 mg via TRANSDERMAL
  Filled 2022-01-18: qty 1

## 2022-01-18 MED ORDER — CHLORHEXIDINE GLUCONATE 0.12 % MT SOLN: 15.0000 mL | Freq: Once | OROMUCOSAL | Status: DC

## 2022-01-18 MED ORDER — ROCURONIUM BROMIDE 10 MG/ML (PF) SYRINGE
PREFILLED_SYRINGE | INTRAVENOUS | Status: DC | PRN
  Administered 2022-01-18: 40 mg via INTRAVENOUS

## 2022-01-18 MED ORDER — FENTANYL CITRATE (PF) 250 MCG/5ML IJ SOLN
INTRAMUSCULAR | Status: AC
  Filled 2022-01-18: qty 5

## 2022-01-18 MED ORDER — CELECOXIB 200 MG PO CAPS
200.0000 mg | ORAL_CAPSULE | ORAL | Status: AC
  Administered 2022-01-18: 200 mg via ORAL
  Filled 2022-01-18: qty 1

## 2022-01-18 MED ORDER — LIDOCAINE HCL (PF) 2 % IJ SOLN
INTRAMUSCULAR | Status: AC
Start: 2022-01-18 — End: ?
  Filled 2022-01-18: qty 15

## 2022-01-18 MED ORDER — MIDAZOLAM HCL 2 MG/2ML IJ SOLN
INTRAMUSCULAR | Status: DC | PRN
  Administered 2022-01-18: 2 mg via INTRAVENOUS

## 2022-01-18 MED ORDER — DEXAMETHASONE SODIUM PHOSPHATE 4 MG/ML IJ SOLN: 4.0000 mg | INTRAMUSCULAR | Status: DC

## 2022-01-18 MED ORDER — DEXAMETHASONE SODIUM PHOSPHATE 10 MG/ML IJ SOLN
INTRAMUSCULAR | Status: DC | PRN
  Administered 2022-01-18: 6 mg via INTRAVENOUS

## 2022-01-18 MED ORDER — SUGAMMADEX SODIUM 200 MG/2ML IV SOLN
INTRAVENOUS | Status: DC | PRN
  Administered 2022-01-18: 120 mg via INTRAVENOUS

## 2022-01-18 MED ORDER — MIDAZOLAM HCL 2 MG/2ML IJ SOLN
INTRAMUSCULAR | Status: AC
Start: 2022-01-18 — End: ?
  Filled 2022-01-18: qty 2

## 2022-01-18 SURGICAL SUPPLY — 103 items
ADH SKN CLS APL DERMABOND .7 (GAUZE/BANDAGES/DRESSINGS)
AGENT HMST KT MTR STRL THRMB (HEMOSTASIS)
APL ESCP 34 STRL LF DISP (HEMOSTASIS)
APL PRP STRL LF DISP 70% ISPRP (MISCELLANEOUS)
APPLICATOR SURGIFLO ENDO (HEMOSTASIS) IMPLANT
BAG COUNTER SPONGE SURGICOUNT (BAG) IMPLANT
BAG LAPAROSCOPIC 12 15 PORT 16 (BASKET) IMPLANT
BAG RETRIEVAL 12/15 (BASKET)
BAG SPNG CNTER NS LX DISP (BAG)
BLADE EXTENDED COATED 6.5IN (ELECTRODE) ×1 IMPLANT
BLADE SURG SZ10 CARB STEEL (BLADE) IMPLANT
BNDG HMST INTVN QUIKCLOT (GAUZE/BANDAGES/DRESSINGS) ×1
CABLE HIGH FREQUENCY MONO STRZ (ELECTRODE) IMPLANT
CHLORAPREP W/TINT 26 (MISCELLANEOUS) ×1 IMPLANT
CNTNR URN SCR LID CUP LEK RST (MISCELLANEOUS) IMPLANT
CONT SPEC 4OZ STRL OR WHT (MISCELLANEOUS)
COVER BACK TABLE 60X90IN (DRAPES) ×2 IMPLANT
COVER SURGICAL LIGHT HANDLE (MISCELLANEOUS) ×2 IMPLANT
COVER TIP SHEARS 8 DVNC (MISCELLANEOUS) ×1 IMPLANT
COVER TIP SHEARS 8MM DA VINCI (MISCELLANEOUS)
DERMABOND ADVANCED (GAUZE/BANDAGES/DRESSINGS)
DERMABOND ADVANCED .7 DNX12 (GAUZE/BANDAGES/DRESSINGS) ×1 IMPLANT
DRAPE ARM DVNC X/XI (DISPOSABLE) ×4 IMPLANT
DRAPE COLUMN DVNC XI (DISPOSABLE) ×1 IMPLANT
DRAPE DA VINCI XI ARM (DISPOSABLE)
DRAPE DA VINCI XI COLUMN (DISPOSABLE)
DRAPE SHEET LG 3/4 BI-LAMINATE (DRAPES) ×1 IMPLANT
DRAPE SURG IRRIG POUCH 19X23 (DRAPES) ×2 IMPLANT
DRSG HEMOSTATIC QUIKCLOT (GAUZE/BANDAGES/DRESSINGS) ×1 IMPLANT
DRSG OPSITE POSTOP 4X6 (GAUZE/BANDAGES/DRESSINGS) IMPLANT
DRSG OPSITE POSTOP 4X8 (GAUZE/BANDAGES/DRESSINGS) IMPLANT
ELECT PENCIL ROCKER SW 15FT (MISCELLANEOUS) ×1 IMPLANT
ELECT REM PT RETURN 15FT ADLT (MISCELLANEOUS) ×2 IMPLANT
GAUZE 4X4 16PLY ~~LOC~~+RFID DBL (SPONGE) ×4 IMPLANT
GLOVE BIO SURGEON STRL SZ 6 (GLOVE) ×4 IMPLANT
GLOVE BIO SURGEON STRL SZ 6.5 (GLOVE) ×2 IMPLANT
GLOVE SURG SS PI 6.0 STRL IVOR (GLOVE) ×3 IMPLANT
GLOVE SURG SS PI 6.5 STRL IVOR (GLOVE) ×1 IMPLANT
GOWN STRL REUS W/ TWL LRG LVL3 (GOWN DISPOSABLE) ×4 IMPLANT
GOWN STRL REUS W/TWL LRG LVL3 (GOWN DISPOSABLE) ×4
HOLDER FOLEY CATH W/STRAP (MISCELLANEOUS) IMPLANT
IRRIG SUCT STRYKERFLOW 2 WTIP (MISCELLANEOUS)
IRRIGATION SUCT STRKRFLW 2 WTP (MISCELLANEOUS) ×1 IMPLANT
KIT BASIN OR (CUSTOM PROCEDURE TRAY) ×1 IMPLANT
KIT PROCEDURE DA VINCI SI (MISCELLANEOUS)
KIT PROCEDURE DVNC SI (MISCELLANEOUS) IMPLANT
KIT TURNOVER KIT A (KITS) IMPLANT
LIGASURE IMPACT 36 18CM CVD LR (INSTRUMENTS) IMPLANT
MANIPULATOR ADVINCU DEL 3.0 PL (MISCELLANEOUS) IMPLANT
MANIPULATOR ADVINCU DEL 3.5 PL (MISCELLANEOUS) IMPLANT
MANIPULATOR UTERINE 4.5 ZUMI (MISCELLANEOUS) IMPLANT
NDL BIOPSY 14X6 SOFT TISS (NEEDLE) IMPLANT
NDL HYPO 21X1.5 SAFETY (NEEDLE) ×1 IMPLANT
NDL SPNL 18GX3.5 QUINCKE PK (NEEDLE) IMPLANT
NEEDLE BIOPSY 14X6 SOFT TISS (NEEDLE) ×2 IMPLANT
NEEDLE HYPO 21X1.5 SAFETY (NEEDLE) IMPLANT
NEEDLE SPNL 18GX3.5 QUINCKE PK (NEEDLE) IMPLANT
OBTURATOR OPTICAL STANDARD 8MM (TROCAR)
OBTURATOR OPTICAL STND 8 DVNC (TROCAR)
OBTURATOR OPTICALSTD 8 DVNC (TROCAR) ×1 IMPLANT
PACK ROBOT GYN CUSTOM WL (TRAY / TRAY PROCEDURE) ×2 IMPLANT
PAD POSITIONING PINK XL (MISCELLANEOUS) ×2 IMPLANT
PORT ACCESS TROCAR AIRSEAL 12 (TROCAR) ×1 IMPLANT
PORT ACCESS TROCAR AIRSEAL 5M (TROCAR)
SCISSORS LAP 5X35 DISP (ENDOMECHANICALS) IMPLANT
SCOPETTES 8  STERILE (MISCELLANEOUS) ×2
SCOPETTES 8 STERILE (MISCELLANEOUS) IMPLANT
SCRUB CHG 4% DYNA-HEX 4OZ (MISCELLANEOUS) ×4 IMPLANT
SEAL CANN UNIV 5-8 DVNC XI (MISCELLANEOUS) ×4 IMPLANT
SEAL XI 5MM-8MM UNIVERSAL (MISCELLANEOUS)
SEALER TISSUE G2 CVD JAW 45CM (ENDOMECHANICALS) IMPLANT
SET TRI-LUMEN FLTR TB AIRSEAL (TUBING) ×1 IMPLANT
SHEET LAVH (DRAPES) ×1 IMPLANT
SLEEVE Z-THREAD 5X100MM (TROCAR) ×1 IMPLANT
SPIKE FLUID TRANSFER (MISCELLANEOUS) ×2 IMPLANT
SPONGE T-LAP 18X18 ~~LOC~~+RFID (SPONGE) IMPLANT
SURGIFLO W/THROMBIN 8M KIT (HEMOSTASIS) IMPLANT
SUT MNCRL AB 4-0 PS2 18 (SUTURE) ×2 IMPLANT
SUT PDS AB 1 TP1 96 (SUTURE) IMPLANT
SUT VIC AB 0 CT1 27 (SUTURE)
SUT VIC AB 0 CT1 27XBRD ANTBC (SUTURE) IMPLANT
SUT VIC AB 2-0 CT1 27 (SUTURE)
SUT VIC AB 2-0 CT1 TAPERPNT 27 (SUTURE) IMPLANT
SUT VIC AB 4-0 PS2 18 (SUTURE) ×2 IMPLANT
SYR 10ML LL (SYRINGE) IMPLANT
SYS BAG RETRIEVAL 10MM (BASKET)
SYS RETRIEVAL 5MM INZII UNIV (BASKET)
SYS WOUND ALEXIS 18CM MED (MISCELLANEOUS)
SYSTEM BAG RETRIEVAL 10MM (BASKET) IMPLANT
SYSTEM RETRIEVL 5MM INZII UNIV (BASKET) IMPLANT
SYSTEM WOUND ALEXIS 18CM MED (MISCELLANEOUS) IMPLANT
TOWEL OR 17X26 10 PK STRL BLUE (TOWEL DISPOSABLE) ×2 IMPLANT
TOWEL OR NON WOVEN STRL DISP B (DISPOSABLE) ×2 IMPLANT
TRAP SPECIMEN MUCUS 40CC (MISCELLANEOUS) IMPLANT
TRAY FOLEY MTR SLVR 16FR STAT (SET/KITS/TRAYS/PACK) ×1 IMPLANT
TRAY LAPAROSCOPIC (CUSTOM PROCEDURE TRAY) ×1 IMPLANT
TROCAR ADV FIXATION 12X100MM (TROCAR) IMPLANT
TROCAR BALLN 12MMX100 BLUNT (TROCAR) IMPLANT
TROCAR Z-THREAD FIOS 5X100MM (TROCAR) IMPLANT
TROCAR Z-THREAD OPTICAL 5X100M (TROCAR) ×1 IMPLANT
UNDERPAD 30X36 HEAVY ABSORB (UNDERPADS AND DIAPERS) ×4 IMPLANT
WATER STERILE IRR 1000ML POUR (IV SOLUTION) ×2 IMPLANT
YANKAUER SUCT BULB TIP 10FT TU (MISCELLANEOUS) ×1 IMPLANT

## 2022-01-18 NOTE — Discharge Instructions (Addendum)

## 2022-01-18 NOTE — Op Note (Signed)
OPERATIVE NOTE  PATIENT: Tiffany [name] DATE: 01/18/22  Preop Diagnosis: Large FDG avid cervical  mass, bilateral FDG avid adnexal masses, cervical mass biopsy atypical but not diagnostic  Postoperative Diagnosis: Malignant cervical mass  Surgery: Exam under anesthesia, cervical biopsies  Surgeons:  Valarie Cones, MD  Assistant: none  Anesthesia: General   Estimated blood loss: 50 ml  IVF: See I&O flowsheet  Urine output: n/a   Complications: None apparent  Pathology: Focal biopsies, some sent as frozen section some is permanent  Operative findings: On bimanual exam, large approximately 8 cm mass is noted to be replacing the cervix.  This can be palpated circumferentially with no obvious attachment to the vagina and no normal cervix visualized or appreciated on palpation.  Visually, the mass is somewhat necrotic in appearance with areas that are white or dusky.  Some areas appear more viable (targeted with biopsies).  No discharge noted.  Mass itself is somewhat friable and bleeds easily. On rectovaginal exam, the rectum is in close proximity to the posterior aspect of the cervix without any direct invasion.  Minimal mobility of the mass in the cul-de-sac, only appreciated on rectal exam. Frozen section, much of biopsies is necrotic or inflamed.  Several small clusters of cells are identified confirming malignancy.  Additional characterization will be deferred to permanent section.  Procedure: The patient was identified in the preoperative holding area. Informed consent was signed on the chart. Patient was seen history was reviewed and exam was performed.   The patient was then taken to the operating room and placed in the supine position with SCD hose on. General anesthesia was then induced without difficulty. She was then placed in the dorsolithotomy position. The perineum was prepped with Betadine. The vagina was prepped with chlorhexidine.  The abdomen was also prepped with  chlorhexidine.  The patient was then draped after the prep was dried.   Timeout was performed the patient, procedure, antibiotic, allergy, and length of procedure.   The speculum was placed in the vagina.  Multiple biopsies were taken with Tischler forceps and several deeper Tru-Cut biopsies taken.  These were all placed on Telfa and sent to pathology for frozen section.  Additional biopsies were taken for permanent.  After verification of sufficient tissue for diagnosis, monopolar electrocautery, Monsel solution, and pressure were used to achieve hemostasis.    The vagina was irrigated.  Given small amount of oozing noted, quick clot gauze was used to pack the vagina with plan to remove the packing in the recovery room.  All instrument, suture, laparotomy, Ray-Tec, and needle counts were correct x2. The patient tolerated the procedure well and was taken recovery room in stable condition.   [name], MD

## 2022-01-18 NOTE — Transfer of Care (Signed)
Immediate Anesthesia Transfer of Care Note  Patient: Tiffany [name]  Procedure(s) Performed: EXAM UNDER ANESTHESIA CERVICAL BIOPSIES  Patient Location: PACU  Anesthesia Type:General  Level of Consciousness: drowsy  Airway & Oxygen Therapy: Patient Spontanous Breathing and Patient connected to face mask oxygen  Post-op Assessment: Report given to RN and Post -op Vital signs reviewed and stable  Post vital signs: Reviewed and stable  Last Vitals:  Vitals Value Taken Time  BP 135/81 01/18/22 1547  Temp    Pulse 113 01/18/22 1548  Resp 20 01/18/22 1548  SpO2 98 % 01/18/22 1548  Vitals shown include unvalidated device data.  Last Pain:  Vitals:   01/18/22 1311  TempSrc:   PainSc: 0-No pain         Complications: No notable events documented.

## 2022-01-18 NOTE — Anesthesia Procedure Notes (Signed)
Procedure Name: Intubation Date/Time: 01/18/2022 2:20 PM  Performed by: Sharlette Dense, CRNAPre-anesthesia Checklist: Patient identified, Emergency Drugs available, Suction available and Patient being monitored Patient Re-evaluated:Patient Re-evaluated prior to induction Oxygen Delivery Method: Circle system utilized Preoxygenation: Pre-oxygenation with 100% oxygen Induction Type: IV induction Ventilation: Mask ventilation without difficulty Laryngoscope Size: Miller and 2 Grade View: Grade I Tube type: Oral Tube size: 7.0 mm Number of attempts: 1 Airway Equipment and Method: Stylet and Oral airway Placement Confirmation: ETT inserted through vocal cords under direct vision, positive ETCO2 and breath sounds checked- equal and bilateral Secured at: 19 cm Tube secured with: Tape Dental Injury: Teeth and Oropharynx as per pre-operative assessment

## 2022-01-18 NOTE — Interval H&P Note (Signed)
History and Physical Interval Note:  01/18/2022 1:28 PM  Tiffany [name]  has presented today for surgery, with the diagnosis of BILATERAL OVARIAN MASSES, ELEVATED TUMOR MARKERS,PROLAPSING INTRA-VAGINAL MASS.  The various methods of treatment have been discussed with the patient and family. After consideration of risks, benefits and other options for treatment, the patient has consented to  Procedure(s): EXAM UNDER ANESTHESIA (N/A) POSSIBLE BIOPSIES OF INTRA-VAGINAL MASS (N/A) POSSIBLE LAPAROSCOPY DIAGNOSTIC WITH BIOPSIES (N/A) POSSIBLE XI ROBOTIC ASSISTED UNILATERAL SALPINGO OOPHORECTOMY, POSSIBLE BILATERAL (N/A) as a surgical intervention.  The patient's history has been reviewed, patient examined, no change in status, stable for surgery.  I have reviewed the patient's chart and labs.  Questions were answered to the patient's satisfaction.     [name]

## 2022-01-18 NOTE — Anesthesia Preprocedure Evaluation (Signed)
Anesthesia Evaluation  Patient identified by MRN, date of birth, ID band Patient awake    Reviewed: Allergy & Precautions, NPO status , Patient's Chart, lab work & pertinent test results  History of Anesthesia Complications Negative for: history of anesthetic complications  Airway Mallampati: I  TM Distance: >3 FB Neck ROM: Full    Dental  (+) Teeth Intact, Dental Advisory Given   Pulmonary neg pulmonary ROS,    breath sounds clear to auscultation       Cardiovascular negative cardio ROS   Rhythm:Regular     Neuro/Psych negative neurological ROS     GI/Hepatic Neg liver ROS, GERD  ,  Endo/Other  Hypothyroidism   Renal/GU negative Renal ROS     Musculoskeletal  (+) Arthritis ,   Abdominal   Peds  Hematology negative hematology ROS (+) Lab Results      Component                Value               Date                      WBC                      8.2                 01/05/2022                HGB                      13.6                01/05/2022                HCT                      41.5                01/05/2022                MCV                      76.4 (L)            01/05/2022                PLT                      304                 01/05/2022              Anesthesia Other Findings BILATERAL OVARIAN MASSES, ELEVATED TUMOR MARKERS,PROLAPSING INTRA-VAGINAL MASS  Reproductive/Obstetrics                             Anesthesia Physical Anesthesia Plan  ASA: 2  Anesthesia Plan: General   Post-op Pain Management: Tylenol PO (pre-op)* and Celebrex PO (pre-op)*   Induction: Intravenous  PONV Risk Score and Plan: 3 and Ondansetron and Dexamethasone  Airway Management Planned: Oral ETT  Additional Equipment: None  Intra-op Plan:   Post-operative Plan: Extubation in OR  Informed Consent: I have reviewed the patients History and Physical, chart, labs and discussed the  procedure including the risks, benefits and alternatives for the proposed anesthesia with the patient  or authorized representative who has indicated his/her understanding and acceptance.     Dental advisory given  Plan Discussed with: CRNA  Anesthesia Plan Comments:         Anesthesia Quick Evaluation

## 2022-01-18 NOTE — Anesthesia Postprocedure Evaluation (Signed)
Anesthesia Post Note  Patient: Tiffany [name]  Procedure(s) Performed: [place]     Patient location during evaluation: PACU Anesthesia Type: General Level of consciousness: awake and alert Pain management: pain level controlled Vital Signs Assessment: post-procedure vital signs reviewed and stable Respiratory status: spontaneous breathing, nonlabored ventilation, respiratory function stable and patient connected to nasal cannula oxygen Cardiovascular status: blood pressure returned to baseline and stable Postop Assessment: no apparent nausea or vomiting Anesthetic complications: no   No notable events documented.  Last Vitals:  Vitals:   01/18/22 1645 01/18/22 1700  BP: 132/78 130/79  Pulse: 90 84  Resp: (!) 21 (!) 23  Temp:    SpO2: 94% 94%    Last Pain:  Vitals:   01/18/22 1640  TempSrc: Oral  PainSc: 3                  Jahshua Bonito

## 2022-01-19 ENCOUNTER — Telehealth: Payer: Self-pay

## 2022-01-19 ENCOUNTER — Encounter (HOSPITAL_COMMUNITY): Payer: Self-pay | Admitting: Gynecologic Oncology

## 2022-01-19 NOTE — Telephone Encounter (Signed)
Spoke with Ms. Moree this afternoon. She states she is eating, drinking and urinating well. She has some slight burning with urination, advised to use a peri-bottle and monitor symptoms. If this persists, worsen or she develops any other symptoms to contact the office. She endorses a sore throat and feeling like something is caught in her throat. She denies difficulty breathing. Advised this could be from intubation yesterday and to gargle with warm salt water.   She has not had a BM yet but is passing gas. She is taking senokot as prescribed and encouraged her to drink plenty of water.  She denies fever or chills. She endorses light vaginal spotting which is expected, advised to monitor.  She rates her pain 7-8/10. Pain is worse with movement and standing. Laying down helps. Her pain is controlled with tylenol and ibuprofen (her last dose was at 4am this morning).  Instructed to alternate taking ibuprofen and tylenol being careful not to exceed 4,000 mg of tylenol in a 24 hour period. Patient verbalized understanding.   Instructed to call office with any fever, chills, purulent drainage, uncontrolled pain or any other questions or concerns. Patient verbalizes understanding.   Pt aware of post op appointments as well as the office number [phone] and after hours number [phone] to call if she has any questions or concerns

## 2022-01-20 ENCOUNTER — Telehealth: Payer: Self-pay | Admitting: Gynecologic Oncology

## 2022-01-20 ENCOUNTER — Telehealth: Payer: Self-pay | Admitting: *Deleted

## 2022-01-20 DIAGNOSIS — C579 Malignant neoplasm of female genital organ, unspecified: Secondary | ICD-10-CM

## 2022-01-20 NOTE — Telephone Encounter (Signed)
Pt called stating that she saw her biopsy results on her mychart and does not understand them and would like Dr.Tucker to call her back. Informed pt that someone will reach back out to her. She verbalized understanding.

## 2022-01-20 NOTE — Addendum Note (Signed)
Addended by: [name] on: 01/20/2022 05:46 PM   Modules accepted: Orders

## 2022-01-20 NOTE — Telephone Encounter (Signed)
Spoke with Bryson Ha in Pathology and ordered HER 2 with pathology for pt.

## 2022-01-20 NOTE — Telephone Encounter (Signed)
Had a long conversation with the patient and her son about biopsy results, which show high-grade serous carcinoma.  This is favored to represent uterine or ovarian origin.  Given imaging findings as well as her exam, I would favor uterine origin.  Have asked pathology to add HER2 testing.  Reviewed that her appointment on Tuesday is with Dr. Alvy Bimler to talk about neoadjuvant treatment.  Plan will be for 3 cycles of chemotherapy followed by repeat imaging to assess feasibility and candidacy for surgery.  Port insertion ordered.  Jeral Pinch MD Gynecologic Oncology

## 2022-01-20 NOTE — Telephone Encounter (Signed)
Called the patient to discuss biopsy results from surgery earlier this week.  No answer.  Left message letting her know I will try her back soon.  Jeral Pinch MD Gynecologic Oncology

## 2022-01-23 ENCOUNTER — Telehealth: Payer: Self-pay | Admitting: *Deleted

## 2022-01-23 ENCOUNTER — Encounter: Payer: Self-pay | Admitting: Gynecologic Oncology

## 2022-01-23 DIAGNOSIS — C55 Malignant neoplasm of uterus, part unspecified: Secondary | ICD-10-CM | POA: Insufficient documentation

## 2022-01-23 NOTE — Telephone Encounter (Signed)
Patient called and stated "I want to know why I have to wait other 8 days. I'm I going to start chemo that day? I am willing to go anywhere at anytime in [facility] system or out to get the port placed sooner." Explained that the message would be given to Dr Chancy Hurter APP and the office would call her back later today. Explained that when she sees Dr Alvy Bimler tomorrow she would give her the chemo start date.

## 2022-01-23 NOTE — Telephone Encounter (Signed)
Spoke with pt this morning who stated that for the past 2 days she's been noticing black ash discharge from the vaginal area when she wipes and a little bit on her panties. The amount is only a little bit every time she wipes. She denies odor, pain, fever, chills, nausea or vomiting as well. Per Joylene John, NP pt should continue to observe the symptoms and if it worsens, increases, has bright red blood, experiences pain, nauseas, vomiting, fever to call into the office and let us know. She verbalized understanding and did not have any other questions.

## 2022-01-23 NOTE — Telephone Encounter (Signed)
Spoke with [name] at [facility] and moved the patient's port placement from 8/16 to 8/9. Called the patient and gave the new date/time. Also gave the instructions of arrive at 9 am, NPO after midnight, need a driver/someone to stay with her 24 hrs after procedure and can take morning meds with sips of water.   Patient verbalized understanding with the appt date/time and was happy for the new day. Patient stated "will I start chemo the next day?" Explained "Again that is up to Dr Alvy Bimler and her insurance will have to approve the drugs before she starts chemo. The cancer center doesn't want her to suck with a huge bill."   Patient again verbalized understanding

## 2022-01-23 NOTE — Telephone Encounter (Signed)
Patient called the office and asked "Why am I coming to see that doctor tomorrow and what is the appt for? Why am I am seeing her if the chemo is not even scheduled? Can I have chemo the same day that the port is being placed? I have all this appts scheduled but nothing is being done. Can the appt be done over the phone? Why do I have to have a phone visit with Dr Berline Lopes also tomorrow? I am tired and in pain; its been 2 months and nothing is being done."   Explained that "The appt with Dr Alvy Bimler tomorrow is with the Medical Oncologist. She will discuss the chemo meds with you at that appt, along with the side effects and the drug outcomes. Dr Alvy Bimler will schedule your several next appts for chemo/MD/lab appts. The office is still trying to call around and find a sooner date for your port placement."  Patient verbalized understanding and will keep the appt for Dr Alvy Bimler.

## 2022-01-24 ENCOUNTER — Inpatient Hospital Stay: Payer: No Typology Code available for payment source | Admitting: Gynecologic Oncology

## 2022-01-24 ENCOUNTER — Other Ambulatory Visit: Payer: Self-pay

## 2022-01-24 ENCOUNTER — Other Ambulatory Visit: Payer: Self-pay | Admitting: Radiology

## 2022-01-24 ENCOUNTER — Encounter: Payer: Self-pay | Admitting: Hematology and Oncology

## 2022-01-24 ENCOUNTER — Telehealth: Payer: Self-pay | Admitting: Gynecologic Oncology

## 2022-01-24 ENCOUNTER — Inpatient Hospital Stay
Payer: No Typology Code available for payment source | Attending: Gynecologic Oncology | Admitting: Hematology and Oncology

## 2022-01-24 ENCOUNTER — Other Ambulatory Visit (HOSPITAL_COMMUNITY): Payer: Self-pay

## 2022-01-24 ENCOUNTER — Telehealth: Payer: Self-pay | Admitting: *Deleted

## 2022-01-24 VITALS — BP 145/77 | HR 108 | Temp 99.5°F | Resp 18 | Ht 63.0 in | Wt 126.6 lb

## 2022-01-24 DIAGNOSIS — E039 Hypothyroidism, unspecified: Secondary | ICD-10-CM | POA: Diagnosis not present

## 2022-01-24 DIAGNOSIS — Z5111 Encounter for antineoplastic chemotherapy: Secondary | ICD-10-CM | POA: Insufficient documentation

## 2022-01-24 DIAGNOSIS — Z809 Family history of malignant neoplasm, unspecified: Secondary | ICD-10-CM | POA: Insufficient documentation

## 2022-01-24 DIAGNOSIS — K8689 Other specified diseases of pancreas: Secondary | ICD-10-CM | POA: Insufficient documentation

## 2022-01-24 DIAGNOSIS — I700 Atherosclerosis of aorta: Secondary | ICD-10-CM | POA: Diagnosis not present

## 2022-01-24 DIAGNOSIS — F419 Anxiety disorder, unspecified: Secondary | ICD-10-CM | POA: Insufficient documentation

## 2022-01-24 DIAGNOSIS — Q631 Lobulated, fused and horseshoe kidney: Secondary | ICD-10-CM | POA: Diagnosis not present

## 2022-01-24 DIAGNOSIS — D259 Leiomyoma of uterus, unspecified: Secondary | ICD-10-CM | POA: Diagnosis not present

## 2022-01-24 DIAGNOSIS — Z79899 Other long term (current) drug therapy: Secondary | ICD-10-CM | POA: Insufficient documentation

## 2022-01-24 DIAGNOSIS — Z5112 Encounter for antineoplastic immunotherapy: Secondary | ICD-10-CM | POA: Diagnosis present

## 2022-01-24 DIAGNOSIS — Z803 Family history of malignant neoplasm of breast: Secondary | ICD-10-CM | POA: Insufficient documentation

## 2022-01-24 DIAGNOSIS — R64 Cachexia: Secondary | ICD-10-CM | POA: Insufficient documentation

## 2022-01-24 DIAGNOSIS — N83202 Unspecified ovarian cyst, left side: Secondary | ICD-10-CM | POA: Diagnosis not present

## 2022-01-24 DIAGNOSIS — F32A Depression, unspecified: Secondary | ICD-10-CM | POA: Insufficient documentation

## 2022-01-24 DIAGNOSIS — Z7952 Long term (current) use of systemic steroids: Secondary | ICD-10-CM | POA: Insufficient documentation

## 2022-01-24 DIAGNOSIS — N83201 Unspecified ovarian cyst, right side: Secondary | ICD-10-CM | POA: Diagnosis not present

## 2022-01-24 DIAGNOSIS — R634 Abnormal weight loss: Secondary | ICD-10-CM | POA: Insufficient documentation

## 2022-01-24 DIAGNOSIS — N950 Postmenopausal bleeding: Secondary | ICD-10-CM | POA: Diagnosis not present

## 2022-01-24 DIAGNOSIS — C539 Malignant neoplasm of cervix uteri, unspecified: Secondary | ICD-10-CM | POA: Diagnosis not present

## 2022-01-24 DIAGNOSIS — Z8349 Family history of other endocrine, nutritional and metabolic diseases: Secondary | ICD-10-CM | POA: Diagnosis not present

## 2022-01-24 DIAGNOSIS — N898 Other specified noninflammatory disorders of vagina: Secondary | ICD-10-CM | POA: Insufficient documentation

## 2022-01-24 MED ORDER — LIDOCAINE-PRILOCAINE 2.5-2.5 % EX CREA
TOPICAL_CREAM | CUTANEOUS | 3 refills | Status: DC
  Filled 2022-01-24: qty 30, 30d supply, fill #0

## 2022-01-24 MED ORDER — ONDANSETRON HCL 8 MG PO TABS
8.0000 mg | ORAL_TABLET | Freq: Three times a day (TID) | ORAL | 1 refills | Status: DC | PRN
  Filled 2022-01-24: qty 30, 10d supply, fill #0

## 2022-01-24 MED ORDER — MIRTAZAPINE 15 MG PO TABS
15.0000 mg | ORAL_TABLET | Freq: Every day | ORAL | 3 refills | Status: DC
  Filled 2022-01-24: qty 30, 30d supply, fill #0

## 2022-01-24 MED ORDER — DEXAMETHASONE 4 MG PO TABS
ORAL_TABLET | ORAL | 6 refills | Status: DC
  Filled 2022-01-24: qty 4, 21d supply, fill #0
  Filled 2022-02-15: qty 4, 21d supply, fill #1
  Filled 2022-03-08: qty 4, 21d supply, fill #2
  Filled 2022-03-28: qty 4, 21d supply, fill #3

## 2022-01-24 MED ORDER — PROCHLORPERAZINE MALEATE 10 MG PO TABS
10.0000 mg | ORAL_TABLET | Freq: Four times a day (QID) | ORAL | 1 refills | Status: DC | PRN
  Filled 2022-01-24: qty 30, 8d supply, fill #0

## 2022-01-24 NOTE — Telephone Encounter (Signed)
Returned patient's call.  Discussed about her upcoming appointment with Dr. Alvy Bimler today.  She is aware that Dr. Alvy Bimler and I have talked about biopsy results and plan for neoadjuvant chemotherapy.  I have offered reassurance.  I have asked the patient to call me after her visit if she has additional concerns or questions.  Jeral Pinch MD Gynecologic Oncology

## 2022-01-24 NOTE — Progress Notes (Signed)
START OFF PATHWAY REGIMEN - Uterine   [id number]:Carboplatin AUC=5 IV D1 + Dostarlimab 500 mg IV D1 + Paclitaxel 175 mg/m2 IV D1 x 6 Cycles Followed by Dostarlimab 1,000 mg IV D1 q42 Days:   Cycles 1 through 6: A cycle is every 21 days:     Dostarlimab-gxly      Paclitaxel      Carboplatin    Cycles 7 and beyond: A cycle is every 42 days:     Dostarlimab-gxly   **Always confirm dose/schedule in your pharmacy ordering system**  Patient Characteristics: Serous Carcinoma, Newly Diagnosed (Clinical Staging), Nonsurgical Candidate, Stage III or IV, HER2 Negative/Unknown, MSS/pMMR Histology: Serous Carcinoma Therapeutic Status: Newly Diagnosed (Clinical Staging) AJCC M Category: cM0 Surgical Candidacy: Nonsurgical Candidate AJCC 8 Stage Grouping: III AJCC T Category: cT3 AJCC N Category: cN0 HER2 Status: Negative Microsatellite/Mismatch Repair Status: MSS/pMMR Intent of Therapy: Curative Intent, Discussed with Patient

## 2022-01-24 NOTE — Telephone Encounter (Signed)
Patient called the office after hours and left a message stating "I want to talk with Dr Berline Lopes before my appt tomorrow with Dr Alvy Bimler. I have read some of her reviews online and want to know if there is another doctor that Dr Berline Lopes can refer me too."   Called the patient back and left a message explaining "The our office has received your message and gave it to Dr Berline Lopes. Dr Berline Lopes is in the [place] today but will try to call you between her cases. The message was also given to [facility] APP and they both eager you to still come to the appt today. Please feel free to call the office back with any questions or concerns."

## 2022-01-25 ENCOUNTER — Encounter: Payer: Self-pay | Admitting: Hematology and Oncology

## 2022-01-25 ENCOUNTER — Telehealth: Payer: Managed Care, Other (non HMO) | Admitting: Gynecologic Oncology

## 2022-01-25 ENCOUNTER — Ambulatory Visit (HOSPITAL_COMMUNITY)
Admission: RE | Admit: 2022-01-25 | Discharge: 2022-01-25 | Disposition: A | Discharge status: Home / Self Care | Payer: No Typology Code available for payment source | Source: Ambulatory Visit | Attending: Gynecologic Oncology | Admitting: Gynecologic Oncology

## 2022-01-25 ENCOUNTER — Other Ambulatory Visit: Payer: Self-pay

## 2022-01-25 DIAGNOSIS — C539 Malignant neoplasm of cervix uteri, unspecified: Secondary | ICD-10-CM | POA: Insufficient documentation

## 2022-01-25 DIAGNOSIS — R64 Cachexia: Secondary | ICD-10-CM | POA: Insufficient documentation

## 2022-01-25 DIAGNOSIS — C579 Malignant neoplasm of female genital organ, unspecified: Secondary | ICD-10-CM

## 2022-01-25 HISTORY — PX: IR IMAGING GUIDED PORT INSERTION: IMG5740

## 2022-01-25 MED ORDER — FENTANYL CITRATE (PF) 100 MCG/2ML IJ SOLN
INTRAMUSCULAR | Status: AC | PRN
  Administered 2022-01-25 (×2): 50 ug via INTRAVENOUS

## 2022-01-25 MED ORDER — MIDAZOLAM HCL 2 MG/2ML IJ SOLN
INTRAMUSCULAR | Status: AC
  Filled 2022-01-25: qty 4

## 2022-01-25 MED ORDER — LIDOCAINE-EPINEPHRINE 1 %-1:100000 IJ SOLN
INTRAMUSCULAR | Status: AC
  Filled 2022-01-25: qty 1

## 2022-01-25 MED ORDER — SODIUM CHLORIDE 0.9 % IV SOLN: INTRAVENOUS | Status: DC

## 2022-01-25 MED ORDER — HEPARIN SOD (PORK) LOCK FLUSH 100 UNIT/ML IV SOLN
INTRAVENOUS | Status: AC
  Filled 2022-01-25: qty 5

## 2022-01-25 MED ORDER — MIDAZOLAM HCL 2 MG/2ML IJ SOLN
INTRAMUSCULAR | Status: AC | PRN
  Administered 2022-01-25 (×2): 1 mg via INTRAVENOUS

## 2022-01-25 MED ORDER — FENTANYL CITRATE (PF) 100 MCG/2ML IJ SOLN
INTRAMUSCULAR | Status: AC
  Filled 2022-01-25: qty 4

## 2022-01-25 NOTE — H&P (Signed)
Chief Complaint: Patient was seen in consultation today for port placement   Referring Physician(s): Tucker,Katherine R  Supervising Physician: Daryll Brod  Patient Status: [facility] - Out-pt  History of Present Illness: Tiffany [name] is a 61 y.o. female with uterine cancer. She is referred for port placement. PMHx, meds, labs, imaging, allergies reviewed. Feels well, no recent fevers, chills, illness. Has been NPO today as directed.    Past Medical History:  Diagnosis Date   Anxiety    Depression    GERD (gastroesophageal reflux disease)    Hypothyroidism    not on medications currently    Past Surgical History:  Procedure Laterality Date   CESAREAN SECTION     DILATION AND CURETTAGE OF UTERUS     61yo for SAB   LESION REMOVAL N/A 01/18/2022   Procedure: CERVICAL BIOPSIES;  Surgeon: [name], MD;  Location: [facility] ORS;  Service: Gynecology;  Laterality: N/A;    Allergies: Seasonal ic [octacosanol] and Latex  Medications: Prior to Admission medications   Medication Sig Start Date End Date Taking? Authorizing Provider  ALPRAZolam (XANAX) 0.25 MG tablet Take 0.25 mg by mouth every 8 (eight) hours as needed for anxiety.   Yes [provider]  ondansetron (ZOFRAN) 8 MG tablet Take 1 tablet (8 mg total) by mouth every 8 (eight) hours as needed for nausea or vomiting. Start on the third day after chemotherapy. 01/24/22  Yes [name], MD  prochlorperazine (COMPAZINE) 10 MG tablet Take 1 tablet (10 mg total) by mouth every 6 (six) hours as needed (Nausea or vomiting). 01/24/22  Yes [name], MD  dexamethasone (DECADRON) 4 MG tablet Take 2 tablets by mouth the night before and 2 tabs the morning of chemotherapy, every 3 weeks, for 6 cycles 01/24/22   [name], MD  lidocaine-prilocaine (EMLA) cream Apply to affected area once 01/24/22   [name], MD  mirtazapine (REMERON) 15 MG tablet Take 1 tablet (15 mg total) by mouth at bedtime. 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  Tobacco Use   Smoking status: Never   Smokeless tobacco: Never  Vaping Use   Vaping Use: Never used  Substance and Sexual Activity   Alcohol use: Never   Drug use: Never   Sexual activity: Not Currently  Other Topics Concern   Not on file  Social History Narrative   Not on file   Social Determinants of Health   Financial Resource Strain: Not on file  Food Insecurity: No Food Insecurity (04/26/2017)   Hunger Vital Sign    Worried About Running Out of Food in the Last Year: Never true    Ran Out of Food in the Last Year: Never true  Transportation Needs: Not on file  Physical Activity: Not on file  Stress: Not  on file  Social Connections: Not on file    Review of Systems: A 12 point ROS discussed and pertinent positives are indicated in the HPI above.  All other systems are negative.  Review of Systems  Vital Signs: BP (!) 149/79 (BP Location: Right Arm)   Pulse 92   Temp 98.4 F (36.9 C) (Oral)   Ht '5\' 3"'$  (1.6 m)   Wt 58.1 kg   SpO2 100%   BMI 22.67 kg/m   Physical Exam Constitutional:      Appearance: Normal appearance.  HENT:     Mouth/Throat:     Mouth: Mucous membranes are moist.     Pharynx: Oropharynx is clear.  Cardiovascular:     Rate and Rhythm: Normal rate and regular rhythm.     Heart sounds: Normal heart sounds.  Pulmonary:     Effort: Pulmonary effort is normal. No respiratory distress.     Breath sounds: Normal breath  sounds.  Abdominal:     General: Bowel sounds are normal. There is no distension.     Palpations: Abdomen is soft.     Tenderness: There is no abdominal tenderness.  Skin:    General: Skin is warm and dry.  Neurological:     General: No focal deficit present.     Mental Status: She is alert and oriented to person, place, and time.  Psychiatric:        Mood and Affect: Mood normal.        Thought Content: Thought content normal.        Judgment: Judgment normal.     Imaging: NM PET Image Initial (PI) Skull Base To Thigh (F-18 FDG)  Result Date: 01/12/2022 CLINICAL DATA:  Initial treatment strategy for uterine/cervical cancer. EXAM: NUCLEAR MEDICINE PET SKULL BASE TO THIGH TECHNIQUE: 6.3 mCi F-18 FDG was injected intravenously. Full-ring PET imaging was performed from the skull base to thigh after the radiotracer. CT data was obtained and used for attenuation correction and anatomic localization. Fasting blood glucose: 125 mg/dl COMPARISON:  MRI pelvis January 03, 2022 and CT abdomen pelvis December 29, 2021 FINDINGS: Mediastinal blood pool activity: SUV max 1.8 Liver activity: SUV max NA NECK: Symmetric hypermetabolic hyperplasia of the tonsils is commonly reactive. No hypermetabolic cervical adenopathy. Incidental CT findings: none CHEST: No hypermetabolic thoracic adenopathy. No hypermetabolic pulmonary nodules or masses. Incidental CT findings: none ABDOMEN/PELVIS: Hypermetabolic heterogeneous soft tissue mass centered on the cervix/lower uterine segment measures approximally 6.7 x 6.0 cm on image 167/4 with a max SUV of 21.6. Bilateral multiloculated cystic adnexal lesions demonstrate hypermetabolic activity within their soft tissue component with the left lesion measuring 10.2 x 8.2 cm on image 158/4 with a max SUV of 13.0 in the right lesion measuring 6.5 x 4.6 cm on image 151/4 with a max SUV of 10.9. No abnormal hypermetabolic activity within the liver, pancreas, adrenal glands, or spleen. No  hypermetabolic lymph nodes in the abdomen or pelvis. 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 SKELETON: No focal hypermetabolic activity to suggest skeletal metastasis. Incidental CT findings: none IMPRESSION: 1. Hypermetabolic heterogeneous mass centered on the cervix/lower uterine segment is most consistent with primary gynecologic neoplasm. 2. Multiloculated complex bilateral adnexal cystic lesions with hypermetabolic soft tissue component likely reflect metastatic lesions. 3. No hypermetabolic abdominopelvic adenopathy. 4. No evidence of hypermetabolic metastatic disease in the chest or abdomen. Electronically Signed   By: Dahlia Bailiff M.D.   On: 01/12/2022 15:08   MR Pelvis W Wo Contrast  Result Date: 01/03/2022 CLINICAL DATA:  Prolapsing mass identified on pelvic exam, uterine fibroids suspected, preoperative planning EXAM: MRI PELVIS WITHOUT AND WITH CONTRAST TECHNIQUE: Multiplanar multisequence MR imaging of the pelvis was performed both before and after administration of intravenous contrast. CONTRAST:  80m GADAVIST GADOBUTROL 1 MMOL/ML IV SOLN COMPARISON:  CT abdomen pelvis, 12/29/2021 pelvic ultrasound 06/27/2013 FINDINGS: Urinary Tract:  No abnormality visualized. Bowel:  Unremarkable visualized pelvic bowel loops. Vascular/Lymphatic: No pathologically enlarged lymph nodes. No significant vascular abnormality seen. Reproductive: Contrast enhancing multiloculated mixed solid and cystic lesions of the bilateral ovaries, measuring 8.4 x 7.0 x 5.4 cm on the right (series 21, image 22) and 10.3 x 8.7 x 8.1 cm on the left (series 21, image 38). In addition to these ovarian masses, there is a homogeneous enhancing mass arising from the right aspect of the uterine body and fundus measuring 6.0 x 5.5 x 4.3 cm (series 21, image 43), and an additional, much more heterogeneous mass which appears to arise from the cervix or posterior lower uterine segment measuring 8.1 x 6.4 x  6.2 cm (series 21, image 46). This appears to closely abut and compress or perhaps directly involve the adjacent rectum (series 31, image 42) Other:  None. Musculoskeletal: No suspicious bone lesions identified. IMPRESSION: 1. Large mixed solid and cystic lesions of the bilateral ovaries, measuring 8.4 x 7.0 x 5.4 cm on the right and 10.3 x 8.7 x 8.1 cm on the left, highly concerning for primary ovarian malignancy and contralateral ovarian metastasis. 2. Homogeneously enhancing mass arising from the right aspect of the uterine body and fundus measuring 6.0 x 5.5 x 4.3 cm, consistent with a uterine fibroid and similar to prior ultrasound dated 2015. 3. An additional, much more heterogeneous mass which appears to arise from the cervix or posterior lower uterine segment measuring 8.1 x 6.4 x 6.2 cm. This may reflect an additional fibroid with internal degeneration, however this was not clearly visualized on ultrasound dated 2015 and is highly suspicious for a cervical mass as significant enlargement of benign fibroids is not expected following menopause. This mass appears to closely abut and compress or perhaps even directly involve the adjacent rectum. 4.  No evidence of lymphadenopathy in the pelvis. These results will be called to the ordering clinician or representative by the Radiologist Assistant, and communication documented in the PACS or [employer] Electronically Signed   By: ADelanna AhmadiM.D.   On: 01/03/2022 09:13   CT ABDOMEN PELVIS W CONTRAST  Result Date: 12/29/2021 CLINICAL DATA:  Lower abdominal pain; * Tracking Code: BO * EXAM: CT ABDOMEN AND PELVIS WITH CONTRAST TECHNIQUE: Multidetector CT imaging of the abdomen and pelvis was performed using the standard protocol following bolus administration of intravenous contrast. RADIATION DOSE REDUCTION: This exam was performed according to the departmental dose-optimization program which includes automated exposure control, adjustment of the mA  and/or kV according to patient size and/or use of iterative reconstruction technique. CONTRAST:  1053mISOVUE-300 IOPAMIDOL (ISOVUE-300) INJECTION 61% COMPARISON:  Pelvic ultrasound dated June 27, 2013 FINDINGS: Lower chest: Ground-glass nodule of the right lower lobe measuring 6 mm on series 4 image 6. Hepatobiliary: No focal liver abnormality is seen. No gallstones, gallbladder wall thickening, or biliary dilatation. 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 Spleen: Normal in size without focal abnormality. Adrenals/Urinary Tract: Bilateral adrenal glands are unremarkable. Horseshoe kidney. No evidence of hydronephrosis or nephrolithiasis. Stomach/Bowel: Stomach is within normal limits. Appendix appears normal.  No evidence of bowel wall thickening, distention, or inflammatory changes. Vascular/Lymphatic: Aortic atherosclerosis. No enlarged abdominal or pelvic lymph nodes. Reproductive: Large heterogeneous mass of the posterior pelvis likely arising from the lower uterus measuring approximally 6.3 x 5.2 cm. Bilateral large multiloculated cystic ovarian lesions measuring approximally 10.4 x 7.5 cm on the left and 7.9 x 6.7 cm on the right. Large mass of the right uterine fundus measuring 5.5 x 4.4 cm on series 2, image 68, finding correlates with fibroid described on prior 2015 ultrasound. Other: Small volume pelvic free fluid. Musculoskeletal: No acute or significant osseous findings. IMPRESSION: 1. Bilateral large multiloculated cystic lesions of the ovaries, concerning for ovarian malignancy. Recommend gynecologic consultation and pelvic ultrasound and/or pelvic MRI with contrast for further evaluation. 2. Large heterogeneous mass of the posterior pelvis likely arising from the lower uterus, possibly a fibroid, although malignancy is also a concern. Recommend attention on pelvic ultrasound or MRI as above. 3. Additional mass arising from the  right uterine fundus which correlates with fibroid described on prior 2015 ultrasound. 4. Nonspecific small ground-glass nodule of the right lower lobe measuring 6 mm. Recommend follow-up chest CT in 6 months for further evaluation. 5. Aortic Atherosclerosis (ICD10-I70.0). Electronically Signed   By: Yetta Glassman M.D.   On: 12/29/2021 12:45    Labs:  CBC: Recent Labs    01/05/22 0808  WBC 8.2  HGB 13.6  HCT 41.5  PLT 304    COAGS: No results for input(s): "INR", "APTT" in the last 8760 hours.  BMP: Recent Labs    01/05/22 0808  NA 141  K 3.8  CL 111  CO2 23  GLUCOSE 119*  BUN 19  CALCIUM 9.1  CREATININE 0.66  GFRNONAA >60    LIVER FUNCTION TESTS: Recent Labs    01/05/22 0808  BILITOT 0.3  AST 24  ALT 19  ALKPHOS 47  PROT 6.9  ALBUMIN 3.9     Assessment and Plan: Uterine/gyn carcinoma For port placement today. Risks and benefits of image guided port-a-catheter placement was discussed with the patient including, but not limited to bleeding, infection, pneumothorax, or fibrin sheath development and need for additional procedures.  All of the patient's questions were answered, patient is agreeable to proceed. Consent signed and in chart.     Electronically Signed: Ascencion Dike, PA-C 01/25/2022, 10:30 AM   I spent a total of 20 in face to face in clinical consultation, greater than 50% of which was counseling/coordinating care for port

## 2022-01-25 NOTE — Procedures (Signed)
Interventional Radiology Procedure Note  Procedure: RT IJ POWER PORT    Complications: None  Estimated Blood Loss:  MIN  Findings: TIP SVCRA    M. TREVOR Taylin Leder, MD    

## 2022-01-25 NOTE — Assessment & Plan Note (Signed)
I have reviewed imaging study with the patient and explained to the patient and her son why upfront surgery is not indicated We discussed neoadjuvant chemotherapy approach  We reviewed the NCCN guidelines I recommend treatment based on publication as follows:  Dostarlimab for Primary Advanced or Recurrent Endometrial Cancer  M[place]oor Reola Mosher, M.D., Edwinna Areola. Cheri Rous, M.D., Virl Cagey. Slomovitz, M.D., Ren Loraine Maple, Ph.D., Suzie Portela, Ph.D., Para Skeans, M.D., Moises Blood, M.D., Annye Rusk, M.D., Verneda Skill, M.D., Vilinda Blanks. Carmin Muskrat, M.D., Ph.D., Aloha Gell. Romero Belling, M.D., Evlyn Clines, M.D., et al., for the [place] [id number] DOI: [id number]  BACKGROUND Dostarlimab is an immune-checkpoint inhibitor that targets the programmed cell death 1 receptor. The combination of chemotherapy and immunotherapy may have synergistic effects in the treatment of endometrial cancer.  METHODS We conducted a phase 3, global, double-blind, randomized, placebo-controlled trial. Eligible patients with primary advanced stage III or IV or first recurrent endometrial cancer were randomly assigned in a 1:1 ratio to receive either dostarlimab (500 mg) or placebo, plus carboplatin (area under the concentration-time curve, 5 mg per milliliter per minute) and paclitaxel (175 mg per square meter of body-surface area), every 3 weeks (six cycles), followed by dostarlimab (1000 mg) or placebo every 6 weeks for up to 3 years. The primary end points were progression-free survival as assessed by the investigator according to Response Evaluation Criteria in Solid Tumors (RECIST), version 1.1, and overall survival. Safety was also assessed.  RESULTS Of the 494 patients who underwent randomization, 118 (23.9%) had mismatch repair-deficient (dMMR), microsatellite instability-high (MSI-H) tumors. In the dMMR-MSI-H population, estimated progression-free survival at 24 months was  61.4% (95% confidence interval [CI], 46.3 to 73.4) in the dostarlimab group and 15.7% (95% CI, 7.2 to 27.0) in the placebo group (hazard ratio for progression or death, 0.28; 95% CI, 0.16 to 0.50; P<0.001). In the overall population, progression-free survival at 24 months was 36.1% (95% CI, 29.3 to 42.9) in the dostarlimab group and 18.1% (95% CI, 13.0 to 23.9) in the placebo group (hazard ratio, 0.64; 95% CI, 0.51 to 0.80; P<0.001). Overall survival at 24 months was 71.3% (95% CI, 64.5 to 77.1) with dostarlimab and 56.0% (95% CI, 48.9 to 62.5) with placebo (hazard ratio for death, 0.64; 95% CI, 0.46 to 0.87). The most common adverse events that occurred or worsened during treatment were nausea (53.9% of the patients in the dostarlimab group and 45.9% of those in the placebo group), alopecia (53.5% and 50.0%), and fatigue (51.9% and 54.5%). Severe and serious adverse events were more frequent in the dostarlimab group than in the placebo group.  CONCLUSIONS Dostarlimab plus carboplatin-paclitaxel significantly increased progression-free survival among patients with primary advanced or recurrent endometrial cancer, with a substantial benefit in the dMMR-MSI-H population. (Funded by [employer]; [employer].gov number, KXF81829937)  The risks, benefits, side effects of treatment is discussed with the patient and she agreed to proceed with plan of care.  I recommend chemo education class, port placement and blood work before we start her on treatment I plan to see her within the week or so after treatment started for toxicity review I recommend minimum 3 cycles of treatment before repeating CT imaging

## 2022-01-25 NOTE — Assessment & Plan Note (Addendum)
She is very anxious Zoloft make her feel worse She is not sleeping well I recommend a trial of Remeron We also discussed the importance of frequent small meals

## 2022-01-25 NOTE — Progress Notes (Signed)
[place] CONSULT NOTE  Patient Care Team: Lawerance Cruel, MD as PCP - General (Family Medicine)  ASSESSMENT & PLAN:  Uterine cancer [facility]) I have reviewed imaging study with the patient and explained to the patient and her son why upfront surgery is not indicated We discussed neoadjuvant chemotherapy approach  We reviewed the NCCN guidelines I recommend treatment based on publication as follows:  Dostarlimab for Primary Advanced or Recurrent Endometrial Cancer  [place] Reola Mosher, M.D., Edwinna Areola. Cheri Rous, M.D., Virl Cagey. Slomovitz, M.D., Ren Loraine Maple, Ph.D., Suzie Portela, Ph.D., Para Skeans, M.D., Moises Blood, M.D., Annye Rusk, M.D., Verneda Skill, M.D., Vilinda Blanks. Carmin Muskrat, M.D., Ph.D., Aloha Gell. Romero Belling, M.D., Evlyn Clines, M.D., et al., for the [place] [id number] DOI: [id number]  BACKGROUND Dostarlimab is an immune-checkpoint inhibitor that targets the programmed cell death 1 receptor. The combination of chemotherapy and immunotherapy may have synergistic effects in the treatment of endometrial cancer.  METHODS We conducted a phase 3, global, double-blind, randomized, placebo-controlled trial. Eligible patients with primary advanced stage III or IV or first recurrent endometrial cancer were randomly assigned in a 1:1 ratio to receive either dostarlimab (500 mg) or placebo, plus carboplatin (area under the concentration-time curve, 5 mg per milliliter per minute) and paclitaxel (175 mg per square meter of body-surface area), every 3 weeks (six cycles), followed by dostarlimab (1000 mg) or placebo every 6 weeks for up to 3 years. The primary end points were progression-free survival as assessed by the investigator according to Response Evaluation Criteria in Solid Tumors (RECIST), version 1.1, and overall survival. Safety was also assessed.  RESULTS Of the 494 patients who underwent randomization, 118 (23.9%)  had mismatch repair-deficient (dMMR), microsatellite instability-high (MSI-H) tumors. In the dMMR-MSI-H population, estimated progression-free survival at 24 months was 61.4% (95% confidence interval [CI], 46.3 to 73.4) in the dostarlimab group and 15.7% (95% CI, 7.2 to 27.0) in the placebo group (hazard ratio for progression or death, 0.28; 95% CI, 0.16 to 0.50; P<0.001). In the overall population, progression-free survival at 24 months was 36.1% (95% CI, 29.3 to 42.9) in the dostarlimab group and 18.1% (95% CI, 13.0 to 23.9) in the placebo group (hazard ratio, 0.64; 95% CI, 0.51 to 0.80; P<0.001). Overall survival at 24 months was 71.3% (95% CI, 64.5 to 77.1) with dostarlimab and 56.0% (95% CI, 48.9 to 62.5) with placebo (hazard ratio for death, 0.64; 95% CI, 0.46 to 0.87). The most common adverse events that occurred or worsened during treatment were nausea (53.9% of the patients in the dostarlimab group and 45.9% of those in the placebo group), alopecia (53.5% and 50.0%), and fatigue (51.9% and 54.5%). Severe and serious adverse events were more frequent in the dostarlimab group than in the placebo group.  CONCLUSIONS Dostarlimab plus carboplatin-paclitaxel significantly increased progression-free survival among patients with primary advanced or recurrent endometrial cancer, with a substantial benefit in the dMMR-MSI-H population. (Funded by [employer]; [employer].gov number, SWN46270350)  The risks, benefits, side effects of treatment is discussed with the patient and she agreed to proceed with plan of care.  I recommend chemo education class, port placement and blood work before we start her on treatment I plan to see her within the week or so after treatment started for toxicity review I recommend minimum 3 cycles of treatment before repeating CT imaging  Malignant cachexia [facility]) She is very anxious Zoloft make her feel worse She is not sleeping well I recommend a trial of Remeron  We also  discussed the importance of frequent small meals  Orders Placed This Encounter  Procedures   CBC with Differential ([place] Only)    Standing Status:   Standing    Number of Occurrences:   20    Standing Expiration Date:   01/25/2023   CMP ([place] only)    Standing Status:   Standing    Number of Occurrences:   20    Standing Expiration Date:   01/25/2023   T4    Standing Status:   Standing    Number of Occurrences:   20    Standing Expiration Date:   01/25/2023   TSH    Standing Status:   Standing    Number of Occurrences:   20    Standing Expiration Date:   01/25/2023    The total time spent in the appointment was 60 minutes encounter with patients including review of chart and various tests results, discussions about plan of care and coordination of care plan   All questions were answered. The patient knows to call the clinic with any problems, questions or concerns. No barriers to learning was detected.  [name], MD 8/9/20231:27 PM  CHIEF COMPLAINTS/PURPOSE OF CONSULTATION:  Urine cancer, for further evaluation  HISTORY OF PRESENTING ILLNESS:  Tiffany [name] 61 y.o. 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 Summary of oncologic history is as follows: Oncology History Overview Note  HER2 negative   Uterine cancer ([place])  12/03/2021 Initial Diagnosis   Patient reports several month history of intermittent pelvic pain that she describes as discomfort or sometimes cramping, that has worsened with time.  She notes that this is tolerable.  She began having postmenopausal bleeding  started on 6/17 with a few spots of blood.     12/29/2021 Imaging   1. Bilateral large multiloculated cystic lesions of the ovaries, concerning for ovarian malignancy. Recommend gynecologic consultation and pelvic ultrasound and/or pelvic MRI with contrast for further evaluation. 2. Large heterogeneous mass of the posterior pelvis likely arising from the lower uterus, possibly a fibroid, although malignancy is also a concern. Recommend attention on pelvic ultrasound or MRI as above. 3. Additional mass arising from the right uterine fundus which correlates with fibroid described on prior 2015 ultrasound. 4. Nonspecific small ground-glass nodule of the right lower lobe measuring 6 mm. Recommend follow-up chest CT in 6 months for further evaluation. 5. Aortic Atherosclerosis (ICD10-I70.0).   12/30/2021 Pathology Results   A. CERVIX, BIOPSY:  -  Scantly cellular specimen with predominantly blood, fibrin and acute inflammatory infiltrate but with scattered highly atypical cells highly suspicious for malignancy.   Note: A p16 shows strong positivity in these scattered cells; however, while p53 is mildly increased definitive strong overexpression or under expression is not identified.  These findings support the suspicion for malignancy; however, are insufficient for a definitive diagnosis.     01/03/2022 Imaging   MRI pelvis  1. Large mixed solid and cystic lesions of the bilateral ovaries, measuring 8.4 x 7.0 x 5.4 cm on the right and 10.3 x 8.7 x 8.1 cm on the left, highly concerning for primary ovarian  malignancy and contralateral ovarian metastasis.   2. Homogeneously enhancing mass arising from the right aspect of the uterine body and fundus measuring 6.0 x 5.5 x 4.3 cm, consistent with a uterine fibroid and similar to prior ultrasound dated 2015.   3. An additional, much more heterogeneous mass which appears to arise from the cervix or posterior lower uterine segment measuring 8.1 x 6.4 x 6.2 cm.  This may reflect an additional fibroid with internal degeneration, however this was not clearly visualized on ultrasound dated 2015 and is highly suspicious for a cervical mass as significant enlargement of benign fibroids is not expected following menopause. This mass appears to closely abut and compress or perhaps even directly involve the adjacent rectum.   4.  No evidence of lymphadenopathy in the pelvis.   01/12/2022 PET scan   1. Hypermetabolic heterogeneous mass centered on the cervix/lower uterine segment is most consistent with primary gynecologic neoplasm. 2. Multiloculated complex bilateral adnexal cystic lesions with hypermetabolic soft tissue component likely reflect metastatic lesions. 3. No hypermetabolic abdominopelvic adenopathy. 4. No evidence of hypermetabolic metastatic disease in the chest or abdomen.   01/18/2022 Pathology Results   FINAL MICROSCOPIC DIAGNOSIS:   A. CERVICAL MASS, BIOPSIES:  - Poorly differentiated carcinoma with necrosis, consistent with high-grade serous carcinoma, see comment   B. CERVICAL MASS, EXCISION:  - Poorly differentiated carcinoma with necrosis, consistent with high-grade serous carcinoma see comment  - Edges of resected tissue fragments are positive for carcinoma   COMMENT:   A and B.   The tumor cells are positive for PAX8, ER and p16 immunostains.  Immunostain for p53 shows significant overexpression. Immunostain for CK5/6 shows weak focal labeling while immunostain for  p63 is negative.  The immunoprofile is consistent with above interpretation.  The carcinoma is likely endometrial or ovarian origin with secondary involvement of the cervix.     01/23/2022 Initial Diagnosis   Uterine cancer ([place])   01/23/2022 Cancer Staging   Staging form: Corpus Uteri - Carcinoma and Carcinosarcoma, AJCC 8th Edition - Clinical stage from 01/23/2022: FIGO Stage IIIA (cT3a, cN0, cM0) - Signed by [name], MD on 01/23/2022 Stage prefix: Initial diagnosis    01/31/2022 -  Chemotherapy   Patient is on Treatment Plan : UTERINE ENDOMETRIAL Dostarlimab-gxly (500 mg) + Carboplatin (AUC 5) + Paclitaxel (175 mg/m2) q21d x 6 cycles / Dostarlimab-gxly (1000 mg) q42d x 6 cycles        MEDICAL HISTORY:  Past Medical History:  Diagnosis Date   Anxiety    Depression    GERD (gastroesophageal reflux disease)    Hypothyroidism    not on medications currently    SURGICAL HISTORY: Past Surgical History:  Procedure Laterality Date   CESAREAN SECTION     DILATION AND CURETTAGE OF UTERUS     61yo for SAB   IR IMAGING GUIDED PORT INSERTION  01/25/2022   LESION REMOVAL N/A 01/18/2022   Procedure: CERVICAL BIOPSIES;  Surgeon: [name], MD;  Location: [facility];  Service: Gynecology;  Laterality: N/A;    SOCIAL HISTORY: Social History   Socioeconomic History   Marital status: Divorced    Spouse name: Not on file   Number of children: Not on file   Years of education: Not on file   Highest education level: Not on file  Occupational History   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  Tobacco Use   Smoking status: Never   Smokeless tobacco: Never  Vaping Use   Vaping Use:  Never used  Substance and Sexual Activity   Alcohol use: Never   Drug use: Never   Sexual activity: Not Currently  Other Topics Concern   Not on file  Social History Narrative   Not on file   Social Determinants of Health   Financial Resource Strain: Not on file  Food Insecurity: No Food Insecurity (04/26/2017)   Hunger Vital Sign    Worried About Running Out of Food in the Last Year: Never true    Ran Out of Food in the Last Year: Never true  Transportation Needs: Not on file  Physical Activity: Not on file  Stress: Not on file  Social Connections: Not on file  Intimate Partner Violence: Not on file    FAMILY HISTORY: Family History  Problem Relation Age of Onset   Thyroid disease Mother    Breast cancer Mother    Cancer Other    Colon cancer Neg Hx     Ovarian cancer Neg Hx    Endometrial cancer Neg Hx    Pancreatic cancer Neg Hx    Prostate cancer Neg Hx     ALLERGIES:  is allergic to seasonal ic [octacosanol] and latex.  MEDICATIONS:  Current Outpatient Medications  Medication Sig Dispense Refill   dexamethasone (DECADRON) 4 MG tablet Take 2 tablets by mouth the night before and 2 tabs the morning of chemotherapy, every 3 weeks, for 6 cycles 36 tablet 6   mirtazapine (REMERON) 15 MG tablet Take 1 tablet (15 mg total) by mouth at bedtime. 30 tablet 3   ALPRAZolam (XANAX) 0.25 MG tablet Take 0.25 mg by mouth every 8 (eight) hours as needed for anxiety.     lidocaine-prilocaine (EMLA) cream Apply to affected area once 30 g 3   ondansetron (ZOFRAN) 8 MG tablet Take 1 tablet (8 mg total) by mouth every 8 (eight) hours as needed for nausea or vomiting. Start on the third day after chemotherapy. 30 tablet 1   prochlorperazine (COMPAZINE) 10 MG tablet Take 1 tablet (10 mg total) by mouth every 6 (six) hours as needed (Nausea or vomiting). 30 tablet 1   No current facility-administered medications for this visit.   Facility-Administered Medications Ordered in Other Visits  Medication Dose Route Frequency Provider Last Rate Last Admin   0.9 %  sodium chloride infusion   Intravenous Continuous Monia Sabal, PA-C   Stopped at 01/25/22 1210   fentaNYL (SUBLIMAZE) 100 MCG/2ML injection            heparin lock flush 100 UNIT/ML injection            lidocaine-EPINEPHrine (XYLOCAINE W/EPI) 1 %-1:100000 (with pres) injection            midazolam (VERSED) 2 MG/2ML injection             REVIEW OF SYSTEMS:   Constitutional: Denies fevers, chills or abnormal night sweats Eyes: Denies blurriness of vision, double vision or watery eyes Ears, nose, mouth, throat, [name], [name], chest discomfort or lower extremity swelling Gastrointestinal:   Denies nausea, heartburn or change in bowel habits Skin: Denies abnormal skin rashes Lymphatics: Denies new lymphadenopathy or easy bruising Neurological:Denies numbness, tingling or new weaknesses Behavioral/Psych: Mood is stable, no new changes  All other systems were reviewed with the patient and are negative.  PHYSICAL EXAMINATION: ECOG PERFORMANCE STATUS: 1 - Symptomatic but completely ambulatory  Vitals:   01/24/22 1243  BP: Marland Kitchen)  145/77  Pulse: (!) 108  Resp: 18  Temp: 99.5 F (37.5 C)  SpO2: 100%   Filed Weights   01/24/22 1243  Weight: 126 lb 9.6 oz (57.4 kg)    GENERAL:alert, no distress and comfortable SKIN: skin color, texture, turgor are normal, no rashes or significant lesions EYES: normal, conjunctiva are pink and non-injected, sclera clear OROPHARYNX:no exudate, no erythema and lips, buccal mucosa, and tongue normal  NECK: supple, thyroid normal size, non-tender, without nodularity LYMPH:  no palpable lymphadenopathy in the cervical, axillary or inguinal LUNGS: clear to auscultation and percussion with normal breathing effort HEART: regular rate & rhythm and no murmurs and no lower extremity edema ABDOMEN:abdomen soft, noted soft tissue fullness in the suprapubic region Musculoskeletal:no cyanosis of digits and no clubbing  PSYCH: alert & oriented x 3 with fluent speech NEURO: no focal motor/sensory deficits  LABORATORY DATA:  I have reviewed the data as listed Lab Results  Component Value Date   WBC 8.2 01/05/2022   HGB 13.6 01/05/2022   HCT 41.5 01/05/2022   MCV 76.4 (L) 01/05/2022   PLT 304 01/05/2022   Recent Labs    01/05/22 0808  NA 141  K 3.8  CL 111  CO2 23  GLUCOSE 119*  BUN 19  CREATININE 0.66  CALCIUM 9.1  GFRNONAA >60  PROT 6.9  ALBUMIN 3.9  AST 24  ALT 19  ALKPHOS 47  BILITOT 0.3    RADIOGRAPHIC STUDIES: I have personally reviewed the radiological images as listed and agreed with the findings in the report. IR IMAGING  GUIDED PORT INSERTION  Result Date: 01/25/2022 CLINICAL DATA:  Uterine cancer, access for chemotherapy EXAM: RIGHT INTERNAL JUGULAR SINGLE LUMEN POWER PORT CATHETER INSERTION Date:  01/25/2022 01/25/2022 12:15 pm Radiologist:  Jerilynn Mages. Daryll Brod, MD Guidance:  Ultrasound and fluoroscopic MEDICATIONS: 1% lidocaine local with epinephrine ANESTHESIA/SEDATION: Versed 2.0 mg IV; Fentanyl 100 mcg IV; Moderate Sedation Time:  25 minute The patient was continuously monitored during the procedure by the interventional radiology nurse under my direct supervision. FLUOROSCOPY: 0 minutes, 36 seconds (1.0 mGy) COMPLICATIONS: None immediate. CONTRAST:  None. PROCEDURE: Informed consent was obtained from the patient following explanation of the procedure, risks, benefits and alternatives. The patient understands, agrees and consents for the procedure. All questions were addressed. A time out was performed. Maximal barrier sterile technique utilized including caps, mask, sterile gowns, sterile gloves, large sterile drape, hand hygiene, and 2% chlorhexidine scrub. Under sterile conditions and local anesthesia, right internal jugular micropuncture venous access was performed. Access was performed with ultrasound. Images were obtained for documentation of the patent right internal jugular vein. A guide wire was inserted followed by a transitional dilator. This allowed insertion of a guide wire and catheter into the IVC. Measurements were obtained from the SVC / RA junction back to the right IJ venotomy site. In the right infraclavicular chest, a subcutaneous pocket was created over the second anterior rib. This was done under sterile conditions and local anesthesia. 1% lidocaine with epinephrine was utilized for this. A 2.5 cm incision was made in the skin. Blunt dissection was performed to create a subcutaneous pocket over the right pectoralis major muscle. The pocket was flushed with saline vigorously. There was adequate hemostasis. The  port catheter was assembled and checked for leakage. The port catheter was secured in the pocket with two retention sutures. The tubing was tunneled subcutaneously to the right venotomy site and inserted into the SVC/RA junction through a valved peel-away sheath. Position was  confirmed with fluoroscopy. Images were obtained for documentation. The patient tolerated the procedure well. No immediate complications. Incisions were closed in a two layer fashion with 4 - 0 Vicryl suture. Dermabond was applied to the skin. The port catheter was accessed, blood was aspirated followed by saline and heparin flushes. Needle was removed. A dry sterile dressing was applied. IMPRESSION: Ultrasound and fluoroscopically guided right internal jugular single lumen power port catheter insertion. Tip in the SVC/RA junction. Catheter ready for use. Electronically Signed   By: Jerilynn Mages.  Shick M.D.   On: 01/25/2022 12:36   NM PET Image Initial (PI) Skull Base To Thigh (F-18 FDG)  Result Date: 01/12/2022 CLINICAL DATA:  Initial treatment strategy for uterine/cervical cancer. EXAM: NUCLEAR MEDICINE PET SKULL BASE TO THIGH TECHNIQUE: 6.3 mCi F-18 FDG was injected intravenously. Full-ring PET imaging was performed from the skull base to thigh after the radiotracer. CT data was obtained and used for attenuation correction and anatomic localization. Fasting blood glucose: 125 mg/dl COMPARISON:  MRI pelvis January 03, 2022 and CT abdomen pelvis December 29, 2021 FINDINGS: Mediastinal blood pool activity: SUV max 1.8 Liver activity: SUV max NA NECK: Symmetric hypermetabolic hyperplasia of the tonsils is commonly reactive. No hypermetabolic cervical adenopathy. Incidental CT findings: none CHEST: No hypermetabolic thoracic adenopathy. No hypermetabolic pulmonary nodules or masses. Incidental CT findings: none ABDOMEN/PELVIS: Hypermetabolic heterogeneous soft tissue mass centered on the cervix/lower uterine segment measures approximally 6.7 x 6.0 cm on image  167/4 with a max SUV of 21.6. Bilateral multiloculated cystic adnexal lesions demonstrate hypermetabolic activity within their soft tissue component with the left lesion measuring 10.2 x 8.2 cm on image 158/4 with a max SUV of 13.0 in the right lesion measuring 6.5 x 4.6 cm on image 151/4 with a max SUV of 10.9. No abnormal hypermetabolic activity within the liver, pancreas, adrenal glands, or spleen. No hypermetabolic lymph nodes in the abdomen or pelvis. 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 SKELETON: No focal hypermetabolic activity to suggest skeletal metastasis. Incidental CT findings: none IMPRESSION: 1. Hypermetabolic heterogeneous mass centered on the cervix/lower uterine segment is most consistent with primary gynecologic neoplasm. 2. Multiloculated complex bilateral adnexal cystic lesions with hypermetabolic soft tissue component likely reflect metastatic lesions. 3. No hypermetabolic abdominopelvic adenopathy. 4. No evidence of hypermetabolic metastatic disease in the chest or abdomen. Electronically Signed   By: Dahlia Bailiff M.D.   On: 01/12/2022 15:08   MR Pelvis W Wo Contrast  Result Date: 01/03/2022 CLINICAL DATA:  Prolapsing mass identified on pelvic exam, uterine fibroids suspected, preoperative planning EXAM: MRI PELVIS WITHOUT AND WITH CONTRAST TECHNIQUE: Multiplanar multisequence MR imaging of the pelvis was performed both before and after administration of intravenous contrast. CONTRAST:  4m GADAVIST GADOBUTROL 1 MMOL/ML IV SOLN COMPARISON:  CT abdomen pelvis, 12/29/2021 pelvic ultrasound 06/27/2013 FINDINGS: Urinary Tract:  No abnormality visualized. Bowel:  Unremarkable visualized pelvic bowel loops. Vascular/Lymphatic: No pathologically enlarged lymph nodes. No significant vascular abnormality seen. Reproductive: Contrast enhancing multiloculated mixed solid and cystic lesions of the bilateral ovaries, measuring 8.4 x 7.0 x 5.4 cm on the right  (series 21, image 22) and 10.3 x 8.7 x 8.1 cm on the left (series 21, image 38). In addition to these ovarian masses, there is a homogeneous enhancing mass arising from the right aspect of the uterine body and fundus measuring 6.0 x 5.5 x 4.3 cm (series 21, image 43), and an additional, much more heterogeneous mass which appears to arise from the cervix or posterior lower  uterine segment measuring 8.1 x 6.4 x 6.2 cm (series 21, image 46). This appears to closely abut and compress or perhaps directly involve the adjacent rectum (series 31, image 42) Other:  None. Musculoskeletal: No suspicious bone lesions identified. IMPRESSION: 1. Large mixed solid and cystic lesions of the bilateral ovaries, measuring 8.4 x 7.0 x 5.4 cm on the right and 10.3 x 8.7 x 8.1 cm on the left, highly concerning for primary ovarian malignancy and contralateral ovarian metastasis. 2. Homogeneously enhancing mass arising from the right aspect of the uterine body and fundus measuring 6.0 x 5.5 x 4.3 cm, consistent with a uterine fibroid and similar to prior ultrasound dated 2015. 3. An additional, much more heterogeneous mass which appears to arise from the cervix or posterior lower uterine segment measuring 8.1 x 6.4 x 6.2 cm. This may reflect an additional fibroid with internal degeneration, however this was not clearly visualized on ultrasound dated 2015 and is highly suspicious for a cervical mass as significant enlargement of benign fibroids is not expected following menopause. This mass appears to closely abut and compress or perhaps even directly involve the adjacent rectum. 4.  No evidence of lymphadenopathy in the pelvis. These results will be called to the ordering clinician or representative by the Radiologist Assistant, and communication documented in the PACS or [employer]. Electronically Signed   By: Delanna Ahmadi M.D.   On: 01/03/2022 09:13   CT ABDOMEN PELVIS W CONTRAST  Result Date: 12/29/2021 CLINICAL DATA:  Lower  abdominal pain; * Tracking Code: BO * EXAM: CT ABDOMEN AND PELVIS WITH CONTRAST TECHNIQUE: Multidetector CT imaging of the abdomen and pelvis was performed using the standard protocol following bolus administration of intravenous contrast. RADIATION DOSE REDUCTION: This exam was performed according to the departmental dose-optimization program which includes automated exposure control, adjustment of the mA and/or kV according to patient size and/or use of iterative reconstruction technique. CONTRAST:  146m ISOVUE-300 IOPAMIDOL (ISOVUE-300) INJECTION 61% COMPARISON:  Pelvic ultrasound dated June 27, 2013 FINDINGS: Lower chest: Ground-glass nodule of the right lower lobe measuring 6 mm on series 4 image 6. Hepatobiliary: No focal liver abnormality is seen. No gallstones, gallbladder wall thickening, or biliary dilatation. 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 Spleen: Normal in size without focal abnormality. Adrenals/Urinary Tract: Bilateral adrenal glands are unremarkable. Horseshoe kidney. No evidence of hydronephrosis or nephrolithiasis. Stomach/Bowel: Stomach is within normal limits. Appendix appears normal. No evidence of bowel wall thickening, distention, or inflammatory changes. Vascular/Lymphatic: Aortic atherosclerosis. No enlarged abdominal or pelvic lymph nodes. Reproductive: Large heterogeneous mass of the posterior pelvis likely arising from the lower uterus measuring approximally 6.3 x 5.2 cm. Bilateral large multiloculated cystic ovarian lesions measuring approximally 10.4 x 7.5 cm on the left and 7.9 x 6.7 cm on the right. Large mass of the right uterine fundus measuring 5.5 x 4.4 cm on series 2, image 68, finding correlates with fibroid described on prior 2015 ultrasound. Other: Small volume pelvic free fluid. Musculoskeletal: No acute or significant osseous findings. IMPRESSION: 1. Bilateral large multiloculated cystic  lesions of the ovaries, concerning for ovarian malignancy. Recommend gynecologic consultation and pelvic ultrasound and/or pelvic MRI with contrast for further evaluation. 2. Large heterogeneous mass of the posterior pelvis likely arising from the lower uterus, possibly a fibroid, although malignancy is also a concern. Recommend attention on pelvic ultrasound or MRI as above. 3. Additional mass arising from the right uterine fundus which correlates with fibroid described on  prior 2015 ultrasound. 4. Nonspecific small ground-glass nodule of the right lower lobe measuring 6 mm. Recommend follow-up chest CT in 6 months for further evaluation. 5. Aortic Atherosclerosis (ICD10-I70.0). Electronically Signed   By: Yetta Glassman M.D.   On: 12/29/2021 12:45

## 2022-01-26 ENCOUNTER — Other Ambulatory Visit (HOSPITAL_COMMUNITY): Payer: Self-pay

## 2022-01-26 ENCOUNTER — Telehealth: Payer: Self-pay

## 2022-01-26 NOTE — Telephone Encounter (Signed)
Returned her call. She is complaining of constipation. Last bm Tuesday. Instructed to take as needed for constipation, Miralax BID and Senokot 2 tabs up to 3 x day. Both can be purchased OTC, she ask that I send a mychart message with the names of the medications. Mychart message sent also.

## 2022-01-27 ENCOUNTER — Telehealth: Payer: Self-pay | Admitting: *Deleted

## 2022-01-27 NOTE — Telephone Encounter (Signed)
Trial:  [employer] 2021-05 - Specimen Collection Study to Evaluate Biomarkers in Subjects with Cancer  AND S2205, ICE COMPRESS: RANDOMIZED TRIAL OF LIMB CRYOCOMPRESSION VERSUS CONTINUOUS COMPRESSION VERSUS LOW CYCLIC COMPRESSION FOR THE PREVENTION OF TAXANE-INDUCED PERIPHERAL NEUROPATHY  Patient Tiffany [name] was identified by Dr. Alvy Bimler as a potential candidate for the above listed studies.  This Clinical Research Nurse called  Neha Waight, [phone], to discuss participation in the above listed research studies  A copy of the informed consent documents were e-mailed to the patient.  Patient reads, speaks, and understands [place].   Ten minutes was spent on the phone discussing both studies. Informed patient that participation is voluntary. Patient is scheduled to start chemotherapy on Tuesday 01/31/22.  Informed patient if she is interested in one or both of the studies then she would need to meet with research nurse prior to her appointments for Lab and Chemo Education on Monday to review and sign consent(s).   Encouraged patient to email or call research nurse with any questions prior to [facility] appointments and to let research nurse know if she is interested in meeting on Monday prior to her other appointments.  Patient verbalized understanding. Thanked patient for her time.  Foye Spurling, BSN, RN, [place] Nurse II 01/27/2022

## 2022-01-28 ENCOUNTER — Other Ambulatory Visit: Payer: Self-pay

## 2022-01-30 ENCOUNTER — Encounter: Payer: Self-pay | Admitting: Hematology and Oncology

## 2022-01-30 ENCOUNTER — Inpatient Hospital Stay: Payer: No Typology Code available for payment source

## 2022-01-30 ENCOUNTER — Telehealth: Payer: Self-pay | Admitting: *Deleted

## 2022-01-30 ENCOUNTER — Other Ambulatory Visit: Payer: Self-pay

## 2022-01-30 ENCOUNTER — Telehealth: Payer: Self-pay | Admitting: Gynecologic Oncology

## 2022-01-30 DIAGNOSIS — C539 Malignant neoplasm of cervix uteri, unspecified: Secondary | ICD-10-CM

## 2022-01-30 DIAGNOSIS — Z5112 Encounter for antineoplastic immunotherapy: Secondary | ICD-10-CM | POA: Diagnosis not present

## 2022-01-30 LAB — CMP (CANCER CENTER ONLY)
ALT: 11 U/L (ref 0–44)
AST: 13 U/L — ABNORMAL LOW (ref 15–41)
Albumin: 3.6 g/dL (ref 3.5–5.0)
Alkaline Phosphatase: 51 U/L (ref 38–126)
Anion gap: 3 — ABNORMAL LOW (ref 5–15)
BUN: 20 mg/dL (ref 6–20)
CO2: 25 mmol/L (ref 22–32)
Calcium: 8.3 mg/dL — ABNORMAL LOW (ref 8.9–10.3)
Chloride: 108 mmol/L (ref 98–111)
Creatinine: 0.67 mg/dL (ref 0.44–1.00)
GFR, Estimated: >60 mL/min (ref >60)
Glucose, Bld: 111 mg/dL — ABNORMAL HIGH (ref 70–99)
Potassium: 3.8 mmol/L (ref 3.5–5.1)
Sodium: 136 mmol/L (ref 135–145)
Total Bilirubin: 0.2 mg/dL — ABNORMAL LOW (ref 0.3–1.2)
Total Protein: 6 g/dL — ABNORMAL LOW (ref 6.5–8.1)

## 2022-01-30 LAB — CBC WITH DIFFERENTIAL (CANCER CENTER ONLY)
Abs Immature Granulocytes: 0.06 10*3/uL (ref 0.00–0.07)
Basophils Absolute: 0.1 10*3/uL (ref 0.0–0.1)
Basophils Relative: 0 %
Eosinophils Absolute: 0 10*3/uL (ref 0.0–0.5)
Eosinophils Relative: 0 %
HCT: 34.6 % — ABNORMAL LOW (ref 36.0–46.0)
Hemoglobin: 11.4 g/dL — ABNORMAL LOW (ref 12.0–15.0)
Immature Granulocytes: 1 %
Lymphocytes Relative: 24 %
Lymphs Abs: 2.7 10*3/uL (ref 0.7–4.0)
MCH: 24.8 pg — ABNORMAL LOW (ref 26.0–34.0)
MCHC: 32.9 g/dL (ref 30.0–36.0)
MCV: 75.2 fL — ABNORMAL LOW (ref 80.0–100.0)
Monocytes Absolute: 0.8 10*3/uL (ref 0.1–1.0)
Monocytes Relative: 7 %
Neutro Abs: 7.6 10*3/uL (ref 1.7–7.7)
Neutrophils Relative %: 68 %
Platelet Count: 306 10*3/uL (ref 150–400)
RBC: 4.6 MIL/uL (ref 3.87–5.11)
RDW: 14 % (ref 11.5–15.5)
WBC Count: 11.2 10*3/uL — ABNORMAL HIGH (ref 4.0–10.5)
nRBC: 0 % (ref 0.0–0.2)

## 2022-01-30 LAB — TSH: TSH: 0.653 u[IU]/mL (ref 0.350–4.500)

## 2022-01-30 MED FILL — Fosaprepitant Dimeglumine For IV Infusion 150 MG (Base Eq): INTRAVENOUS | Qty: 5 | Status: AC

## 2022-01-30 MED FILL — Dexamethasone Sodium Phosphate Inj 100 MG/10ML: INTRAMUSCULAR | Qty: 1 | Status: AC

## 2022-01-30 NOTE — Telephone Encounter (Signed)
Called the patient to check-in.  No answer.  Left voicemail requesting callback.  Jeral Pinch MD Gynecologic Oncology

## 2022-01-30 NOTE — Progress Notes (Signed)
The pharmacy team has substituted IV diphenhydramine for IV cetirizine as a premedication. Patient will be monitored for hypersensitivity reaction and adverse reactions to IV cetirizine. Thanks.   Kennith Center, Pharm.D., CPP 01/30/2022'@8'$ :21 AM

## 2022-01-30 NOTE — Progress Notes (Signed)
[place] arriving after noon on 8/15.  Dr. Alvy Bimler aware and ok'd for Paclitaxel & Carbo to infuse first to allow drug arrival.  Kennith Center, [place].D., CPP 01/30/2022'@1'$ :03 PM

## 2022-01-30 NOTE — Progress Notes (Signed)
Called pt to introduce myself as her Arboriculturist, discuss copay assistance and the [employer].  Pt would like to wait until she receives a bill before applying for copay assistance for Jemperli.  I also informed her of the [employer] and went over what it covers.  Pt would like to apply so she will bring her proof of income on 02/07/22.  If approved I will give her an expense sheet and my card for any questions or concerns she may have in the future.

## 2022-01-30 NOTE — Telephone Encounter (Signed)
[id number] and S2205: LVM to follow up on the studies to see if patient has any questions and if she is interested in participating in either or both studies. Reminded patient that participation is voluntary but if she does want to participate she would need to come in an hour early before her other appointments today. Requested call back if patient has any questions or wants to come in early to discuss further.  Foye Spurling, BSN, RN, [place] Clinical Research Nurse II 01/30/2022 10:46 AM

## 2022-01-31 ENCOUNTER — Inpatient Hospital Stay: Payer: No Typology Code available for payment source

## 2022-01-31 ENCOUNTER — Inpatient Hospital Stay: Payer: No Typology Code available for payment source | Admitting: Licensed Clinical Social Worker

## 2022-01-31 VITALS — BP 129/75 | HR 95 | Temp 97.8°F | Resp 18 | Wt 128.5 lb

## 2022-01-31 DIAGNOSIS — Z5112 Encounter for antineoplastic immunotherapy: Secondary | ICD-10-CM | POA: Diagnosis not present

## 2022-01-31 DIAGNOSIS — C539 Malignant neoplasm of cervix uteri, unspecified: Secondary | ICD-10-CM

## 2022-01-31 LAB — T4: T4, Total: 7.4 ug/dL (ref 4.5–12.0)

## 2022-01-31 MED ORDER — SODIUM CHLORIDE 0.9% FLUSH
10.0000 mL | INTRAVENOUS | Status: DC | PRN
  Administered 2022-01-31: 10 mL

## 2022-01-31 MED ORDER — PALONOSETRON HCL INJECTION 0.25 MG/5ML
0.2500 mg | Freq: Once | INTRAVENOUS | Status: AC
  Administered 2022-01-31: 0.25 mg via INTRAVENOUS

## 2022-01-31 MED ORDER — HEPARIN SOD (PORK) LOCK FLUSH 100 UNIT/ML IV SOLN
500.0000 [IU] | Freq: Once | INTRAVENOUS | Status: AC | PRN
  Administered 2022-01-31: 500 [IU]

## 2022-01-31 MED ORDER — FAMOTIDINE IN NACL 20-0.9 MG/50ML-% IV SOLN
20.0000 mg | Freq: Once | INTRAVENOUS | Status: AC
  Administered 2022-01-31: 20 mg via INTRAVENOUS

## 2022-01-31 MED ORDER — SODIUM CHLORIDE 0.9 % IV SOLN
175.0000 mg/m2 | Freq: Once | INTRAVENOUS | Status: AC
  Administered 2022-01-31: 282 mg via INTRAVENOUS
  Filled 2022-01-31: qty 47

## 2022-01-31 MED ORDER — CETIRIZINE HCL 10 MG/ML IV SOLN
10.0000 mg | Freq: Once | INTRAVENOUS | Status: AC
  Administered 2022-01-31: 10 mg via INTRAVENOUS
  Filled 2022-01-31: qty 1

## 2022-01-31 MED ORDER — SODIUM CHLORIDE 0.9 % IV SOLN
10.0000 mg | Freq: Once | INTRAVENOUS | Status: AC
  Administered 2022-01-31: 10 mg via INTRAVENOUS
  Filled 2022-01-31: qty 10

## 2022-01-31 MED ORDER — SODIUM CHLORIDE 0.9 % IV SOLN
470.0000 mg | Freq: Once | INTRAVENOUS | Status: AC
  Administered 2022-01-31: 470 mg via INTRAVENOUS
  Filled 2022-01-31: qty 47

## 2022-01-31 MED ORDER — SODIUM CHLORIDE 0.9 % IV SOLN: Freq: Once | INTRAVENOUS | Status: AC

## 2022-01-31 MED ORDER — SODIUM CHLORIDE 0.9 % IV SOLN
500.0000 mg | Freq: Once | INTRAVENOUS | Status: AC
  Administered 2022-01-31: 500 mg via INTRAVENOUS
  Filled 2022-01-31: qty 10

## 2022-01-31 MED ORDER — SODIUM CHLORIDE 0.9 % IV SOLN
150.0000 mg | Freq: Once | INTRAVENOUS | Status: AC
  Administered 2022-01-31: 150 mg via INTRAVENOUS
  Filled 2022-01-31: qty 150

## 2022-01-31 NOTE — Patient Instructions (Signed)
[facility]COL[facility]  Discharge Instructions: Thank you for choosing [facility] to provide your oncology and hematology care.   If you have a lab appointment with the [facility], please go directly to the [facility] and check in at the registration area.   Wear comfortable clothing and clothing appropriate for easy access to any Portacath or PICC line.   We strive to give you quality time with your provider. You may need to reschedule your appointment if you arrive late (15 or more minutes).  Arriving late affects you and other patients whose appointments are after yours.  Also, if you miss three or more appointments without notifying the office, you may be dismissed from the clinic at the provider's discretion.      For prescription refill requests, have your pharmacy contact our office and allow 72 hours for refills to be completed.    Today you received the following chemotherapy and/or immunotherapy agents: Paclitaxel, Carboplatin, Jemperli      To help prevent nausea and vomiting after your treatment, we encourage you to take your nausea medication as directed.  BELOW ARE SYMPTOMS THAT SHOULD BE REPORTED IMMEDIATELY: *FEVER GREATER THAN 100.4 F (38 C) OR HIGHER *CHILLS OR SWEATING *NAUSEA AND VOMITING THAT IS NOT CONTROLLED WITH YOUR NAUSEA MEDICATION *UNUSUAL SHORTNESS OF BREATH *UNUSUAL BRUISING OR BLEEDING *URINARY PROBLEMS (pain or burning when urinating, or frequent urination) *BOWEL PROBLEMS (unusual diarrhea, constipation, pain near the anus) TENDERNESS IN MOUTH AND THROAT WITH OR WITHOUT PRESENCE OF ULCERS (sore throat, sores in mouth, or a toothache) UNUSUAL RASH, SWELLING OR PAIN  UNUSUAL VAGINAL DISCHARGE OR ITCHING   Items with * indicate a potential emergency and should be followed up as soon as possible or go to the Emergency Department if any problems should occur.  Please show the CHEMOTHERAPY ALERT CARD or IMMUNOTHERAPY  ALERT CARD at check-in to the Emergency Department and triage nurse.  Should you have questions after your visit or need to cancel or reschedule your appointment, please contact [facility]  Dept: [phone]  and follow the prompts.  Office hours are 8:00 a.m. to 4:30 p.m. Monday - Friday. Please note that voicemails left after 4:00 p.m. may not be returned until the following business day.  We are closed weekends and major holidays. You have access to a nurse at all times for urgent questions. Please call the main number to the clinic Dept: [phone] and follow the prompts.   For any non-urgent questions, you may also contact your provider using MyChart. We now offer e-Visits for anyone 69 and older to request care online for non-urgent symptoms. For details visit [email or web address].   Also download the MyChart app! Go to the app store, search "MyChart", open the app, select [facility], and log in with your MyChart username and password.  Masks are optional in the cancer centers. If you would like for your care team to wear a mask while they are taking care of you, please let them know. You may have one support person who is at least 60 years old accompany you for your appointments.  Dostarlimab Injection What is this medication? DOSTARLIMAB (dos tar li mab) treats some types of cancer. It works by helping your immune system slow or stop the spread of cancer cells. It is a monoclonal antibody. This medicine may be used for other purposes; ask your health care provider or pharmacist if you have questions. COMMON BRAND NAME(S): Jemperli  What should I tell my care team before I take this medication? They need to know if you have any of these conditions: Allogeneic stem cell transplant (uses someone else's stem cells) Autoimmune diseases, such as Crohn disease, ulcerative colitis, lupus History of chest radiation Nervous system problems, such as  Guillain-Barre syndrome, myasthenia gravis Organ transplant An unusual or allergic reaction to dostarlimab, other medications, foods, dyes, or preservatives Pregnant or trying to get pregnant Breast-feeding How should I use this medication? This medication is injected into a vein. It is given by your care team in a hospital or clinic setting. A special MedGuide will be given to you before each treatment. Be sure to read this information carefully each time. Talk to your care team about the use of this medication in children. Special care may be needed. Overdosage: If you think you have taken too much of this medicine contact a poison control center or emergency room at once. NOTE: This medicine is only for you. Do not share this medicine with others. What if I miss a dose? Keep appointments for follow-up doses. It is important not to miss your dose. Call your care team if you are unable to keep an appointment. What may interact with this medication? Interactions have not been studied. This list may not describe all possible interactions. Give your health care provider a list of all the medicines, herbs, non-prescription drugs, or dietary supplements you use. Also tell them if you smoke, drink alcohol, or use illegal drugs. Some items may interact with your medicine. What should I watch for while using this medication? Your condition will be monitored carefully while you are receiving this medication. You may need blood work while taking this medication. This medication may cause serious skin reactions. They can happen weeks to months after starting the medication. Contact your care team right away if you notice fevers or flu-like symptoms with a rash. The rash may be red or purple and then turn into blisters or peeling of the skin. You may also notice a red rash with swelling of the face, lips, or lymph nodes in your neck or under your arms. Tell your care team right away if you have any change in  your eyesight. Talk to your care team if you may be pregnant. Serious birth defects can occur if you take this medication during pregnancy and for 4 months after the last dose. You will need a negative pregnancy test before starting this medication. Contraception is recommended while taking this medication and for 4 months after the last dose. Your care team can help you find the option that works for you. Do not breastfeed while taking this medication and for 4 months after the last dose. What side effects may I notice from receiving this medication? Side effects that you should report to your care team as soon as possible: Allergic reactions--skin rash, itching, hives, swelling of the face, lips, tongue, or throat Dry cough, shortness of breath or trouble breathing Eye pain, redness, irritation, or discharge with blurry or decreased vision Heart muscle inflammation--unusual weakness or fatigue, shortness of breath, chest pain, fast or irregular heartbeat, dizziness, swelling of the ankles, feet, or hands Hormone gland problems--headache, sensitivity to light, unusual weakness or fatigue, dizziness, fast or irregular heartbeat, increased sensitivity to cold or heat, excessive sweating, constipation, hair loss, increased thirst or amount of urine, tremors or shaking, irritability Infusion reactions--chest pain, shortness of breath or trouble breathing, feeling faint or lightheaded Kidney injury (glomerulonephritis)--decrease in the  amount of urine, red or dark brown urine, foamy or bubbly urine, swelling of the ankles, hands, or feet Liver injury--right upper belly pain, loss of appetite, nausea, light-colored stool, dark yellow or brown urine, yellowing skin or eyes, unusual weakness or fatigue Pain, tingling, or numbness in the hands or feet, muscle weakness, change in vision, confusion or trouble speaking, loss of balance or coordination, trouble walking, seizures Rash, fever, and swollen lymph  nodes Redness, blistering, peeling, or loosening of the skin, including inside the mouth Sudden or severe stomach pain, bloody diarrhea, fever, nausea, vomiting Side effects that usually do not require medical attention (report these to your care team if they continue or are bothersome): Bone, joint, or muscle pain Diarrhea Fatigue Loss of appetite Nausea Skin rash This list may not describe all possible side effects. Call your doctor for medical advice about side effects. You may report side effects to FDA at 1-800-FDA-1088. Where should I keep my medication? This medication is given in a hospital or clinic. It will not be stored at home. NOTE: This sheet is a summary. It may not cover all possible information. If you have questions about this medicine, talk to your doctor, pharmacist, or health care provider.  2023 Elsevier/Gold Standard (2021-10-18 00:00:00)  Paclitaxel Injection What is this medication? PACLITAXEL (PAK li TAX el) treats some types of cancer. It works by slowing down the growth of cancer cells. This medicine may be used for other purposes; ask your health care provider or pharmacist if you have questions. COMMON BRAND NAME(S): Onxol, Taxol What should I tell my care team before I take this medication? They need to know if you have any of these conditions: Heart disease Liver disease Low white blood cell levels An unusual or allergic reaction to paclitaxel, other medications, foods, dyes, or preservatives If you or your partner are pregnant or trying to get pregnant Breast-feeding How should I use this medication? This medication is injected into a vein. It is given by your care team in a hospital or clinic setting. Talk to your care team about the use of this medication in children. While it may be given to children for selected conditions, precautions do apply. Overdosage: If you think you have taken too much of this medicine contact a poison control center or  emergency room at once. NOTE: This medicine is only for you. Do not share this medicine with others. What if I miss a dose? Keep appointments for follow-up doses. It is important not to miss your dose. Call your care team if you are unable to keep an appointment. What may interact with this medication? Do not take this medication with any of the following: Live virus vaccines Other medications may affect the way this medication works. Talk with your care team about all of the medications you take. They may suggest changes to your treatment plan to lower the risk of side effects and to make sure your medications work as intended. This list may not describe all possible interactions. Give your health care provider a list of all the medicines, herbs, non-prescription drugs, or dietary supplements you use. Also tell them if you smoke, drink alcohol, or use illegal drugs. Some items may interact with your medicine. What should I watch for while using this medication? Your condition will be monitored carefully while you are receiving this medication. You may need blood work while taking this medication. This medication may make you feel generally unwell. This is not uncommon as chemotherapy can  affect healthy cells as well as cancer cells. Report any side effects. Continue your course of treatment even though you feel ill unless your care team tells you to stop. This medication can cause serious allergic reactions. To reduce the risk, your care team may give you other medications to take before receiving this one. Be sure to follow the directions from your care team. This medication may increase your risk of getting an infection. Call your care team for advice if you get a fever, chills, sore throat, or other symptoms of a cold or flu. Do not treat yourself. Try to avoid being around people who are sick. This medication may increase your risk to bruise or bleed. Call your care team if you notice any unusual  bleeding. Be careful brushing or flossing your teeth or using a toothpick because you may get an infection or bleed more easily. If you have any dental work done, tell your dentist you are receiving this medication. Talk to your care team if you may be pregnant. Serious birth defects can occur if you take this medication during pregnancy. Talk to your care team before breastfeeding. Changes to your treatment plan may be needed. What side effects may I notice from receiving this medication? Side effects that you should report to your care team as soon as possible: Allergic reactions--skin rash, itching, hives, swelling of the face, lips, tongue, or throat Heart rhythm changes--fast or irregular heartbeat, dizziness, feeling faint or lightheaded, chest pain, trouble breathing Increase in blood pressure Infection--fever, chills, cough, sore throat, wounds that don't heal, pain or trouble when passing urine, general feeling of discomfort or being unwell Low blood pressure--dizziness, feeling faint or lightheaded, blurry vision Low red blood cell level--unusual weakness or fatigue, dizziness, headache, trouble breathing Painful swelling, warmth, or redness of the skin, blisters or sores at the infusion site Pain, tingling, or numbness in the hands or feet Slow heartbeat--dizziness, feeling faint or lightheaded, confusion, trouble breathing, unusual weakness or fatigue Unusual bruising or bleeding Side effects that usually do not require medical attention (report to your care team if they continue or are bothersome): Diarrhea Hair loss Joint pain Loss of appetite Muscle pain Nausea Vomiting This list may not describe all possible side effects. Call your doctor for medical advice about side effects. You may report side effects to FDA at 1-800-FDA-1088. Where should I keep my medication? This medication is given in a hospital or clinic. It will not be stored at home. NOTE: This sheet is a summary.  It may not cover all possible information. If you have questions about this medicine, talk to your doctor, pharmacist, or health care provider.  2023 Elsevier/Gold Standard (2021-10-20 00:00:00)  Carboplatin Injection What is this medication? CARBOPLATIN (KAR boe pla tin) treats some types of cancer. It works by slowing down the growth of cancer cells. This medicine may be used for other purposes; ask your health care provider or pharmacist if you have questions. COMMON BRAND NAME(S): Paraplatin What should I tell my care team before I take this medication? They need to know if you have any of these conditions: Blood disorders Hearing problems Kidney disease Recent or ongoing radiation therapy An unusual or allergic reaction to carboplatin, cisplatin, other medications, foods, dyes, or preservatives Pregnant or trying to get pregnant Breast-feeding How should I use this medication? This medication is injected into a vein. It is given by your care team in a hospital or clinic setting. Talk to your care team about the use  of this medication in children. Special care may be needed. Overdosage: If you think you have taken too much of this medicine contact a poison control center or emergency room at once. NOTE: This medicine is only for you. Do not share this medicine with others. What if I miss a dose? Keep appointments for follow-up doses. It is important not to miss your dose. Call your care team if you are unable to keep an appointment. What may interact with this medication? Medications for seizures Some antibiotics, such as amikacin, gentamicin, neomycin, streptomycin, tobramycin Vaccines This list may not describe all possible interactions. Give your health care provider a list of all the medicines, herbs, non-prescription drugs, or dietary supplements you use. Also tell them if you smoke, drink alcohol, or use illegal drugs. Some items may interact with your medicine. What should I  watch for while using this medication? Your condition will be monitored carefully while you are receiving this medication. You may need blood work while taking this medication. This medication may make you feel generally unwell. This is not uncommon, as chemotherapy can affect healthy cells as well as cancer cells. Report any side effects. Continue your course of treatment even though you feel ill unless your care team tells you to stop. In some cases, you may be given additional medications to help with side effects. Follow all directions for their use. This medication may increase your risk of getting an infection. Call your care team for advice if you get a fever, chills, sore throat, or other symptoms of a cold or flu. Do not treat yourself. Try to avoid being around people who are sick. Avoid taking medications that contain aspirin, acetaminophen, ibuprofen, naproxen, or ketoprofen unless instructed by your care team. These medications may hide a fever. Be careful brushing or flossing your teeth or using a toothpick because you may get an infection or bleed more easily. If you have any dental work done, tell your dentist you are receiving this medication. Talk to your care team if you wish to become pregnant or think you might be pregnant. This medication can cause serious birth defects. Talk to your care team about effective forms of contraception. Do not breast-feed while taking this medication. What side effects may I notice from receiving this medication? Side effects that you should report to your care team as soon as possible: Allergic reactions--skin rash, itching, hives, swelling of the face, lips, tongue, or throat Infection--fever, chills, cough, sore throat, wounds that don't heal, pain or trouble when passing urine, general feeling of discomfort or being unwell Low red blood cell level--unusual weakness or fatigue, dizziness, headache, trouble breathing Pain, tingling, or numbness in  the hands or feet, muscle weakness, change in vision, confusion or trouble speaking, loss of balance or coordination, trouble walking, seizures Unusual bruising or bleeding Side effects that usually do not require medical attention (report to your care team if they continue or are bothersome): Hair loss Nausea Unusual weakness or fatigue Vomiting This list may not describe all possible side effects. Call your doctor for medical advice about side effects. You may report side effects to FDA at 1-800-FDA-1088. Where should I keep my medication? This medication is given in a hospital or clinic. It will not be stored at home. NOTE: This sheet is a summary. It may not cover all possible information. If you have questions about this medicine, talk to your doctor, pharmacist, or health care provider.  2023 Elsevier/Gold Standard (2021-09-27 00:00:00)

## 2022-01-31 NOTE — Progress Notes (Signed)
Pt elected to not enroll in study.  Auth still pending but per [place] we can proceed with trmt

## 2022-01-31 NOTE — Progress Notes (Signed)
Tiffany [name] Progress Note  Holiday representative met with patient to provide support during first infusion. Patient is accompanied by her partner, Tiffany [name].  Pt reports feeling better today as she feels like something is finally being done to treat the cancer.  She has connected with a peer mentor and will notify this [name] when ready to schedule another counseling appt.  Currently, pt is focused on medical costs. She has spoken with [employer] and will work on documents for [employer] and co-pay assistance. [name] sent pt information on CancerCare and Casting for [facility] for potential additional assistance.    [name], LCSW

## 2022-02-01 ENCOUNTER — Telehealth: Payer: Self-pay

## 2022-02-01 ENCOUNTER — Ambulatory Visit (HOSPITAL_COMMUNITY): Payer: No Typology Code available for payment source

## 2022-02-01 ENCOUNTER — Other Ambulatory Visit (HOSPITAL_COMMUNITY): Payer: No Typology Code available for payment source

## 2022-02-01 NOTE — Telephone Encounter (Signed)
Told Tiffany [name] that the form was received on 01-24-22. The form is not completed. It can take 7-10 business days to complete.  Pt verbalized understanding.

## 2022-02-01 NOTE — Telephone Encounter (Signed)
Pt called office stating she is returning Dr. Lulu Riding call from a few days ago.   Pt aware Dr.Tucker is in the OR and will return call between cases. Pt voiced an understanding.

## 2022-02-02 ENCOUNTER — Telehealth: Payer: Self-pay | Admitting: Gynecologic Oncology

## 2022-02-02 ENCOUNTER — Telehealth: Payer: Self-pay

## 2022-02-02 NOTE — Telephone Encounter (Signed)
LM for patient that this nurse was calling to see how they were doing after their treatment. Please call back to Dr. Gorsuch's nurse at [phone] if they have any questions or concerns regarding the treatment. 

## 2022-02-02 NOTE — Telephone Encounter (Signed)
-----   Message from Charleston Poot, RN sent at 01/31/2022  3:35 PM EDT ----- Regarding: [place] Fordyce time/ paclitaxel, carboplatin, jemperli/ Dr Alvy Bimler pt Hello,  Pt had first time paclitaxel, carboplatin, and jemperli today. Tolerated treatment well.  Thanks, Libbie K.

## 2022-02-02 NOTE — Telephone Encounter (Signed)
Called patient to check in. She is doing well. Noticed some mild chest pressure last night, several times today. Not bothersome, describes as feeling like heart burn. Denies associated symptoms like chest pain or shortness of breath. Reviewed precautions of develops worse or new symptoms. Discussed treatment plan again. All questions answered.  Jeral Pinch MD Gynecologic Oncology

## 2022-02-03 ENCOUNTER — Other Ambulatory Visit: Payer: Self-pay

## 2022-02-03 ENCOUNTER — Telehealth: Payer: Self-pay

## 2022-02-03 ENCOUNTER — Other Ambulatory Visit: Payer: Self-pay | Admitting: Hematology and Oncology

## 2022-02-03 DIAGNOSIS — C539 Malignant neoplasm of cervix uteri, unspecified: Secondary | ICD-10-CM

## 2022-02-03 MED ORDER — OXYCODONE HCL 5 MG PO TABS: 5.0000 mg | ORAL_TABLET | ORAL | 0 refills | Status: DC | PRN

## 2022-02-03 NOTE — Telephone Encounter (Signed)
Tiffany [name] oxycodone 5 mg in stock.

## 2022-02-03 NOTE — Telephone Encounter (Signed)
Done

## 2022-02-03 NOTE — Telephone Encounter (Signed)
Returned her call. She is asking for a Rx for pain. The tylenol has not helped. She ask if you could send to [place].

## 2022-02-03 NOTE — Telephone Encounter (Signed)
Returned her call. She is complaining of pelvic and joint pain that started yesterday. She did not take anything because she was not sure if it would be okay.  Instructed to take Tylenol every 6 hours for pain. Ask if she needed something stronger. She declined. She wants to try the tylenol first and will call the office back if Rx needed.

## 2022-02-03 NOTE — Telephone Encounter (Signed)
Called and left a message Rx sent for oxycodone Rx.

## 2022-02-03 NOTE — Telephone Encounter (Signed)
Can you call [facility] and ask if they stock oxycodone? If not, I will send to [facility]

## 2022-02-06 ENCOUNTER — Other Ambulatory Visit: Payer: Self-pay | Admitting: Hematology and Oncology

## 2022-02-06 ENCOUNTER — Other Ambulatory Visit: Payer: Self-pay | Admitting: *Deleted

## 2022-02-06 DIAGNOSIS — C539 Malignant neoplasm of cervix uteri, unspecified: Secondary | ICD-10-CM

## 2022-02-06 NOTE — Progress Notes (Signed)
The proposed treatment discussed in conference is for discussion purpose only and is not a binding recommendation.  The patients have not been physically examined, or presented with their treatment options.  Therefore, final treatment plans cannot be decided.  

## 2022-02-06 NOTE — Progress Notes (Signed)
OFF PATHWAY REGIMEN - Uterine  No Change  Continue With Treatment as Ordered.  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

## 2022-02-07 ENCOUNTER — Telehealth: Payer: Self-pay | Admitting: *Deleted

## 2022-02-07 ENCOUNTER — Inpatient Hospital Stay (HOSPITAL_BASED_OUTPATIENT_CLINIC_OR_DEPARTMENT_OTHER): Payer: No Typology Code available for payment source | Admitting: Hematology and Oncology

## 2022-02-07 ENCOUNTER — Telehealth: Payer: Self-pay

## 2022-02-07 ENCOUNTER — Inpatient Hospital Stay: Payer: No Typology Code available for payment source | Admitting: Licensed Clinical Social Worker

## 2022-02-07 ENCOUNTER — Encounter: Payer: Self-pay | Admitting: Hematology and Oncology

## 2022-02-07 DIAGNOSIS — R64 Cachexia: Secondary | ICD-10-CM

## 2022-02-07 DIAGNOSIS — C539 Malignant neoplasm of cervix uteri, unspecified: Secondary | ICD-10-CM

## 2022-02-07 DIAGNOSIS — F419 Anxiety disorder, unspecified: Secondary | ICD-10-CM | POA: Diagnosis not present

## 2022-02-07 DIAGNOSIS — N950 Postmenopausal bleeding: Secondary | ICD-10-CM

## 2022-02-07 DIAGNOSIS — Z5112 Encounter for antineoplastic immunotherapy: Secondary | ICD-10-CM | POA: Diagnosis not present

## 2022-02-07 NOTE — Progress Notes (Signed)
[place] CSW Progress Note  Holiday representative met with patient to return Hydrographic surveyor with completed medical page. Showed pt remaining information needed as well as supporting documentation. Discussed additional resources of Casting for [facility] and complementary classes (tai chi, Reiki) and e-mailed September calendars for [place].  Pt had questions about her disability paperwork. Provided contact information for RN who is handling that paperwork.  Pt has also been in contact with [employer] re: grant and co-pay assistance. Pt will send proof of income to her.    Kadance Mccuistion E Dell Briner, LCSW

## 2022-02-07 NOTE — Assessment & Plan Note (Signed)
Her bleeding has subsided although she has some intermittent vaginal discharge She is reassured that this is a normal process to be expected

## 2022-02-07 NOTE — Telephone Encounter (Signed)
She called and left a message requesting Rx for [facility] at [phone], faxed to provided # and received fax confirmation.

## 2022-02-07 NOTE — Assessment & Plan Note (Signed)
Overall, I felt that she is clinically stable The vaginal discharge is to be expected She denies significant vaginal bleeding and overall, she tolerated chemotherapy very well My plan will be to continue I will prescribe treatment for 3 cycles with plan to repeat imaging study afterwards for objective assessment of response to therapy Per patient request, I have reviewed the case with her friend who is a physician and addressed all his questions

## 2022-02-07 NOTE — Assessment & Plan Note (Signed)
She is profoundly anxious I reassured the patient to the best of my ability She is seen by Education officer, museum today for additional support

## 2022-02-07 NOTE — Assessment & Plan Note (Signed)
Her appetite is stable We discussed the importance of frequent small meals We will keep low-dose Remeron for now I do not recommend the patient to pursue additional over-the-counter supplement

## 2022-02-07 NOTE — Telephone Encounter (Signed)
[place] paperwork completed by this nurse at this time.  Folder placed in designated mail bin for collaborative pick up for provider review, signature, and return.

## 2022-02-07 NOTE — Progress Notes (Signed)
[place] OFFICE PROGRESS NOTE  Patient Care Team: Lawerance Cruel, MD as PCP - General (Family Medicine)  ASSESSMENT & PLAN:  Uterine cancer ([place]) Overall, I felt that she is clinically stable The vaginal discharge is to be expected She denies significant vaginal bleeding and overall, she tolerated chemotherapy very well My plan will be to continue I will prescribe treatment for 3 cycles with plan to repeat imaging study afterwards for objective assessment of response to therapy Per patient request, I have reviewed the case with her friend who is a physician and addressed all his questions  Malignant cachexia ([place]) Her appetite is stable We discussed the importance of frequent small meals We will keep low-dose Remeron for now I do not recommend the patient to pursue additional over-the-counter supplement  Postmenopausal bleeding Her bleeding has subsided although she has some intermittent vaginal discharge She is reassured that this is a normal process to be expected  Anxiety She is profoundly anxious I reassured the patient to the best of my ability She is seen by social worker today for additional support  No orders of the defined types were placed in this encounter.   All questions were answered. The patient knows to call the clinic with any problems, questions or concerns. The total time spent in the appointment was 30 minutes encounter with patients including review of chart and various tests results, discussions about plan of care and coordination of care plan   [name], MD 02/07/2022 4:47 PM  INTERVAL HISTORY: Please see below for problem oriented charting. she returns for treatment follow-up She has completed recent treatment She complain of foul-smelling discharge which bothers her a lot She has minimal vaginal bleeding Her appetite is stable and she has not lost any weight Remeron appears to be somewhat helpful but caused excessive sedation  and she can only tolerate a very small dose Denies signs or symptoms of peripheral neuropathy REVIEW OF SYSTEMS:   Constitutional: Denies fevers, [name], nose, mouth, throat, [name], [name], chest discomfort or lower extremity swelling Gastrointestinal:  Denies nausea, heartburn or change in bowel habits Skin: Denies abnormal skin rashes Lymphatics: Denies new lymphadenopathy or easy bruising Neurological:Denies numbness, tingling or new weaknesses Behavioral/Psych: Mood is stable, no new changes  All other systems were reviewed with the patient and are negative.  I have reviewed the past medical history, past surgical history, social history and family history with the patient and they are unchanged from previous note.  ALLERGIES:  is allergic to seasonal ic [octacosanol] and latex.  MEDICATIONS:  Current Outpatient Medications  Medication Sig Dispense Refill   ALPRAZolam (XANAX) 0.25 MG tablet Take 0.25 mg by mouth every 8 (eight) hours as needed for anxiety.     dexamethasone (DECADRON) 4 MG tablet Take 2 tablets by mouth the night before and 2 tabs the morning of chemotherapy, every 3 weeks, for 6 cycles 36 tablet 6   mirtazapine (REMERON) 15 MG tablet Take 1 tablet (15 mg total) by mouth at bedtime. 30 tablet 3   oxyCODONE (OXY IR/ROXICODONE) 5 MG immediate release tablet Take 1 tablet (5 mg total) by mouth every 4 (four) hours as needed for severe pain. 30 tablet 0   No current facility-administered medications for this visit.    SUMMARY OF ONCOLOGIC HISTORY: Oncology History Overview Note  HER2 negative   Uterine  cancer ([place])  12/03/2021 Initial Diagnosis   Patient reports several month history of intermittent pelvic pain that she describes as discomfort or sometimes cramping, that has worsened with time.   She notes that this is tolerable.  She began having postmenopausal bleeding started on 6/17 with a few spots of blood.     12/29/2021 Imaging   1. Bilateral large multiloculated cystic lesions of the ovaries, concerning for ovarian malignancy. Recommend gynecologic consultation and pelvic ultrasound and/or pelvic MRI with contrast for further evaluation. 2. Large heterogeneous mass of the posterior pelvis likely arising from the lower uterus, possibly a fibroid, although malignancy is also a concern. Recommend attention on pelvic ultrasound or MRI as above. 3. Additional mass arising from the right uterine fundus which correlates with fibroid described on prior 2015 ultrasound. 4. Nonspecific small ground-glass nodule of the right lower lobe measuring 6 mm. Recommend follow-up chest CT in 6 months for further evaluation. 5. Aortic Atherosclerosis (ICD10-I70.0).   12/30/2021 Pathology Results   A. CERVIX, BIOPSY:  -  Scantly cellular specimen with predominantly blood, fibrin and acute inflammatory infiltrate but with scattered highly atypical cells highly suspicious for malignancy.   Note: A p16 shows strong positivity in these scattered cells; however, while p53 is mildly increased definitive strong overexpression or under expression is not identified.  These findings support the suspicion for malignancy; however, are insufficient for a definitive diagnosis.     01/03/2022 Imaging   MRI pelvis  1. Large mixed solid and cystic lesions of the bilateral ovaries, measuring 8.4 x 7.0 x 5.4 cm on the right and 10.3 x 8.7 x 8.1 cm on the left, highly concerning for primary ovarian malignancy and contralateral ovarian metastasis.   2. Homogeneously enhancing mass arising from the right aspect of the uterine body and fundus measuring 6.0 x 5.5 x 4.3 cm, consistent with a uterine fibroid and similar to prior ultrasound dated 2015.   3. An additional, much more heterogeneous mass which appears to arise from  the cervix or posterior lower uterine segment measuring 8.1 x 6.4 x 6.2 cm. This may reflect an additional fibroid with internal degeneration, however this was not clearly visualized on ultrasound dated 2015 and is highly suspicious for a cervical mass as significant enlargement of benign fibroids is not expected following menopause. This mass appears to closely abut and compress or perhaps even directly involve the adjacent rectum.   4.  No evidence of lymphadenopathy in the pelvis.   01/12/2022 PET scan   1. Hypermetabolic heterogeneous mass centered on the cervix/lower uterine segment is most consistent with primary gynecologic neoplasm. 2. Multiloculated complex bilateral adnexal cystic lesions with hypermetabolic soft tissue component likely reflect metastatic lesions. 3. No hypermetabolic abdominopelvic adenopathy. 4. No evidence of hypermetabolic metastatic disease in the chest or abdomen.   01/18/2022 Pathology Results   FINAL MICROSCOPIC DIAGNOSIS:   A. CERVICAL MASS, BIOPSIES:  - Poorly differentiated carcinoma with necrosis, consistent with high-grade serous carcinoma, see comment   B. CERVICAL MASS, EXCISION:  - Poorly differentiated carcinoma with necrosis, consistent with high-grade serous carcinoma see comment  - Edges of resected tissue fragments are positive for carcinoma   COMMENT:   A and B.   The tumor cells are positive for PAX8, ER and p16 immunostains.  Immunostain for p53 shows significant overexpression. Immunostain for CK5/6 shows weak focal labeling while immunostain for  p63 is negative.  The immunoprofile is consistent with above interpretation.  The carcinoma is likely endometrial or ovarian origin  with secondary involvement of the cervix.     01/23/2022 Initial Diagnosis   Uterine cancer ([place])   01/23/2022 Cancer Staging   Staging form: Corpus Uteri - Carcinoma and Carcinosarcoma, AJCC 8th Edition - Clinical stage from 01/23/2022: FIGO Stage IIIA (cT3a, cN0, cM0) -  Signed by [name], MD on 01/23/2022 Stage prefix: Initial diagnosis   01/31/2022 - 01/31/2022 Chemotherapy   Patient is on Treatment Plan : UTERINE ENDOMETRIAL Dostarlimab-gxly (500 mg) + Carboplatin (AUC 5) + Paclitaxel (175 mg/m2) q21d x 6 cycles / Dostarlimab-gxly (1000 mg) q42d x 6 cycles      01/31/2022 -  Chemotherapy   Patient is on Treatment Plan : UTERINE ENDOMETRIAL Dostarlimab-gxly (500 mg) + Carboplatin (AUC 5) + Paclitaxel (175 mg/m2) q21d x 6 cycles / Dostarlimab-gxly (1000 mg) q42d x 6 cycles        PHYSICAL EXAMINATION: ECOG PERFORMANCE STATUS: 1 - Symptomatic but completely ambulatory  Vitals:   02/07/22 1150  BP: 138/81  Pulse: (!) 120  Resp: 18  Temp: 99.4 F (37.4 C)  SpO2: 99%   Filed Weights   02/07/22 1150  Weight: 129 lb 9.6 oz (58.8 kg)    GENERAL:alert, no distress and comfortable NEURO: alert & oriented x 3 with fluent speech, no focal motor/sensory deficits  LABORATORY DATA:  I have reviewed the data as listed    Component Value Date/Time   NA 136 01/30/2022 1310   K 3.8 01/30/2022 1310   CL 108 01/30/2022 1310   CO2 25 01/30/2022 1310   GLUCOSE 111 (H) 01/30/2022 1310   BUN 20 01/30/2022 1310   CREATININE 0.67 01/30/2022 1310   CALCIUM 8.3 (L) 01/30/2022 1310   PROT 6.0 (L) 01/30/2022 1310   ALBUMIN 3.6 01/30/2022 1310   AST 13 (L) 01/30/2022 1310   ALT 11 01/30/2022 1310   ALKPHOS 51 01/30/2022 1310   BILITOT 0.2 (L) 01/30/2022 1310   GFRNONAA >60 01/30/2022 1310    No results found for: "SPEP", "UPEP"  Lab Results  Component Value Date   WBC 11.2 (H) 01/30/2022   NEUTROABS 7.6 01/30/2022   HGB 11.4 (L) 01/30/2022   HCT 34.6 (L) 01/30/2022   MCV 75.2 (L) 01/30/2022   PLT 306 01/30/2022      Chemistry      Component Value Date/Time   NA 136 01/30/2022 1310   K 3.8 01/30/2022 1310   CL 108 01/30/2022 1310   CO2 25 01/30/2022 1310   BUN 20 01/30/2022 1310   CREATININE 0.67 01/30/2022 1310      Component Value  Date/Time   CALCIUM 8.3 (L) 01/30/2022 1310   ALKPHOS 51 01/30/2022 1310   AST 13 (L) 01/30/2022 1310   ALT 11 01/30/2022 1310   BILITOT 0.2 (L) 01/30/2022 1310

## 2022-02-08 NOTE — Telephone Encounter (Signed)
Paperwork returned and received by this nurse today from collaborative/provider. Successfully returned to [facility] via fax [phone]) with treatment and supportive medication list.  Copy to bin designated for record release per (SW) H.I.M.  Process completed with original copy to alphabetical file folder near registrar number one of [facility] appointment registration work area for patient pick up on next scheduled [facility] appointment.  No further instructions received, actions required or performed by this nurse.

## 2022-02-09 ENCOUNTER — Encounter: Payer: No Typology Code available for payment source | Admitting: Gynecologic Oncology

## 2022-02-09 ENCOUNTER — Encounter: Payer: Self-pay | Admitting: Hematology and Oncology

## 2022-02-09 NOTE — Progress Notes (Signed)
Pt is approved for the $1000 [employer] grant.  

## 2022-02-10 ENCOUNTER — Encounter: Payer: Self-pay | Admitting: Licensed Clinical Social Worker

## 2022-02-10 ENCOUNTER — Telehealth: Payer: Self-pay | Admitting: *Deleted

## 2022-02-10 NOTE — Telephone Encounter (Signed)
Pt called and stated that she got a message stating it was time for her mammogram. She was wondering if it was ok for her to get a mammogram since she is getting chemo treatments. Per Joylene John, NP it's ok for pt to get a mammogram. Pt verbalized understanding.

## 2022-02-10 NOTE — Progress Notes (Signed)
[place] CSW Progress Note  Clinical Statistician. Foundation will communicate directly with patient regarding decision on assistance.    Tiffany [name] E Andri Prestia, LCSW

## 2022-02-13 ENCOUNTER — Telehealth: Payer: Self-pay

## 2022-02-13 NOTE — Telephone Encounter (Signed)
Called and given below message from Dr. Alvy Bimler. She verbalized understanding and appreciated the call. She will call the office back for questions or concerns.

## 2022-02-13 NOTE — Telephone Encounter (Signed)
Ok, her symptoms are to be expected

## 2022-02-13 NOTE — Telephone Encounter (Signed)
Returned her call. Yesterday she started having a scant amount of dark blood spotting from her vagina. She was having watery discharge from her vagina but this stopped yesterday.  She is wanting to make Dr. Alvy Bimler aware.

## 2022-02-15 ENCOUNTER — Other Ambulatory Visit (HOSPITAL_COMMUNITY): Payer: Self-pay

## 2022-02-16 ENCOUNTER — Other Ambulatory Visit (HOSPITAL_COMMUNITY): Payer: Self-pay

## 2022-02-17 MED FILL — Dexamethasone Sodium Phosphate Inj 100 MG/10ML: INTRAMUSCULAR | Qty: 1 | Status: AC

## 2022-02-17 MED FILL — Fosaprepitant Dimeglumine For IV Infusion 150 MG (Base Eq): INTRAVENOUS | Qty: 5 | Status: AC

## 2022-02-21 ENCOUNTER — Other Ambulatory Visit: Payer: Self-pay

## 2022-02-21 ENCOUNTER — Inpatient Hospital Stay: Payer: No Typology Code available for payment source

## 2022-02-21 ENCOUNTER — Inpatient Hospital Stay (HOSPITAL_BASED_OUTPATIENT_CLINIC_OR_DEPARTMENT_OTHER): Payer: No Typology Code available for payment source | Admitting: Hematology and Oncology

## 2022-02-21 ENCOUNTER — Encounter: Payer: Self-pay | Admitting: Hematology and Oncology

## 2022-02-21 ENCOUNTER — Inpatient Hospital Stay: Payer: No Typology Code available for payment source | Admitting: Licensed Clinical Social Worker

## 2022-02-21 ENCOUNTER — Inpatient Hospital Stay: Payer: No Typology Code available for payment source | Attending: Gynecologic Oncology

## 2022-02-21 DIAGNOSIS — Z7952 Long term (current) use of systemic steroids: Secondary | ICD-10-CM | POA: Insufficient documentation

## 2022-02-21 DIAGNOSIS — F419 Anxiety disorder, unspecified: Secondary | ICD-10-CM | POA: Diagnosis not present

## 2022-02-21 DIAGNOSIS — R64 Cachexia: Secondary | ICD-10-CM

## 2022-02-21 DIAGNOSIS — R739 Hyperglycemia, unspecified: Secondary | ICD-10-CM | POA: Insufficient documentation

## 2022-02-21 DIAGNOSIS — N83202 Unspecified ovarian cyst, left side: Secondary | ICD-10-CM | POA: Diagnosis not present

## 2022-02-21 DIAGNOSIS — I700 Atherosclerosis of aorta: Secondary | ICD-10-CM | POA: Insufficient documentation

## 2022-02-21 DIAGNOSIS — G620 Drug-induced polyneuropathy: Secondary | ICD-10-CM

## 2022-02-21 DIAGNOSIS — Z5112 Encounter for antineoplastic immunotherapy: Secondary | ICD-10-CM | POA: Insufficient documentation

## 2022-02-21 DIAGNOSIS — N83201 Unspecified ovarian cyst, right side: Secondary | ICD-10-CM | POA: Insufficient documentation

## 2022-02-21 DIAGNOSIS — C539 Malignant neoplasm of cervix uteri, unspecified: Secondary | ICD-10-CM | POA: Insufficient documentation

## 2022-02-21 DIAGNOSIS — T451X5A Adverse effect of antineoplastic and immunosuppressive drugs, initial encounter: Secondary | ICD-10-CM | POA: Diagnosis not present

## 2022-02-21 DIAGNOSIS — N950 Postmenopausal bleeding: Secondary | ICD-10-CM

## 2022-02-21 DIAGNOSIS — Z5111 Encounter for antineoplastic chemotherapy: Secondary | ICD-10-CM | POA: Diagnosis present

## 2022-02-21 DIAGNOSIS — Z79899 Other long term (current) drug therapy: Secondary | ICD-10-CM | POA: Insufficient documentation

## 2022-02-21 DIAGNOSIS — R0981 Nasal congestion: Secondary | ICD-10-CM | POA: Diagnosis not present

## 2022-02-21 LAB — CMP (CANCER CENTER ONLY)
ALT: 15 U/L (ref 0–44)
AST: 13 U/L — ABNORMAL LOW (ref 15–41)
Albumin: 4.2 g/dL (ref 3.5–5.0)
Alkaline Phosphatase: 80 U/L (ref 38–126)
Anion gap: 9 (ref 5–15)
BUN: 16 mg/dL (ref 6–20)
CO2: 23 mmol/L (ref 22–32)
Calcium: 9.5 mg/dL (ref 8.9–10.3)
Chloride: 105 mmol/L (ref 98–111)
Creatinine: 0.71 mg/dL (ref 0.44–1.00)
GFR, Estimated: >60 mL/min (ref >60)
Glucose, Bld: 173 mg/dL — ABNORMAL HIGH (ref 70–99)
Potassium: 4 mmol/L (ref 3.5–5.1)
Sodium: 137 mmol/L (ref 135–145)
Total Bilirubin: 0.3 mg/dL (ref 0.3–1.2)
Total Protein: 7.1 g/dL (ref 6.5–8.1)

## 2022-02-21 LAB — CBC WITH DIFFERENTIAL (CANCER CENTER ONLY)
Abs Immature Granulocytes: 0.26 10*3/uL — ABNORMAL HIGH (ref 0.00–0.07)
Basophils Absolute: 0 10*3/uL (ref 0.0–0.1)
Basophils Relative: 0 %
Eosinophils Absolute: 0 10*3/uL (ref 0.0–0.5)
Eosinophils Relative: 0 %
HCT: 35 % — ABNORMAL LOW (ref 36.0–46.0)
Hemoglobin: 11.9 g/dL — ABNORMAL LOW (ref 12.0–15.0)
Immature Granulocytes: 2 %
Lymphocytes Relative: 16 %
Lymphs Abs: 2 10*3/uL (ref 0.7–4.0)
MCH: 24.6 pg — ABNORMAL LOW (ref 26.0–34.0)
MCHC: 34 g/dL (ref 30.0–36.0)
MCV: 72.3 fL — ABNORMAL LOW (ref 80.0–100.0)
Monocytes Absolute: 0.1 10*3/uL (ref 0.1–1.0)
Monocytes Relative: 1 %
Neutro Abs: 10 10*3/uL — ABNORMAL HIGH (ref 1.7–7.7)
Neutrophils Relative %: 81 %
Platelet Count: 246 10*3/uL (ref 150–400)
RBC: 4.84 MIL/uL (ref 3.87–5.11)
RDW: 13.7 % (ref 11.5–15.5)
WBC Count: 12.5 10*3/uL — ABNORMAL HIGH (ref 4.0–10.5)
nRBC: 0 % (ref 0.0–0.2)

## 2022-02-21 MED ORDER — HEPARIN SOD (PORK) LOCK FLUSH 100 UNIT/ML IV SOLN
500.0000 [IU] | Freq: Once | INTRAVENOUS | Status: AC | PRN
  Administered 2022-02-21: 500 [IU]

## 2022-02-21 MED ORDER — SODIUM CHLORIDE 0.9 % IV SOLN
10.0000 mg | Freq: Once | INTRAVENOUS | Status: AC
  Administered 2022-02-21: 10 mg via INTRAVENOUS
  Filled 2022-02-21: qty 10

## 2022-02-21 MED ORDER — CETIRIZINE HCL 10 MG/ML IV SOLN
10.0000 mg | Freq: Once | INTRAVENOUS | Status: AC
  Administered 2022-02-21: 10 mg via INTRAVENOUS
  Filled 2022-02-21: qty 1

## 2022-02-21 MED ORDER — PALONOSETRON HCL INJECTION 0.25 MG/5ML
0.2500 mg | Freq: Once | INTRAVENOUS | Status: AC
  Administered 2022-02-21: 0.25 mg via INTRAVENOUS
  Filled 2022-02-21: qty 5

## 2022-02-21 MED ORDER — SODIUM CHLORIDE 0.9 % IV SOLN
470.0000 mg | Freq: Once | INTRAVENOUS | Status: AC
  Administered 2022-02-21: 470 mg via INTRAVENOUS
  Filled 2022-02-21: qty 47

## 2022-02-21 MED ORDER — SODIUM CHLORIDE 0.9 % IV SOLN
500.0000 mg | Freq: Once | INTRAVENOUS | Status: AC
  Administered 2022-02-21: 500 mg via INTRAVENOUS
  Filled 2022-02-21: qty 10

## 2022-02-21 MED ORDER — SODIUM CHLORIDE 0.9% FLUSH
10.0000 mL | Freq: Once | INTRAVENOUS | Status: AC
  Administered 2022-02-21: 10 mL

## 2022-02-21 MED ORDER — SODIUM CHLORIDE 0.9% FLUSH
10.0000 mL | INTRAVENOUS | Status: DC | PRN
  Administered 2022-02-21: 10 mL

## 2022-02-21 MED ORDER — SODIUM CHLORIDE 0.9 % IV SOLN: Freq: Once | INTRAVENOUS | Status: AC

## 2022-02-21 MED ORDER — SODIUM CHLORIDE 0.9 % IV SOLN
175.0000 mg/m2 | Freq: Once | INTRAVENOUS | Status: AC
  Administered 2022-02-21: 282 mg via INTRAVENOUS
  Filled 2022-02-21: qty 47

## 2022-02-21 MED ORDER — FAMOTIDINE IN NACL 20-0.9 MG/50ML-% IV SOLN
20.0000 mg | Freq: Once | INTRAVENOUS | Status: AC
  Administered 2022-02-21: 20 mg via INTRAVENOUS
  Filled 2022-02-21: qty 50

## 2022-02-21 MED ORDER — SODIUM CHLORIDE 0.9 % IV SOLN
150.0000 mg | Freq: Once | INTRAVENOUS | Status: AC
  Administered 2022-02-21: 150 mg via INTRAVENOUS
  Filled 2022-02-21: qty 150

## 2022-02-21 NOTE — Assessment & Plan Note (Signed)
She have minor postmenopausal bleeding but resolving She is mildly anemic but not symptomatic Observe for now

## 2022-02-21 NOTE — Assessment & Plan Note (Signed)
She has lost some weight Previously, I recommend the patient to take mirtazapine but I believe she has stopped taking it Recommend frequent small meals

## 2022-02-21 NOTE — Assessment & Plan Note (Signed)
she has mild peripheral neuropathy, [name], [name], I will consider modifying the dose of the treatment  

## 2022-02-21 NOTE — Patient Instructions (Signed)
[facility]  Discharge Instructions: Thank you for choosing [facility] to provide your oncology and hematology care.   If you have a lab appointment with the [facility], please go directly to the [facility] and check in at the registration area.   Wear comfortable clothing and clothing appropriate for easy access to any Portacath or PICC line.   We strive to give you quality time with your provider. You may need to reschedule your appointment if you arrive late (15 or more minutes).  Arriving late affects you and other patients whose appointments are after yours.  Also, if you miss three or more appointments without notifying the office, you may be dismissed from the clinic at the provider's discretion.      For prescription refill requests, have your pharmacy contact our office and allow 72 hours for refills to be completed.    Today you received the following chemotherapy and/or immunotherapy agents: Jemperli, Paclitaxel, Carboplatin.   To help prevent nausea and vomiting after your treatment, we encourage you to take your nausea medication as directed.  BELOW ARE SYMPTOMS THAT SHOULD BE REPORTED IMMEDIATELY: *FEVER GREATER THAN 100.4 F (38 C) OR HIGHER *CHILLS OR SWEATING *NAUSEA AND VOMITING THAT IS NOT CONTROLLED WITH YOUR NAUSEA MEDICATION *UNUSUAL SHORTNESS OF BREATH *UNUSUAL BRUISING OR BLEEDING *URINARY PROBLEMS (pain or burning when urinating, or frequent urination) *BOWEL PROBLEMS (unusual diarrhea, constipation, pain near the anus) TENDERNESS IN MOUTH AND THROAT WITH OR WITHOUT PRESENCE OF ULCERS (sore throat, sores in mouth, or a toothache) UNUSUAL RASH, SWELLING OR PAIN  UNUSUAL VAGINAL DISCHARGE OR ITCHING   Items with * indicate a potential emergency and should be followed up as soon as possible or go to the Emergency Department if any problems should occur.  Please show the CHEMOTHERAPY ALERT CARD or IMMUNOTHERAPY  ALERT CARD at check-in to the Emergency Department and triage nurse.  Should you have questions after your visit or need to cancel or reschedule your appointment, please contact [facility]  Dept: [phone]  and follow the prompts.  Office hours are 8:00 a.m. to 4:30 p.m. Monday - Friday. Please note that voicemails left after 4:00 p.m. may not be returned until the following business day.  We are closed weekends and major holidays. You have access to a nurse at all times for urgent questions. Please call the main number to the clinic Dept: [phone] and follow the prompts.   For any non-urgent questions, you may also contact your provider using MyChart. We now offer e-Visits for anyone 28 and older to request care online for non-urgent symptoms. For details visit [email or web address].   Also download the MyChart app! Go to the app store, search "MyChart", open the app, select [facility], and log in with your MyChart username and password.  Masks are optional in the cancer centers. If you would like for your care team to wear a mask while they are taking care of you, please let them know. You may have one support person who is at least 60 years old accompany you for your appointments.  Dostarlimab Injection What is this medication? DOSTARLIMAB (dos tar li mab) treats some types of cancer. It works by helping your immune system slow or stop the spread of cancer cells. It is a monoclonal antibody. This medicine may be used for other purposes; ask your health care provider or pharmacist if you have questions. COMMON BRAND NAME(S): Jemperli What should I  tell my care team before I take this medication? They need to know if you have any of these conditions: Allogeneic stem cell transplant (uses someone else's stem cells) Autoimmune diseases, such as Crohn disease, ulcerative colitis, lupus History of chest radiation Nervous system problems, such as  Guillain-Barre syndrome, myasthenia gravis Organ transplant An unusual or allergic reaction to dostarlimab, other medications, foods, dyes, or preservatives Pregnant or trying to get pregnant Breast-feeding How should I use this medication? This medication is injected into a vein. It is given by your care team in a hospital or clinic setting. A special MedGuide will be given to you before each treatment. Be sure to read this information carefully each time. Talk to your care team about the use of this medication in children. Special care may be needed. Overdosage: If you think you have taken too much of this medicine contact a poison control center or emergency room at once. NOTE: This medicine is only for you. Do not share this medicine with others. What if I miss a dose? Keep appointments for follow-up doses. It is important not to miss your dose. Call your care team if you are unable to keep an appointment. What may interact with this medication? Interactions have not been studied. This list may not describe all possible interactions. Give your health care provider a list of all the medicines, herbs, non-prescription drugs, or dietary supplements you use. Also tell them if you smoke, drink alcohol, or use illegal drugs. Some items may interact with your medicine. What should I watch for while using this medication? Your condition will be monitored carefully while you are receiving this medication. You may need blood work while taking this medication. This medication may cause serious skin reactions. They can happen weeks to months after starting the medication. Contact your care team right away if you notice fevers or flu-like symptoms with a rash. The rash may be red or purple and then turn into blisters or peeling of the skin. You may also notice a red rash with swelling of the face, lips, or lymph nodes in your neck or under your arms. Tell your care team right away if you have any change in  your eyesight. Talk to your care team if you may be pregnant. Serious birth defects can occur if you take this medication during pregnancy and for 4 months after the last dose. You will need a negative pregnancy test before starting this medication. Contraception is recommended while taking this medication and for 4 months after the last dose. Your care team can help you find the option that works for you. Do not breastfeed while taking this medication and for 4 months after the last dose. What side effects may I notice from receiving this medication? Side effects that you should report to your care team as soon as possible: Allergic reactions--skin rash, itching, hives, swelling of the face, lips, tongue, or throat Dry cough, shortness of breath or trouble breathing Eye pain, redness, irritation, or discharge with blurry or decreased vision Heart muscle inflammation--unusual weakness or fatigue, shortness of breath, chest pain, fast or irregular heartbeat, dizziness, swelling of the ankles, feet, or hands Hormone gland problems--headache, sensitivity to light, unusual weakness or fatigue, dizziness, fast or irregular heartbeat, increased sensitivity to cold or heat, excessive sweating, constipation, hair loss, increased thirst or amount of urine, tremors or shaking, irritability Infusion reactions--chest pain, shortness of breath or trouble breathing, feeling faint or lightheaded Kidney injury (glomerulonephritis)--decrease in the amount of urine,  red or dark brown urine, foamy or bubbly urine, swelling of the ankles, hands, or feet Liver injury--right upper belly pain, loss of appetite, nausea, light-colored stool, dark yellow or brown urine, yellowing skin or eyes, unusual weakness or fatigue Pain, tingling, or numbness in the hands or feet, muscle weakness, change in vision, confusion or trouble speaking, loss of balance or coordination, trouble walking, seizures Rash, fever, and swollen lymph  nodes Redness, blistering, peeling, or loosening of the skin, including inside the mouth Sudden or severe stomach pain, bloody diarrhea, fever, nausea, vomiting Side effects that usually do not require medical attention (report these to your care team if they continue or are bothersome): Bone, joint, or muscle pain Diarrhea Fatigue Loss of appetite Nausea Skin rash This list may not describe all possible side effects. Call your doctor for medical advice about side effects. You may report side effects to FDA at 1-800-FDA-1088. Where should I keep my medication? This medication is given in a hospital or clinic. It will not be stored at home. NOTE: This sheet is a summary. It may not cover all possible information. If you have questions about this medicine, talk to your doctor, pharmacist, or health care provider.  2023 Elsevier/Gold Standard (2021-10-18 00:00:00)  Paclitaxel Injection What is this medication? PACLITAXEL (PAK li TAX el) treats some types of cancer. It works by slowing down the growth of cancer cells. This medicine may be used for other purposes; ask your health care provider or pharmacist if you have questions. COMMON BRAND NAME(S): Onxol, Taxol What should I tell my care team before I take this medication? They need to know if you have any of these conditions: Heart disease Liver disease Low white blood cell levels An unusual or allergic reaction to paclitaxel, other medications, foods, dyes, or preservatives If you or your partner are pregnant or trying to get pregnant Breast-feeding How should I use this medication? This medication is injected into a vein. It is given by your care team in a hospital or clinic setting. Talk to your care team about the use of this medication in children. While it may be given to children for selected conditions, precautions do apply. Overdosage: If you think you have taken too much of this medicine contact a poison control center or  emergency room at once. NOTE: This medicine is only for you. Do not share this medicine with others. What if I miss a dose? Keep appointments for follow-up doses. It is important not to miss your dose. Call your care team if you are unable to keep an appointment. What may interact with this medication? Do not take this medication with any of the following: Live virus vaccines Other medications may affect the way this medication works. Talk with your care team about all of the medications you take. They may suggest changes to your treatment plan to lower the risk of side effects and to make sure your medications work as intended. This list may not describe all possible interactions. Give your health care provider a list of all the medicines, herbs, non-prescription drugs, or dietary supplements you use. Also tell them if you smoke, drink alcohol, or use illegal drugs. Some items may interact with your medicine. What should I watch for while using this medication? Your condition will be monitored carefully while you are receiving this medication. You may need blood work while taking this medication. This medication may make you feel generally unwell. This is not uncommon as chemotherapy can affect healthy cells   as well as cancer cells. Report any side effects. Continue your course of treatment even though you feel ill unless your care team tells you to stop. This medication can cause serious allergic reactions. To reduce the risk, your care team may give you other medications to take before receiving this one. Be sure to follow the directions from your care team. This medication may increase your risk of getting an infection. Call your care team for advice if you get a fever, chills, sore throat, or other symptoms of a cold or flu. Do not treat yourself. Try to avoid being around people who are sick. This medication may increase your risk to bruise or bleed. Call your care team if you notice any unusual  bleeding. Be careful brushing or flossing your teeth or using a toothpick because you may get an infection or bleed more easily. If you have any dental work done, tell your dentist you are receiving this medication. Talk to your care team if you may be pregnant. Serious birth defects can occur if you take this medication during pregnancy. Talk to your care team before breastfeeding. Changes to your treatment plan may be needed. What side effects may I notice from receiving this medication? Side effects that you should report to your care team as soon as possible: Allergic reactions--skin rash, itching, hives, swelling of the face, lips, tongue, or throat Heart rhythm changes--fast or irregular heartbeat, dizziness, feeling faint or lightheaded, chest pain, trouble breathing Increase in blood pressure Infection--fever, chills, cough, sore throat, wounds that don't heal, pain or trouble when passing urine, general feeling of discomfort or being unwell Low blood pressure--dizziness, feeling faint or lightheaded, blurry vision Low red blood cell level--unusual weakness or fatigue, dizziness, headache, trouble breathing Painful swelling, warmth, or redness of the skin, blisters or sores at the infusion site Pain, tingling, or numbness in the hands or feet Slow heartbeat--dizziness, feeling faint or lightheaded, confusion, trouble breathing, unusual weakness or fatigue Unusual bruising or bleeding Side effects that usually do not require medical attention (report to your care team if they continue or are bothersome): Diarrhea Hair loss Joint pain Loss of appetite Muscle pain Nausea Vomiting This list may not describe all possible side effects. Call your doctor for medical advice about side effects. You may report side effects to FDA at 1-800-FDA-1088. Where should I keep my medication? This medication is given in a hospital or clinic. It will not be stored at home. NOTE: This sheet is a summary.  It may not cover all possible information. If you have questions about this medicine, talk to your doctor, pharmacist, or health care provider.  2023 Elsevier/Gold Standard (2021-10-20 00:00:00)  Carboplatin Injection What is this medication? CARBOPLATIN (KAR boe pla tin) treats some types of cancer. It works by slowing down the growth of cancer cells. This medicine may be used for other purposes; ask your health care provider or pharmacist if you have questions. COMMON BRAND NAME(S): Paraplatin What should I tell my care team before I take this medication? They need to know if you have any of these conditions: Blood disorders Hearing problems Kidney disease Recent or ongoing radiation therapy An unusual or allergic reaction to carboplatin, cisplatin, other medications, foods, dyes, or preservatives Pregnant or trying to get pregnant Breast-feeding How should I use this medication? This medication is injected into a vein. It is given by your care team in a hospital or clinic setting. Talk to your care team about the use of this medication  in children. Special care may be needed. Overdosage: If you think you have taken too much of this medicine contact a poison control center or emergency room at once. NOTE: This medicine is only for you. Do not share this medicine with others. What if I miss a dose? Keep appointments for follow-up doses. It is important not to miss your dose. Call your care team if you are unable to keep an appointment. What may interact with this medication? Medications for seizures Some antibiotics, such as amikacin, gentamicin, neomycin, streptomycin, tobramycin Vaccines This list may not describe all possible interactions. Give your health care provider a list of all the medicines, herbs, non-prescription drugs, or dietary supplements you use. Also tell them if you smoke, drink alcohol, or use illegal drugs. Some items may interact with your medicine. What should I  watch for while using this medication? Your condition will be monitored carefully while you are receiving this medication. You may need blood work while taking this medication. This medication may make you feel generally unwell. This is not uncommon, as chemotherapy can affect healthy cells as well as cancer cells. Report any side effects. Continue your course of treatment even though you feel ill unless your care team tells you to stop. In some cases, you may be given additional medications to help with side effects. Follow all directions for their use. This medication may increase your risk of getting an infection. Call your care team for advice if you get a fever, chills, sore throat, or other symptoms of a cold or flu. Do not treat yourself. Try to avoid being around people who are sick. Avoid taking medications that contain aspirin, acetaminophen, ibuprofen, naproxen, or ketoprofen unless instructed by your care team. These medications may hide a fever. Be careful brushing or flossing your teeth or using a toothpick because you may get an infection or bleed more easily. If you have any dental work done, tell your dentist you are receiving this medication. Talk to your care team if you wish to become pregnant or think you might be pregnant. This medication can cause serious birth defects. Talk to your care team about effective forms of contraception. Do not breast-feed while taking this medication. What side effects may I notice from receiving this medication? Side effects that you should report to your care team as soon as possible: Allergic reactions--skin rash, itching, hives, swelling of the face, lips, tongue, or throat Infection--fever, chills, cough, sore throat, wounds that don't heal, pain or trouble when passing urine, general feeling of discomfort or being unwell Low red blood cell level--unusual weakness or fatigue, dizziness, headache, trouble breathing Pain, tingling, or numbness in  the hands or feet, muscle weakness, change in vision, confusion or trouble speaking, loss of balance or coordination, trouble walking, seizures Unusual bruising or bleeding Side effects that usually do not require medical attention (report to your care team if they continue or are bothersome): Hair loss Nausea Unusual weakness or fatigue Vomiting This list may not describe all possible side effects. Call your doctor for medical advice about side effects. You may report side effects to FDA at 1-800-FDA-1088. Where should I keep my medication? This medication is given in a hospital or clinic. It will not be stored at home. NOTE: This sheet is a summary. It may not cover all possible information. If you have questions about this medicine, talk to your doctor, pharmacist, or health care provider.  2023 Elsevier/Gold Standard (2021-09-27 00:00:00)

## 2022-02-21 NOTE — Progress Notes (Signed)
[facility] CSW Progress Note  Holiday representative met with patient to gather and submit [employer] fast streamer for [place] application. Provided supportive counseling around diagnosis and treatment. Pt is planning to do a trip to [place] when she is done to explore [place] treatments. She is interested in trying Reiki here and in [place] as well. CSW provided contact information for registering for classes and signed pt up for tai chi.      Tiffany Bruski E Daphne [facility], LCSW

## 2022-02-21 NOTE — Progress Notes (Signed)
[place] OFFICE PROGRESS NOTE  Patient Care Team: Lawerance Cruel, MD as PCP - General (Family Medicine)  ASSESSMENT & PLAN:  Uterine cancer ([place]) Overall, I felt that she is clinically stable Her vaginal discharge has resolved She has very mild peripheral neuropathy due to treatment but not symptomatic Her blood counts are somewhat stable I recommend we proceed with treatment without delay After cycle 3 of therapy, I plan to repeat imaging study for objective assessment of response to treatment  Postmenopausal bleeding She have minor postmenopausal bleeding but resolving She is mildly anemic but not symptomatic Observe for now  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, [name], I will consider modifying the dose of the treatment   No orders of the defined types were placed in this encounter.   All questions were answered. The patient knows to call the clinic with any problems, questions or concerns. The total time spent in the appointment was 30 minutes encounter with patients including review of chart and various tests results, discussions about plan of care and coordination of care plan   [name], MD 02/21/2022 9:54 AM  INTERVAL HISTORY: Please see below for problem oriented charting. she returns for treatment follow-up seen prior to cycle 2 of therapy She had very mild peripheral neuropathy affecting the tips of her fingers She is attempting further cryotherapy She denies recent nausea or changes in bowel habits Her vaginal discharge has resolved but she has very mild spotting Overall, her energy  level is fair  REVIEW OF SYSTEMS:   Constitutional: Denies fevers, [name], nose, mouth, throat, [name], [name], chest discomfort or lower extremity swelling Gastrointestinal:  Denies nausea, heartburn or change in bowel habits Skin: Denies abnormal skin rashes Lymphatics: Denies new lymphadenopathy or easy bruising Neurological:Denies numbness, tingling or new weaknesses Behavioral/Psych: Mood is stable, no new changes  All other systems were reviewed with the patient and are negative.  I have reviewed the past medical history, past surgical history, social history and family history with the patient and they are unchanged from previous note.  ALLERGIES:  is allergic to seasonal ic [octacosanol] and latex.  MEDICATIONS:  Current Outpatient Medications  Medication Sig Dispense Refill   ALPRAZolam (XANAX) 0.25 MG tablet Take 0.25 mg by mouth every 8 (eight) hours as needed for anxiety.     dexamethasone (DECADRON) 4 MG tablet Take 2 tablets by mouth the night before and 2 tabs the morning of chemotherapy, every 3 weeks, for 6 cycles 36 tablet 6   mirtazapine (REMERON) 15 MG tablet Take 1 tablet (15 mg total) by mouth at bedtime. 30 tablet 3   oxyCODONE (OXY IR/ROXICODONE) 5 MG immediate release tablet Take 1 tablet (5 mg total) by mouth every 4 (four) hours as needed for severe pain. 30 tablet 0   No current facility-administered medications for this visit.   Facility-Administered Medications Ordered in Other Visits  Medication Dose Route Frequency Provider Last Rate Last Admin   CARBOplatin (PARAPLATIN) 470 mg in sodium chloride 0.9 %  250 mL chemo infusion  470 mg Intravenous Once [name], MD       cetirizine (QUZYTTIR) injection 10 mg  10 mg Intravenous Once Alvy Bimler, Brielyn Bosak, MD       dexamethasone (DECADRON) 10 mg  in sodium chloride 0.9 % 50 mL IVPB  10 mg Intravenous Once Alvy Bimler, Tonisha Silvey, MD       dostarlimab-gxly (JEMPERLI) 500 mg in sodium chloride 0.9 % 100 mL (4.5455 mg/mL) chemo infusion  500 mg Intravenous Once Alvy Bimler, Aleene Swanner, MD       famotidine (PEPCID) IVPB 20 mg premix  20 mg Intravenous Once Alvy Bimler, Edon Hoadley, MD       fosaprepitant (EMEND) 150 mg in sodium chloride 0.9 % 145 mL IVPB  150 mg Intravenous Once Alvy Bimler, Arjay Jaskiewicz, MD       heparin lock flush 100 unit/mL  500 Units Intracatheter Once PRN Alvy Bimler, Edom Schmuhl, MD       PACLitaxel (TAXOL) 282 mg in sodium chloride 0.9 % 250 mL chemo infusion (> 41m/m2)  175 mg/m2 (Treatment Plan Recorded) Intravenous Once Massai Hankerson, MD       sodium chloride flush (NS) 0.9 % injection 10 mL  10 mL Intracatheter PRN GAlvy Bimler Velora Horstman, MD        SUMMARY OF ONCOLOGIC HISTORY: Oncology History Overview Note  HER2 negative   Uterine cancer ([place]  12/03/2021 Initial Diagnosis   Patient reports several month history of intermittent pelvic pain that she describes as discomfort or sometimes cramping, that has worsened with time.  She notes that this is tolerable.  She began having postmenopausal bleeding started on 6/17 with a few spots of blood.     12/29/2021 Imaging   1. Bilateral large multiloculated cystic lesions of the ovaries, concerning for ovarian malignancy. Recommend gynecologic consultation and pelvic ultrasound and/or pelvic MRI with contrast for further evaluation. 2. Large heterogeneous mass of the posterior pelvis likely arising from the lower uterus, possibly a fibroid, although malignancy is also a concern. Recommend attention on pelvic ultrasound or MRI as above. 3. Additional mass arising from the right uterine fundus which correlates with fibroid described on prior 2015 ultrasound. 4. Nonspecific small ground-glass nodule of the right lower lobe measuring 6 mm. Recommend follow-up chest CT in 6 months for further evaluation. 5. Aortic Atherosclerosis (ICD10-I70.0).    12/30/2021 Pathology Results   A. CERVIX, BIOPSY:  -  Scantly cellular specimen with predominantly blood, fibrin and acute inflammatory infiltrate but with scattered highly atypical cells highly suspicious for malignancy.   Note: A p16 shows strong positivity in these scattered cells; however, while p53 is mildly increased definitive strong overexpression or under expression is not identified.  These findings support the suspicion for malignancy; however, are insufficient for a definitive diagnosis.     01/03/2022 Imaging   MRI pelvis  1. Large mixed solid and cystic lesions of the bilateral ovaries, measuring 8.4 x 7.0 x 5.4 cm on the right and 10.3 x 8.7 x 8.1 cm on the left, highly concerning for primary ovarian malignancy and contralateral ovarian metastasis.   2. Homogeneously enhancing mass arising from the right aspect of the uterine body and fundus measuring 6.0 x 5.5 x 4.3 cm, consistent with a uterine fibroid and similar to prior ultrasound dated 2015.   3. An additional, much more heterogeneous mass which appears to arise from the cervix or posterior lower uterine segment measuring 8.1 x 6.4 x 6.2 cm. This may reflect an additional fibroid with internal degeneration, however this was not clearly  visualized on ultrasound dated 2015 and is highly suspicious for a cervical mass as significant enlargement of benign fibroids is not expected following menopause. This mass appears to closely abut and compress or perhaps even directly involve the adjacent rectum.   4.  No evidence of lymphadenopathy in the pelvis.   01/12/2022 PET scan   1. Hypermetabolic heterogeneous mass centered on the cervix/lower uterine segment is most consistent with primary gynecologic neoplasm. 2. Multiloculated complex bilateral adnexal cystic lesions with hypermetabolic soft tissue component likely reflect metastatic lesions. 3. No hypermetabolic abdominopelvic adenopathy. 4. No evidence of hypermetabolic  metastatic disease in the chest or abdomen.   01/18/2022 Pathology Results   FINAL MICROSCOPIC DIAGNOSIS:   A. CERVICAL MASS, BIOPSIES:  - Poorly differentiated carcinoma with necrosis, consistent with high-grade serous carcinoma, see comment   B. CERVICAL MASS, EXCISION:  - Poorly differentiated carcinoma with necrosis, consistent with high-grade serous carcinoma see comment  - Edges of resected tissue fragments are positive for carcinoma   COMMENT:   A and B.   The tumor cells are positive for PAX8, ER and p16 immunostains.  Immunostain for p53 shows significant overexpression. Immunostain for CK5/6 shows weak focal labeling while immunostain for  p63 is negative.  The immunoprofile is consistent with above interpretation.  The carcinoma is likely endometrial or ovarian origin with secondary involvement of the cervix.     01/23/2022 Initial Diagnosis   Uterine cancer ([place])   01/23/2022 Cancer Staging   Staging form: Corpus Uteri - Carcinoma and Carcinosarcoma, AJCC 8th Edition - Clinical stage from 01/23/2022: FIGO Stage IIIA (cT3a, cN0, cM0) - Signed by [name], MD on 01/23/2022 Stage prefix: Initial diagnosis   01/31/2022 - 01/31/2022 Chemotherapy   Patient is on Treatment Plan : UTERINE ENDOMETRIAL Dostarlimab-gxly (500 mg) + Carboplatin (AUC 5) + Paclitaxel (175 mg/m2) q21d x 6 cycles / Dostarlimab-gxly (1000 mg) q42d x 6 cycles      01/31/2022 -  Chemotherapy   Patient is on Treatment Plan : UTERINE ENDOMETRIAL Dostarlimab-gxly (500 mg) + Carboplatin (AUC 5) + Paclitaxel (175 mg/m2) q21d x 6 cycles / Dostarlimab-gxly (1000 mg) q42d x 6 cycles        PHYSICAL EXAMINATION: ECOG PERFORMANCE STATUS: 1 - Symptomatic but completely ambulatory  Vitals:   02/21/22 0905  BP: (!) 144/90  Pulse: (!) 108  Resp: 16  Temp: 97.9 F (36.6 C)  SpO2: 98%   Filed Weights   02/21/22 0905  Weight: 125 lb 12.8 oz (57.1 kg)    GENERAL:alert, no distress and comfortable SKIN: skin color,  texture, turgor are normal, no rashes or significant lesions EYES: normal, Conjunctiva are pink and non-injected, sclera clear OROPHARYNX:no exudate, no erythema and lips, buccal mucosa, and tongue normal  NECK: supple, thyroid normal size, non-tender, without nodularity LYMPH:  no palpable lymphadenopathy in the cervical, axillary or inguinal LUNGS: clear to auscultation and percussion with normal breathing effort HEART: regular rate & rhythm and no murmurs and no lower extremity edema ABDOMEN:abdomen soft, non-tender and normal bowel sounds Musculoskeletal:no cyanosis of digits and no clubbing  NEURO: alert & oriented x 3 with fluent speech, no focal motor/sensory deficits  LABORATORY DATA:  I have reviewed the data as listed    Component Value Date/Time   NA 137 02/21/2022 0848   K 4.0 02/21/2022 0848   CL 105 02/21/2022 0848   CO2 23 02/21/2022 0848   GLUCOSE 173 (H) 02/21/2022 0848   BUN 16 02/21/2022 0848   CREATININE 0.71 02/21/2022  0848   CALCIUM 9.5 02/21/2022 0848   PROT 7.1 02/21/2022 0848   ALBUMIN 4.2 02/21/2022 0848   AST 13 (L) 02/21/2022 0848   ALT 15 02/21/2022 0848   ALKPHOS 80 02/21/2022 0848   BILITOT 0.3 02/21/2022 0848   GFRNONAA >60 02/21/2022 0848    No results found for: "SPEP", "UPEP"  Lab Results  Component Value Date   WBC 12.5 (H) 02/21/2022   NEUTROABS 10.0 (H) 02/21/2022   HGB 11.9 (L) 02/21/2022   HCT 35.0 (L) 02/21/2022   MCV 72.3 (L) 02/21/2022   PLT 246 02/21/2022      Chemistry      Component Value Date/Time   NA 137 02/21/2022 0848   K 4.0 02/21/2022 0848   CL 105 02/21/2022 0848   CO2 23 02/21/2022 0848   BUN 16 02/21/2022 0848   CREATININE 0.71 02/21/2022 0848      Component Value Date/Time   CALCIUM 9.5 02/21/2022 0848   ALKPHOS 80 02/21/2022 0848   AST 13 (L) 02/21/2022 0848   ALT 15 02/21/2022 0848   BILITOT 0.3 02/21/2022 0848

## 2022-02-21 NOTE — Progress Notes (Signed)
Per Dr. Alvy Bimler ok to proceed with treatment with elevated HR.

## 2022-02-21 NOTE — Assessment & Plan Note (Signed)
Overall, I felt that she is clinically stable Her vaginal discharge has resolved She has very mild peripheral neuropathy due to treatment but not symptomatic Her blood counts are somewhat stable I recommend we proceed with treatment without delay After cycle 3 of therapy, I plan to repeat imaging study for objective assessment of response to treatment

## 2022-02-22 ENCOUNTER — Other Ambulatory Visit: Payer: Self-pay

## 2022-03-01 ENCOUNTER — Other Ambulatory Visit: Payer: Self-pay

## 2022-03-09 ENCOUNTER — Other Ambulatory Visit (HOSPITAL_COMMUNITY): Payer: Self-pay

## 2022-03-09 ENCOUNTER — Other Ambulatory Visit: Payer: Self-pay

## 2022-03-13 ENCOUNTER — Telehealth: Payer: Self-pay

## 2022-03-13 ENCOUNTER — Encounter: Payer: Self-pay | Admitting: Hematology and Oncology

## 2022-03-13 MED FILL — Dexamethasone Sodium Phosphate Inj 100 MG/10ML: INTRAMUSCULAR | Qty: 1 | Status: AC

## 2022-03-13 MED FILL — Fosaprepitant Dimeglumine For IV Infusion 150 MG (Base Eq): INTRAVENOUS | Qty: 5 | Status: AC

## 2022-03-13 NOTE — Telephone Encounter (Signed)
Pt called reporting signs of URI. Covid test negative. Patient has appointments on 03/14/22. OK for pt to proceed with appointments. Pt instructed/encouraged to wear a mask while at [facility].

## 2022-03-14 ENCOUNTER — Inpatient Hospital Stay: Payer: No Typology Code available for payment source

## 2022-03-14 ENCOUNTER — Inpatient Hospital Stay: Payer: No Typology Code available for payment source | Admitting: Dietician

## 2022-03-14 ENCOUNTER — Other Ambulatory Visit: Payer: Self-pay

## 2022-03-14 ENCOUNTER — Inpatient Hospital Stay (HOSPITAL_BASED_OUTPATIENT_CLINIC_OR_DEPARTMENT_OTHER): Payer: No Typology Code available for payment source | Admitting: Hematology and Oncology

## 2022-03-14 ENCOUNTER — Other Ambulatory Visit: Payer: Self-pay | Admitting: Hematology and Oncology

## 2022-03-14 ENCOUNTER — Inpatient Hospital Stay: Payer: No Typology Code available for payment source | Admitting: Licensed Clinical Social Worker

## 2022-03-14 ENCOUNTER — Encounter: Payer: Self-pay | Admitting: Hematology and Oncology

## 2022-03-14 VITALS — BP 151/87 | HR 86 | Temp 97.6°F | Resp 18

## 2022-03-14 VITALS — BP 147/81 | HR 71 | Temp 97.7°F | Resp 18 | Ht 63.0 in | Wt 130.8 lb

## 2022-03-14 DIAGNOSIS — F419 Anxiety disorder, unspecified: Secondary | ICD-10-CM | POA: Diagnosis not present

## 2022-03-14 DIAGNOSIS — G620 Drug-induced polyneuropathy: Secondary | ICD-10-CM | POA: Diagnosis not present

## 2022-03-14 DIAGNOSIS — R739 Hyperglycemia, unspecified: Secondary | ICD-10-CM

## 2022-03-14 DIAGNOSIS — C539 Malignant neoplasm of cervix uteri, unspecified: Secondary | ICD-10-CM

## 2022-03-14 DIAGNOSIS — R64 Cachexia: Secondary | ICD-10-CM

## 2022-03-14 DIAGNOSIS — T50905A Adverse effect of unspecified drugs, medicaments and biological substances, initial encounter: Secondary | ICD-10-CM

## 2022-03-14 DIAGNOSIS — Z5112 Encounter for antineoplastic immunotherapy: Secondary | ICD-10-CM | POA: Diagnosis not present

## 2022-03-14 DIAGNOSIS — T451X5A Adverse effect of antineoplastic and immunosuppressive drugs, initial encounter: Secondary | ICD-10-CM

## 2022-03-14 LAB — CBC WITH DIFFERENTIAL (CANCER CENTER ONLY)
Abs Immature Granulocytes: 0.05 10*3/uL (ref 0.00–0.07)
Basophils Absolute: 0 10*3/uL (ref 0.0–0.1)
Basophils Relative: 0 %
Eosinophils Absolute: 0 10*3/uL (ref 0.0–0.5)
Eosinophils Relative: 0 %
HCT: 35.3 % — ABNORMAL LOW (ref 36.0–46.0)
Hemoglobin: 11.8 g/dL — ABNORMAL LOW (ref 12.0–15.0)
Immature Granulocytes: 1 %
Lymphocytes Relative: 15 %
Lymphs Abs: 1.3 10*3/uL (ref 0.7–4.0)
MCH: 25.1 pg — ABNORMAL LOW (ref 26.0–34.0)
MCHC: 33.4 g/dL (ref 30.0–36.0)
MCV: 74.9 fL — ABNORMAL LOW (ref 80.0–100.0)
Monocytes Absolute: 0.1 10*3/uL (ref 0.1–1.0)
Monocytes Relative: 2 %
Neutro Abs: 7.1 10*3/uL (ref 1.7–7.7)
Neutrophils Relative %: 82 %
Platelet Count: 179 10*3/uL (ref 150–400)
RBC: 4.71 MIL/uL (ref 3.87–5.11)
RDW: 15.4 % (ref 11.5–15.5)
WBC Count: 8.5 10*3/uL (ref 4.0–10.5)
nRBC: 0 % (ref 0.0–0.2)

## 2022-03-14 LAB — CMP (CANCER CENTER ONLY)
ALT: 20 U/L (ref 0–44)
AST: 16 U/L (ref 15–41)
Albumin: 4.2 g/dL (ref 3.5–5.0)
Alkaline Phosphatase: 66 U/L (ref 38–126)
Anion gap: 12 (ref 5–15)
BUN: 21 mg/dL — ABNORMAL HIGH (ref 6–20)
CO2: 22 mmol/L (ref 22–32)
Calcium: 9.2 mg/dL (ref 8.9–10.3)
Chloride: 104 mmol/L (ref 98–111)
Creatinine: 0.82 mg/dL (ref 0.44–1.00)
GFR, Estimated: >60 mL/min (ref >60)
Glucose, Bld: 428 mg/dL — ABNORMAL HIGH (ref 70–99)
Potassium: 4.1 mmol/L (ref 3.5–5.1)
Sodium: 138 mmol/L (ref 135–145)
Total Bilirubin: 0.2 mg/dL — ABNORMAL LOW (ref 0.3–1.2)
Total Protein: 7.2 g/dL (ref 6.5–8.1)

## 2022-03-14 LAB — TSH: TSH: 0.376 u[IU]/mL (ref 0.350–4.500)

## 2022-03-14 MED ORDER — SODIUM CHLORIDE 0.9% FLUSH
10.0000 mL | Freq: Once | INTRAVENOUS | Status: AC
  Administered 2022-03-14: 10 mL

## 2022-03-14 MED ORDER — SODIUM CHLORIDE 0.9 % IV SOLN
465.0000 mg | Freq: Once | INTRAVENOUS | Status: AC
  Administered 2022-03-14: 470 mg via INTRAVENOUS
  Filled 2022-03-14: qty 47

## 2022-03-14 MED ORDER — SODIUM CHLORIDE 0.9 % IV SOLN
500.0000 mg | Freq: Once | INTRAVENOUS | Status: AC
  Administered 2022-03-14: 500 mg via INTRAVENOUS
  Filled 2022-03-14: qty 10

## 2022-03-14 MED ORDER — HEPARIN SOD (PORK) LOCK FLUSH 100 UNIT/ML IV SOLN
500.0000 [IU] | Freq: Once | INTRAVENOUS | Status: AC | PRN
  Administered 2022-03-14: 500 [IU]

## 2022-03-14 MED ORDER — PALONOSETRON HCL INJECTION 0.25 MG/5ML
0.2500 mg | Freq: Once | INTRAVENOUS | Status: AC
  Administered 2022-03-14: 0.25 mg via INTRAVENOUS
  Filled 2022-03-14: qty 5

## 2022-03-14 MED ORDER — SODIUM CHLORIDE 0.9 % IV SOLN
10.0000 mg | Freq: Once | INTRAVENOUS | Status: AC
  Administered 2022-03-14: 10 mg via INTRAVENOUS
  Filled 2022-03-14: qty 10

## 2022-03-14 MED ORDER — INSULIN ASPART 100 UNIT/ML IJ SOLN
10.0000 [IU] | Freq: Once | INTRAMUSCULAR | Status: AC
  Administered 2022-03-14: 10 [IU] via SUBCUTANEOUS
  Filled 2022-03-14: qty 1

## 2022-03-14 MED ORDER — CETIRIZINE HCL 10 MG/ML IV SOLN
10.0000 mg | Freq: Once | INTRAVENOUS | Status: AC
  Administered 2022-03-14: 10 mg via INTRAVENOUS
  Filled 2022-03-14: qty 1

## 2022-03-14 MED ORDER — SODIUM CHLORIDE 0.9 % IV SOLN: Freq: Once | INTRAVENOUS | Status: AC

## 2022-03-14 MED ORDER — SODIUM CHLORIDE 0.9% FLUSH
10.0000 mL | INTRAVENOUS | Status: DC | PRN
  Administered 2022-03-14: 10 mL

## 2022-03-14 MED ORDER — SODIUM CHLORIDE 0.9 % IV SOLN
150.0000 mg | Freq: Once | INTRAVENOUS | Status: AC
  Administered 2022-03-14: 150 mg via INTRAVENOUS
  Filled 2022-03-14: qty 150

## 2022-03-14 MED ORDER — FAMOTIDINE IN NACL 20-0.9 MG/50ML-% IV SOLN
20.0000 mg | Freq: Once | INTRAVENOUS | Status: AC
  Administered 2022-03-14: 20 mg via INTRAVENOUS
  Filled 2022-03-14: qty 50

## 2022-03-14 MED ORDER — SODIUM CHLORIDE 0.9 % IV SOLN
175.0000 mg/m2 | Freq: Once | INTRAVENOUS | Status: AC
  Administered 2022-03-14: 282 mg via INTRAVENOUS
  Filled 2022-03-14: qty 47

## 2022-03-14 NOTE — Assessment & Plan Note (Signed)
Overall, I felt that she is clinically stable Her vaginal discharge has resolved She has very mild peripheral neuropathy due to treatment but not symptomatic Her blood counts are somewhat stable I recommend we proceed with treatment without delay I have reviewed plan of care with the patient and discussed the timing of imaging study with GYN surgeon Plan to repeat PET/CT imaging on October 27 We discussed the role of tumor marker monitoring and I do not plan to order CA-125

## 2022-03-14 NOTE — Assessment & Plan Note (Signed)
she has mild peripheral neuropathy, [name], [name], I will consider modifying the dose of the treatment  

## 2022-03-14 NOTE — Patient Instructions (Signed)
[facility]  Discharge Instructions: Thank you for choosing [facility] to provide your oncology and hematology care.   If you have a lab appointment with the [facility], please go directly to the [facility] and check in at the registration area.   Wear comfortable clothing and clothing appropriate for easy access to any Portacath or PICC line.   We strive to give you quality time with your provider. You may need to reschedule your appointment if you arrive late (15 or more minutes).  Arriving late affects you and other patients whose appointments are after yours.  Also, if you miss three or more appointments without notifying the office, you may be dismissed from the clinic at the provider's discretion.      For prescription refill requests, have your pharmacy contact our office and allow 72 hours for refills to be completed.    Today you received the following chemotherapy and/or immunotherapy agents: Jemperli, Paclitaxel, Carboplatin.   To help prevent nausea and vomiting after your treatment, we encourage you to take your nausea medication as directed.  BELOW ARE SYMPTOMS THAT SHOULD BE REPORTED IMMEDIATELY: *FEVER GREATER THAN 100.4 F (38 C) OR HIGHER *CHILLS OR SWEATING *NAUSEA AND VOMITING THAT IS NOT CONTROLLED WITH YOUR NAUSEA MEDICATION *UNUSUAL SHORTNESS OF BREATH *UNUSUAL BRUISING OR BLEEDING *URINARY PROBLEMS (pain or burning when urinating, or frequent urination) *BOWEL PROBLEMS (unusual diarrhea, constipation, pain near the anus) TENDERNESS IN MOUTH AND THROAT WITH OR WITHOUT PRESENCE OF ULCERS (sore throat, sores in mouth, or a toothache) UNUSUAL RASH, SWELLING OR PAIN  UNUSUAL VAGINAL DISCHARGE OR ITCHING   Items with * indicate a potential emergency and should be followed up as soon as possible or go to the Emergency Department if any problems should occur.  Please show the CHEMOTHERAPY ALERT CARD or IMMUNOTHERAPY  ALERT CARD at check-in to the Emergency Department and triage nurse.  Should you have questions after your visit or need to cancel or reschedule your appointment, please contact [facility]  Dept: [phone]  and follow the prompts.  Office hours are 8:00 a.m. to 4:30 p.m. Monday - Friday. Please note that voicemails left after 4:00 p.m. may not be returned until the following business day.  We are closed weekends and major holidays. You have access to a nurse at all times for urgent questions. Please call the main number to the clinic Dept: [phone] and follow the prompts.   For any non-urgent questions, you may also contact your provider using MyChart. We now offer e-Visits for anyone 28 and older to request care online for non-urgent symptoms. For details visit [email or web address].   Also download the MyChart app! Go to the app store, search "MyChart", open the app, select [facility], and log in with your MyChart username and password.  Masks are optional in the cancer centers. If you would like for your care team to wear a mask while they are taking care of you, please let them know. You may have one support person who is at least 60 years old accompany you for your appointments.  Dostarlimab Injection What is this medication? DOSTARLIMAB (dos tar li mab) treats some types of cancer. It works by helping your immune system slow or stop the spread of cancer cells. It is a monoclonal antibody. This medicine may be used for other purposes; ask your health care provider or pharmacist if you have questions. COMMON BRAND NAME(S): Jemperli What should I  tell my care team before I take this medication? They need to know if you have any of these conditions: Allogeneic stem cell transplant (uses someone else's stem cells) Autoimmune diseases, such as Crohn disease, ulcerative colitis, lupus History of chest radiation Nervous system problems, such as  Guillain-Barre syndrome, myasthenia gravis Organ transplant An unusual or allergic reaction to dostarlimab, other medications, foods, dyes, or preservatives Pregnant or trying to get pregnant Breast-feeding How should I use this medication? This medication is injected into a vein. It is given by your care team in a hospital or clinic setting. A special MedGuide will be given to you before each treatment. Be sure to read this information carefully each time. Talk to your care team about the use of this medication in children. Special care may be needed. Overdosage: If you think you have taken too much of this medicine contact a poison control center or emergency room at once. NOTE: This medicine is only for you. Do not share this medicine with others. What if I miss a dose? Keep appointments for follow-up doses. It is important not to miss your dose. Call your care team if you are unable to keep an appointment. What may interact with this medication? Interactions have not been studied. This list may not describe all possible interactions. Give your health care provider a list of all the medicines, herbs, non-prescription drugs, or dietary supplements you use. Also tell them if you smoke, drink alcohol, or use illegal drugs. Some items may interact with your medicine. What should I watch for while using this medication? Your condition will be monitored carefully while you are receiving this medication. You may need blood work while taking this medication. This medication may cause serious skin reactions. They can happen weeks to months after starting the medication. Contact your care team right away if you notice fevers or flu-like symptoms with a rash. The rash may be red or purple and then turn into blisters or peeling of the skin. You may also notice a red rash with swelling of the face, lips, or lymph nodes in your neck or under your arms. Tell your care team right away if you have any change in  your eyesight. Talk to your care team if you may be pregnant. Serious birth defects can occur if you take this medication during pregnancy and for 4 months after the last dose. You will need a negative pregnancy test before starting this medication. Contraception is recommended while taking this medication and for 4 months after the last dose. Your care team can help you find the option that works for you. Do not breastfeed while taking this medication and for 4 months after the last dose. What side effects may I notice from receiving this medication? Side effects that you should report to your care team as soon as possible: Allergic reactions--skin rash, itching, hives, swelling of the face, lips, tongue, or throat Dry cough, shortness of breath or trouble breathing Eye pain, redness, irritation, or discharge with blurry or decreased vision Heart muscle inflammation--unusual weakness or fatigue, shortness of breath, chest pain, fast or irregular heartbeat, dizziness, swelling of the ankles, feet, or hands Hormone gland problems--headache, sensitivity to light, unusual weakness or fatigue, dizziness, fast or irregular heartbeat, increased sensitivity to cold or heat, excessive sweating, constipation, hair loss, increased thirst or amount of urine, tremors or shaking, irritability Infusion reactions--chest pain, shortness of breath or trouble breathing, feeling faint or lightheaded Kidney injury (glomerulonephritis)--decrease in the amount of urine,  red or dark brown urine, foamy or bubbly urine, swelling of the ankles, hands, or feet Liver injury--right upper belly pain, loss of appetite, nausea, light-colored stool, dark yellow or brown urine, yellowing skin or eyes, unusual weakness or fatigue Pain, tingling, or numbness in the hands or feet, muscle weakness, change in vision, confusion or trouble speaking, loss of balance or coordination, trouble walking, seizures Rash, fever, and swollen lymph  nodes Redness, blistering, peeling, or loosening of the skin, including inside the mouth Sudden or severe stomach pain, bloody diarrhea, fever, nausea, vomiting Side effects that usually do not require medical attention (report these to your care team if they continue or are bothersome): Bone, joint, or muscle pain Diarrhea Fatigue Loss of appetite Nausea Skin rash This list may not describe all possible side effects. Call your doctor for medical advice about side effects. You may report side effects to FDA at 1-800-FDA-1088. Where should I keep my medication? This medication is given in a hospital or clinic. It will not be stored at home. NOTE: This sheet is a summary. It may not cover all possible information. If you have questions about this medicine, talk to your doctor, pharmacist, or health care provider.  2023 Elsevier/Gold Standard (2021-10-18 00:00:00)  Paclitaxel Injection What is this medication? PACLITAXEL (PAK li TAX el) treats some types of cancer. It works by slowing down the growth of cancer cells. This medicine may be used for other purposes; ask your health care provider or pharmacist if you have questions. COMMON BRAND NAME(S): Onxol, Taxol What should I tell my care team before I take this medication? They need to know if you have any of these conditions: Heart disease Liver disease Low white blood cell levels An unusual or allergic reaction to paclitaxel, other medications, foods, dyes, or preservatives If you or your partner are pregnant or trying to get pregnant Breast-feeding How should I use this medication? This medication is injected into a vein. It is given by your care team in a hospital or clinic setting. Talk to your care team about the use of this medication in children. While it may be given to children for selected conditions, precautions do apply. Overdosage: If you think you have taken too much of this medicine contact a poison control center or  emergency room at once. NOTE: This medicine is only for you. Do not share this medicine with others. What if I miss a dose? Keep appointments for follow-up doses. It is important not to miss your dose. Call your care team if you are unable to keep an appointment. What may interact with this medication? Do not take this medication with any of the following: Live virus vaccines Other medications may affect the way this medication works. Talk with your care team about all of the medications you take. They may suggest changes to your treatment plan to lower the risk of side effects and to make sure your medications work as intended. This list may not describe all possible interactions. Give your health care provider a list of all the medicines, herbs, non-prescription drugs, or dietary supplements you use. Also tell them if you smoke, drink alcohol, or use illegal drugs. Some items may interact with your medicine. What should I watch for while using this medication? Your condition will be monitored carefully while you are receiving this medication. You may need blood work while taking this medication. This medication may make you feel generally unwell. This is not uncommon as chemotherapy can affect healthy cells   as well as cancer cells. Report any side effects. Continue your course of treatment even though you feel ill unless your care team tells you to stop. This medication can cause serious allergic reactions. To reduce the risk, your care team may give you other medications to take before receiving this one. Be sure to follow the directions from your care team. This medication may increase your risk of getting an infection. Call your care team for advice if you get a fever, chills, sore throat, or other symptoms of a cold or flu. Do not treat yourself. Try to avoid being around people who are sick. This medication may increase your risk to bruise or bleed. Call your care team if you notice any unusual  bleeding. Be careful brushing or flossing your teeth or using a toothpick because you may get an infection or bleed more easily. If you have any dental work done, tell your dentist you are receiving this medication. Talk to your care team if you may be pregnant. Serious birth defects can occur if you take this medication during pregnancy. Talk to your care team before breastfeeding. Changes to your treatment plan may be needed. What side effects may I notice from receiving this medication? Side effects that you should report to your care team as soon as possible: Allergic reactions--skin rash, itching, hives, swelling of the face, lips, tongue, or throat Heart rhythm changes--fast or irregular heartbeat, dizziness, feeling faint or lightheaded, chest pain, trouble breathing Increase in blood pressure Infection--fever, chills, cough, sore throat, wounds that don't heal, pain or trouble when passing urine, general feeling of discomfort or being unwell Low blood pressure--dizziness, feeling faint or lightheaded, blurry vision Low red blood cell level--unusual weakness or fatigue, dizziness, headache, trouble breathing Painful swelling, warmth, or redness of the skin, blisters or sores at the infusion site Pain, tingling, or numbness in the hands or feet Slow heartbeat--dizziness, feeling faint or lightheaded, confusion, trouble breathing, unusual weakness or fatigue Unusual bruising or bleeding Side effects that usually do not require medical attention (report to your care team if they continue or are bothersome): Diarrhea Hair loss Joint pain Loss of appetite Muscle pain Nausea Vomiting This list may not describe all possible side effects. Call your doctor for medical advice about side effects. You may report side effects to FDA at 1-800-FDA-1088. Where should I keep my medication? This medication is given in a hospital or clinic. It will not be stored at home. NOTE: This sheet is a summary.  It may not cover all possible information. If you have questions about this medicine, talk to your doctor, pharmacist, or health care provider.  2023 Elsevier/Gold Standard (2021-10-20 00:00:00)  Carboplatin Injection What is this medication? CARBOPLATIN (KAR boe pla tin) treats some types of cancer. It works by slowing down the growth of cancer cells. This medicine may be used for other purposes; ask your health care provider or pharmacist if you have questions. COMMON BRAND NAME(S): Paraplatin What should I tell my care team before I take this medication? They need to know if you have any of these conditions: Blood disorders Hearing problems Kidney disease Recent or ongoing radiation therapy An unusual or allergic reaction to carboplatin, cisplatin, other medications, foods, dyes, or preservatives Pregnant or trying to get pregnant Breast-feeding How should I use this medication? This medication is injected into a vein. It is given by your care team in a hospital or clinic setting. Talk to your care team about the use of this medication  in children. Special care may be needed. Overdosage: If you think you have taken too much of this medicine contact a poison control center or emergency room at once. NOTE: This medicine is only for you. Do not share this medicine with others. What if I miss a dose? Keep appointments for follow-up doses. It is important not to miss your dose. Call your care team if you are unable to keep an appointment. What may interact with this medication? Medications for seizures Some antibiotics, such as amikacin, gentamicin, neomycin, streptomycin, tobramycin Vaccines This list may not describe all possible interactions. Give your health care provider a list of all the medicines, herbs, non-prescription drugs, or dietary supplements you use. Also tell them if you smoke, drink alcohol, or use illegal drugs. Some items may interact with your medicine. What should I  watch for while using this medication? Your condition will be monitored carefully while you are receiving this medication. You may need blood work while taking this medication. This medication may make you feel generally unwell. This is not uncommon, as chemotherapy can affect healthy cells as well as cancer cells. Report any side effects. Continue your course of treatment even though you feel ill unless your care team tells you to stop. In some cases, you may be given additional medications to help with side effects. Follow all directions for their use. This medication may increase your risk of getting an infection. Call your care team for advice if you get a fever, chills, sore throat, or other symptoms of a cold or flu. Do not treat yourself. Try to avoid being around people who are sick. Avoid taking medications that contain aspirin, acetaminophen, ibuprofen, naproxen, or ketoprofen unless instructed by your care team. These medications may hide a fever. Be careful brushing or flossing your teeth or using a toothpick because you may get an infection or bleed more easily. If you have any dental work done, tell your dentist you are receiving this medication. Talk to your care team if you wish to become pregnant or think you might be pregnant. This medication can cause serious birth defects. Talk to your care team about effective forms of contraception. Do not breast-feed while taking this medication. What side effects may I notice from receiving this medication? Side effects that you should report to your care team as soon as possible: Allergic reactions--skin rash, itching, hives, swelling of the face, lips, tongue, or throat Infection--fever, chills, cough, sore throat, wounds that don't heal, pain or trouble when passing urine, general feeling of discomfort or being unwell Low red blood cell level--unusual weakness or fatigue, dizziness, headache, trouble breathing Pain, tingling, or numbness in  the hands or feet, muscle weakness, change in vision, confusion or trouble speaking, loss of balance or coordination, trouble walking, seizures Unusual bruising or bleeding Side effects that usually do not require medical attention (report to your care team if they continue or are bothersome): Hair loss Nausea Unusual weakness or fatigue Vomiting This list may not describe all possible side effects. Call your doctor for medical advice about side effects. You may report side effects to FDA at 1-800-FDA-1088. Where should I keep my medication? This medication is given in a hospital or clinic. It will not be stored at home. NOTE: This sheet is a summary. It may not cover all possible information. If you have questions about this medicine, talk to your doctor, pharmacist, or health care provider.  2023 Elsevier/Gold Standard (2021-09-27 00:00:00)

## 2022-03-14 NOTE — Assessment & Plan Note (Signed)
She drank a lot of orange juice recently due to cold-like symptoms This is likely the cause of her uncontrolled hyperglycemia while on dexamethasone I plan to give her a dose of insulin today I recommend dietary changes in the future We discussed importance of hydration

## 2022-03-14 NOTE — Progress Notes (Signed)
[place] CSW Progress Note  Holiday representative met with patient in infusion. Pt's son, Lysbeth Galas, present for part of visit. Pt continues to process her feelings around diagnosis and treatment, including anger at cancer as she has always tried to be very healthy. Pt was able to identify ways to distract and ground herself with her grandson (33 months old) and gratitude for what is going well (support from family, medical team). Sleep is improved and she is no longer needing anxiety medication at night. Decreased symptoms since starting chemo has been helpful in feeling more control and progress.   Pt received small grant from [employer] and was approved for [employer] fast streamer for [facility]. She will follow-up with them on how they can assist.  CSW will see pt at next infusion.    Akashdeep Chuba E Aeron Lheureux, LCSW

## 2022-03-14 NOTE — Progress Notes (Signed)
Nutrition Assessment   Reason for Assessment: wt loss    ASSESSMENT: 61 year old female with uterine cancer. She is currently receiving carbo/taxol/jemperli q21d. Patient is under the care of Dr. Alvy Bimler  Past medical history includes hypothyroidism, peripheral neuropathy due to chemotherapy, anxiety, vit D deficiency, mixed hyperlipidemia.  Met with patient during infusion. She reports appetite has been decreased the last few days secondary to having a cold. Patient reports drinking 3-4 glasses of orange juice, waffle with syrup for breakfast this morning. She is asking if elevated glucose was because of this. Patient "avoids sugar" and processed foods. She is eating a variety of foods with good sources of protein (eggs, banana, cottage cheese, yogurt, almond butter, walnuts, lamb, rice) Patient is drinking Ensure a few times weekly. Patient reports ~60 ounces of water and a lot of green tea. Patient asking if coffee is okay to drink. She denies nausea, vomiting, diarrhea, constipation.    Nutrition Focused Physical Exam: deferred    Medications: oxycodone, xanax, decadron, remeron   Labs: glucose 428   Anthropometrics:   Height: 5'3" Weight: 130 lb 12.8 oz  UBW: 125-130 lb (per pt) BMI: 23.17   NUTRITION DIAGNOSIS: Food and nutrition related knowledge deficit related to cancer and associated treatments as evidenced by no prior need for related nutrition information.    INTERVENTION:  Encouraged small frequent meals and snacks vs 3 meals  Continue drinking Ensure Plus/equivalent as needed with decreased appetite - coupons provided Educated on foods with protein, encouraged including source with meals/snacks - handout with list provided Educated on all cells needing glucose for fuel and sugar does not "feed cancer" Contact information provided    MONITORING, EVALUATION, GOAL: Patient will tolerate increased calories and protein to minimize further weight loss during treatment     Next Visit: Tuesday October 17 during infusion

## 2022-03-14 NOTE — Progress Notes (Signed)
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 but not symptomatic Her blood counts are somewhat stable I recommend we proceed with treatment without delay I have reviewed plan of care with the patient and discussed the timing of imaging study with GYN surgeon Plan to repeat PET/CT imaging on October 27 We discussed the role of tumor marker monitoring and I do not plan to order CA-125  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, [name], I will consider modifying the dose of the treatment   Anxiety This is better controlled She felt that she does not need to be on antianxiety medicines  Drug-induced hyperglycemia She drank a lot of orange juice recently due to cold-like symptoms This is likely the cause of her uncontrolled hyperglycemia while on dexamethasone I plan to give her a dose of insulin today I recommend dietary changes in the future We discussed importance of hydration  Orders Placed This Encounter  Procedures   NM PET Image Restage (PS) Skull Base to Thigh (F-18 FDG)    Standing Status:   Future    Standing Expiration Date:   03/15/2023    Order Specific Question:   If indicated for the ordered procedure, I authorize the administration of a radiopharmaceutical per Radiology protocol    Answer:   Yes    Order Specific Question:   Preferred imaging location?    Answer:   [facility]    Order Specific Question:   Radiology  Contrast Protocol - do NOT remove file path    Answer:   [email or web address]    Order Specific Question:   Is the patient pregnant?    Answer:   No    All questions were answered. The patient knows to call the clinic with any problems, [name], discussions about plan of care and coordination of care plan   Tiffany Lark, MD 03/14/2022 11:20 AM  INTERVAL HISTORY: Please see below for problem oriented charting. she returns for treatment follow-up seen prior to cycle 3 of treatment She has minimal intermittent pelvic discomfort but denies recent vaginal bleeding or discharge She has very mild tingling sensation in her hands but not in the feet She has very mild nasal congestion but denies fever or chills  REVIEW OF SYSTEMS:   Constitutional: Denies fevers, [name], nose, mouth, throat, [name], [name], chest discomfort or lower extremity swelling Gastrointestinal:  Denies nausea, [name], no new changes  All other systems were reviewed with the patient and are negative.  I have reviewed the past medical history, past surgical history, social history and family history with the  patient and they are unchanged from previous note.  ALLERGIES:  is allergic to seasonal ic [octacosanol] and latex.  MEDICATIONS:  Current Outpatient Medications  Medication Sig Dispense Refill   ALPRAZolam (XANAX) 0.25 MG tablet Take 0.25 mg by mouth every 8 (eight) hours as needed for anxiety.     dexamethasone  (DECADRON) 4 MG tablet Take 2 tablets by mouth the night before and 2 tabs the morning of chemotherapy, every 3 weeks, for 6 cycles 36 tablet 6   oxyCODONE (OXY IR/ROXICODONE) 5 MG immediate release tablet Take 1 tablet (5 mg total) by mouth every 4 (four) hours as needed for severe pain. 30 tablet 0   No current facility-administered medications for this visit.   Facility-Administered Medications Ordered in Other Visits  Medication Dose Route Frequency Provider Last Rate Last Admin   CARBOplatin (PARAPLATIN) 470 mg in sodium chloride 0.9 % 250 mL chemo infusion  470 mg Intravenous Once Alvy Bimler, [name], MD       dostarlimab-gxly (JEMPERLI) 500 mg in sodium chloride 0.9 % 100 mL (4.5455 mg/mL) chemo infusion  500 mg Intravenous Once Alvy Bimler, [name], MD       fosaprepitant (EMEND) 150 mg in sodium chloride 0.9 % 145 mL IVPB  150 mg Intravenous Once Alvy Bimler, [name], MD 450 mL/hr at 03/14/22 1106 150 mg at 03/14/22 1106   heparin lock flush 100 unit/mL  500 Units Intracatheter Once PRN Alvy Bimler, [name], MD       PACLitaxel (TAXOL) 282 mg in sodium chloride 0.9 % 250 mL chemo infusion (> 27m/m2)  175 mg/m2 (Treatment Plan Recorded) Intravenous Once Syan Cullimore, MD       sodium chloride flush (NS) 0.9 % injection 10 mL  10 mL Intracatheter PRN GAlvy Bimler Grae Cannata, MD        SUMMARY OF ONCOLOGIC HISTORY: Oncology History Overview Note  HER2 negative   [place]  12/03/2021 Initial Diagnosis   Patient reports several month history of intermittent pelvic pain that she describes as discomfort or sometimes cramping, that has worsened with time.  She notes that this is tolerable.  She began having postmenopausal bleeding started on 6/17 with a few spots of blood.     12/29/2021 Imaging   1. Bilateral large multiloculated cystic lesions of the ovaries, concerning for ovarian malignancy. Recommend gynecologic consultation and pelvic ultrasound and/or pelvic MRI with contrast for further evaluation. 2. Large  heterogeneous mass of the posterior pelvis likely arising from the lower uterus, possibly a fibroid, although malignancy is also a concern. Recommend attention on pelvic ultrasound or MRI as above. 3. Additional mass arising from the right uterine fundus which correlates with fibroid described on prior 2015 ultrasound. 4. Nonspecific small ground-glass nodule of the right lower lobe measuring 6 mm. Recommend follow-up chest CT in 6 months for further evaluation. 5. Aortic Atherosclerosis (ICD10-I70.0).   12/30/2021 Pathology Results   A. CERVIX, BIOPSY:  -  Scantly cellular specimen with predominantly blood, fibrin and acute inflammatory infiltrate but with scattered highly atypical cells highly suspicious for malignancy.   Note: A p16 shows strong positivity in these scattered cells; however, while p53 is mildly increased definitive strong overexpression or under expression is not identified.  These findings support the suspicion for malignancy; however, are insufficient for a definitive diagnosis.     01/03/2022 Imaging   MRI pelvis  1. Large mixed solid and cystic lesions of the bilateral ovaries, measuring 8.4 x 7.0 x 5.4 cm on the right and 10.3 x 8.7 x 8.1  cm on the left, highly concerning for primary ovarian malignancy and contralateral ovarian metastasis.   2. Homogeneously enhancing mass arising from the right aspect of the uterine body and fundus measuring 6.0 x 5.5 x 4.3 cm, consistent with a uterine fibroid and similar to prior ultrasound dated 2015.   3. An additional, much more heterogeneous mass which appears to arise from the cervix or posterior lower uterine segment measuring 8.1 x 6.4 x 6.2 cm. This may reflect an additional fibroid with internal degeneration, however this was not clearly visualized on ultrasound dated 2015 and is highly suspicious for a cervical mass as significant enlargement of benign fibroids is not expected following menopause. This mass appears to closely abut  and compress or perhaps even directly involve the adjacent rectum.   4.  No evidence of lymphadenopathy in the pelvis.   01/12/2022 PET scan   1. Hypermetabolic heterogeneous mass centered on the cervix/lower uterine segment is most consistent with primary gynecologic neoplasm. 2. Multiloculated complex bilateral adnexal cystic lesions with hypermetabolic soft tissue component likely reflect metastatic lesions. 3. No hypermetabolic abdominopelvic adenopathy. 4. No evidence of hypermetabolic metastatic disease in the chest or abdomen.   01/18/2022 Pathology Results   FINAL MICROSCOPIC DIAGNOSIS:   A. CERVICAL MASS, BIOPSIES:  - Poorly differentiated carcinoma with necrosis, consistent with high-grade serous carcinoma, see comment   B. CERVICAL MASS, EXCISION:  - Poorly differentiated carcinoma with necrosis, consistent with high-grade serous carcinoma see comment  - Edges of resected tissue fragments are positive for carcinoma   COMMENT:   A and B.   The tumor cells are positive for PAX8, ER and p16 immunostains.  Immunostain for p53 shows significant overexpression. Immunostain for CK5/6 shows weak focal labeling while immunostain for  p63 is negative.  The immunoprofile is consistent with above interpretation.  The carcinoma is likely endometrial or ovarian origin with secondary involvement of the cervix.     01/23/2022 Initial Diagnosis   Uterine cancer ([place])   01/23/2022 Cancer Staging   Staging form: Corpus Uteri - Carcinoma and Carcinosarcoma, AJCC 8th Edition - Clinical stage from 01/23/2022: FIGO Stage IIIA (cT3a, cN0, cM0) - Signed by Tiffany Lark, MD on 01/23/2022 Stage prefix: Initial diagnosis   01/31/2022 - 01/31/2022 Chemotherapy   Patient is on Treatment Plan : UTERINE ENDOMETRIAL Dostarlimab-gxly (500 mg) + Carboplatin (AUC 5) + Paclitaxel (175 mg/m2) q21d x 6 cycles / Dostarlimab-gxly (1000 mg) q42d x 6 cycles      01/31/2022 -  Chemotherapy   Patient is on Treatment Plan :  UTERINE ENDOMETRIAL Dostarlimab-gxly (500 mg) + Carboplatin (AUC 5) + Paclitaxel (175 mg/m2) q21d x 6 cycles / Dostarlimab-gxly (1000 mg) q42d x 6 cycles        PHYSICAL EXAMINATION: ECOG PERFORMANCE STATUS: 1 - Symptomatic but completely ambulatory  Vitals:   03/14/22 0939  BP: (!) 147/81  Pulse: 71  Resp: 18  Temp: 97.7 F (36.5 C)  SpO2: 100%   Filed Weights   03/14/22 0939  Weight: 130 lb 12.8 oz (59.3 kg)    GENERAL:alert, no distress and comfortable NEURO: alert & oriented x 3 with fluent speech, no focal motor/sensory deficits  LABORATORY DATA:  I have reviewed the data as listed    Component Value Date/Time   NA 138 03/14/2022 0917   K 4.1 03/14/2022 0917   CL 104 03/14/2022 0917   CO2 22 03/14/2022 0917   GLUCOSE 428 (H) 03/14/2022 0917   BUN 21 (H) 03/14/2022 0917   CREATININE  0.82 03/14/2022 0917   CALCIUM 9.2 03/14/2022 0917   PROT 7.2 03/14/2022 0917   ALBUMIN 4.2 03/14/2022 0917   AST 16 03/14/2022 0917   ALT 20 03/14/2022 0917   ALKPHOS 66 03/14/2022 0917   BILITOT 0.2 (L) 03/14/2022 0917   GFRNONAA >60 03/14/2022 0917    No results found for: "SPEP", "UPEP"  Lab Results  Component Value Date   WBC 8.5 03/14/2022   NEUTROABS 7.1 03/14/2022   HGB 11.8 (L) 03/14/2022   HCT 35.3 (L) 03/14/2022   MCV 74.9 (L) 03/14/2022   PLT 179 03/14/2022      Chemistry      Component Value Date/Time   NA 138 03/14/2022 0917   K 4.1 03/14/2022 0917   CL 104 03/14/2022 0917   CO2 22 03/14/2022 0917   BUN 21 (H) 03/14/2022 0917   CREATININE 0.82 03/14/2022 0917      Component Value Date/Time   CALCIUM 9.2 03/14/2022 0917   ALKPHOS 66 03/14/2022 0917   AST 16 03/14/2022 0917   ALT 20 03/14/2022 0917   BILITOT 0.2 (L) 03/14/2022 5423

## 2022-03-14 NOTE — Assessment & Plan Note (Signed)
This has resolved She has started to gain weight She is not taking Remeron

## 2022-03-14 NOTE — Assessment & Plan Note (Signed)
This is better controlled She felt that she does not need to be on antianxiety medicines

## 2022-03-15 ENCOUNTER — Other Ambulatory Visit: Payer: Self-pay

## 2022-03-16 LAB — T4: T4, Total: 7.6 ug/dL (ref 4.5–12.0)

## 2022-03-20 ENCOUNTER — Encounter: Payer: Self-pay | Admitting: Licensed Clinical Social Worker

## 2022-03-20 ENCOUNTER — Other Ambulatory Visit: Payer: Self-pay | Admitting: Licensed Clinical Social Worker

## 2022-03-20 ENCOUNTER — Inpatient Hospital Stay: Payer: No Typology Code available for payment source

## 2022-03-20 ENCOUNTER — Inpatient Hospital Stay
Payer: No Typology Code available for payment source | Attending: Gynecologic Oncology | Admitting: Licensed Clinical Social Worker

## 2022-03-20 ENCOUNTER — Other Ambulatory Visit: Payer: Self-pay

## 2022-03-20 DIAGNOSIS — C539 Malignant neoplasm of cervix uteri, unspecified: Secondary | ICD-10-CM

## 2022-03-20 DIAGNOSIS — Z5112 Encounter for antineoplastic immunotherapy: Secondary | ICD-10-CM | POA: Insufficient documentation

## 2022-03-20 DIAGNOSIS — G620 Drug-induced polyneuropathy: Secondary | ICD-10-CM | POA: Insufficient documentation

## 2022-03-20 DIAGNOSIS — Z79899 Other long term (current) drug therapy: Secondary | ICD-10-CM | POA: Insufficient documentation

## 2022-03-20 DIAGNOSIS — Z803 Family history of malignant neoplasm of breast: Secondary | ICD-10-CM | POA: Diagnosis not present

## 2022-03-20 DIAGNOSIS — N83201 Unspecified ovarian cyst, right side: Secondary | ICD-10-CM | POA: Insufficient documentation

## 2022-03-20 DIAGNOSIS — Z5111 Encounter for antineoplastic chemotherapy: Secondary | ICD-10-CM | POA: Insufficient documentation

## 2022-03-20 DIAGNOSIS — R739 Hyperglycemia, unspecified: Secondary | ICD-10-CM | POA: Insufficient documentation

## 2022-03-20 DIAGNOSIS — N83202 Unspecified ovarian cyst, left side: Secondary | ICD-10-CM | POA: Insufficient documentation

## 2022-03-20 DIAGNOSIS — N950 Postmenopausal bleeding: Secondary | ICD-10-CM | POA: Insufficient documentation

## 2022-03-20 NOTE — Progress Notes (Signed)
REFERRING PROVIDER: [name], MD [place],  [place] [address]  PRIMARY PROVIDER:  Lawerance Cruel, MD  PRIMARY REASON FOR VISIT:  1. 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 Family history of breast cancer      HISTORY OF PRESENT ILLNESS:   Tiffany [name], a 61 y.o. female, was seen for a [place] cancer genetics consultation at the request of Dr. Berline Lopes due to a personal and family history of cancer.  Tiffany [name] presents to clinic today to discuss the possibility of a hereditary predisposition to cancer, genetic testing, and to further clarify her future cancer risks, as well as potential cancer risks for family members.   CANCER HISTORY:  Oncology History Overview Note  HER2 negative   Uterine cancer ([place])  12/03/2021 Initial Diagnosis   Patient reports several month history of intermittent pelvic pain that she describes as discomfort or sometimes cramping, that has worsened with time.  She notes that this is tolerable.  She began having postmenopausal bleeding started on 6/17 with a few spots of blood.     12/29/2021 Imaging   1. Bilateral large multiloculated cystic lesions of the ovaries, concerning for ovarian malignancy. Recommend gynecologic consultation and pelvic ultrasound and/or pelvic MRI with contrast for further evaluation. 2. Large heterogeneous mass of the posterior pelvis likely arising from the lower uterus, possibly a fibroid, although malignancy is also a concern. Recommend attention on pelvic ultrasound or MRI as above. 3. Additional mass arising from the right uterine fundus which correlates with fibroid described on prior 2015 ultrasound. 4. Nonspecific small ground-glass nodule of the right lower lobe measuring 6 mm. Recommend follow-up chest CT in 6 months for further evaluation. 5. Aortic Atherosclerosis (ICD10-I70.0).   12/30/2021 Pathology Results   A. CERVIX, BIOPSY:  -  Scantly cellular specimen with predominantly  blood, fibrin and acute inflammatory infiltrate but with scattered highly atypical cells highly suspicious for malignancy.   Note: A p16 shows strong positivity in these scattered cells; however, while p53 is mildly increased definitive strong overexpression or under expression is not identified.  These findings support the suspicion for malignancy; however, are insufficient for a definitive diagnosis.     01/03/2022 Imaging   MRI pelvis  1. Large mixed solid and cystic lesions of the bilateral ovaries, measuring 8.4 x 7.0 x 5.4 cm on the right and 10.3 x 8.7 x 8.1 cm on the left, highly concerning for primary ovarian malignancy and contralateral ovarian metastasis.   2. Homogeneously enhancing mass arising from the right aspect of the uterine body and fundus measuring 6.0 x 5.5 x 4.3 cm, consistent with a uterine fibroid and similar to prior ultrasound dated 2015.   3. An additional, much more heterogeneous mass which appears to arise from the cervix or posterior lower uterine segment measuring 8.1 x 6.4 x 6.2 cm. This may reflect an additional fibroid with internal degeneration, however this was not clearly visualized on ultrasound dated 2015 and is highly suspicious for a cervical mass as significant enlargement of benign fibroids is not expected following menopause. This mass appears to closely abut and compress or perhaps even directly involve the adjacent rectum.   4.  No evidence of lymphadenopathy in the pelvis.   01/12/2022 PET scan   1. Hypermetabolic heterogeneous mass centered on the cervix/lower uterine segment is most consistent with primary gynecologic neoplasm. 2. Multiloculated complex bilateral adnexal cystic lesions with hypermetabolic soft tissue component likely reflect metastatic lesions. 3. No  hypermetabolic abdominopelvic adenopathy. 4. No evidence of hypermetabolic metastatic disease in the chest or abdomen.   01/18/2022 Pathology Results   FINAL MICROSCOPIC DIAGNOSIS:    A. CERVICAL MASS, BIOPSIES:  - Poorly differentiated carcinoma with necrosis, consistent with high-grade serous carcinoma, see comment   B. CERVICAL MASS, EXCISION:  - Poorly differentiated carcinoma with necrosis, consistent with high-grade serous carcinoma see comment  - Edges of resected tissue fragments are positive for carcinoma   COMMENT:   A and B.   The tumor cells are positive for PAX8, ER and p16 immunostains.  Immunostain for p53 shows significant overexpression. Immunostain for CK5/6 shows weak focal labeling while immunostain for  p63 is negative.  The immunoprofile is consistent with above interpretation.  The carcinoma is likely endometrial or ovarian origin with secondary involvement of the cervix.     01/23/2022 Initial Diagnosis   Uterine cancer ([place])   01/23/2022 Cancer Staging   Staging form: Corpus Uteri - Carcinoma and Carcinosarcoma, AJCC 8th Edition - Clinical stage from 01/23/2022: FIGO Stage IIIA (cT3a, cN0, cM0) - Signed by [name], MD on 01/23/2022 Stage prefix: Initial diagnosis   01/31/2022 - 01/31/2022 Chemotherapy   Patient is on Treatment Plan : UTERINE ENDOMETRIAL Dostarlimab-gxly (500 mg) + Carboplatin (AUC 5) + Paclitaxel (175 mg/m2) q21d x 6 cycles / Dostarlimab-gxly (1000 mg) q42d x 6 cycles      01/31/2022 -  Chemotherapy   Patient is on Treatment Plan : UTERINE ENDOMETRIAL Dostarlimab-gxly (500 mg) + Carboplatin (AUC 5) + Paclitaxel (175 mg/m2) q21d x 6 cycles / Dostarlimab-gxly (1000 mg) q42d x 6 cycles        In 2023, at the age of 58, [name] was diagnosed with cancer, this is being treated as uterine cancer but may possibly be of ovarian origin.   RISK FACTORS:  Menarche was at age 46.  First live birth at age 89.  Ovaries intact: yes.  Hysterectomy: no.  Menopausal status: postmenopausal.  HRT use: 0 years. Colonoscopy: yes;  1 polyp . Mammogram within the last year: yes. Number of breast biopsies: possibly 1 biopsy 20 years ago,  calcifications.   Past Medical History:  Diagnosis Date   Anxiety    Depression    GERD (gastroesophageal reflux disease)    Hypothyroidism    not on medications currently    Past Surgical History:  Procedure Laterality Date   CESAREAN SECTION     DILATION AND CURETTAGE OF UTERUS     61yo for SAB   IR IMAGING GUIDED PORT INSERTION  01/25/2022   LESION REMOVAL N/A 01/18/2022   Procedure: CERVICAL BIOPSIES;  Surgeon: [name], MD;  Location: [facility];  Service: Gynecology;  Laterality: N/A;    FAMILY HISTORY:  We obtained a detailed, 4-generation family history.  Significant diagnoses are listed below: Family History  Problem Relation Age of Onset   Thyroid disease Mother    Breast cancer Mother        dx late 34s   Cancer Other        unk type, d. 84s-40s   Colon cancer Neg Hx    Ovarian cancer Neg Hx    Endometrial cancer Neg Hx    Pancreatic cancer Neg Hx    Prostate cancer Neg Hx    Ms. Morash has 1 son, 56. She has 2 brothers, 62 and 54. No cancer history.  Ms. Limones mother had breast cancer in her late 53s and had metastatic recurrence later in life,  she passed at 8. Maternal great uncle had cancer, unknown type, and passed in his 45s-40s.   Ms. Gauer father is living at 32 and has no cancer history. No known cancers on this side of the family.  Ms. Easton is unaware of previous family history of genetic testing for hereditary cancer risks. There is no reported Ashkenazi Jewish ancestry. There is no known consanguinity.    GENETIC COUNSELING ASSESSMENT: Ms. Kneale is a 61 y.o. female with a personal history of uterine vs ovarian cancer and a family history of young onset breast cancer which is somewhat suggestive of a hereditary cancer syndrome and predisposition to cancer. We, therefore, discussed and recommended the following at today's visit.   DISCUSSION: We discussed that approximately 10% of cancer is hereditary. Most cases of hereditary uterine cancer are  associated with Lynch syndrome genes, although there are other genes associated with hereditary cancer as well, including the BRCA1/2 genes which increase risk for breast and ovarian cancer. Cancers and risks are gene specific. We discussed that testing is beneficial for several reasons including knowing about cancer risks, identifying potential screening and risk-reduction options that may be appropriate, and to understand if other family members could be at risk for cancer and allow them to undergo genetic testing.   We reviewed the characteristics, features and inheritance patterns of hereditary cancer syndromes. We also discussed genetic testing, including the appropriate family members to test, the process of testing, insurance coverage and turn-around-time for results. We discussed the implications of a negative, positive and/or variant of uncertain significant result. We recommended Ms. Ignasiak pursue genetic testing for the [facility] Multi-Cancer+RNA gene panel.   Based on Ms. Knotek family history of cancer, she meets medical criteria for genetic testing. Despite that she meets criteria, she may still have an out of pocket cost. We discussed that if her out of pocket cost for testing is over $100, the laboratory will call and confirm whether she wants to proceed with testing.  If the out of pocket cost of testing is less than $100 she will be billed by the genetic testing laboratory.   PLAN: After considering the risks, benefits, and limitations, Ms. Hocevar provided informed consent to pursue genetic testing. Patient will have blood drawn 10/17 and her sample will be sent to [facility] for analysis of the Multi-Cancer+RNA panel. Results should be available within approximately 2-3 weeks' time, at which point they will be disclosed by telephone to Ms. Eckstrom, as will any additional recommendations warranted by these results. Ms. Mckowen will receive a summary of her genetic counseling visit and a copy of  her results once available. This information will also be available in Epic.   Ms. Mckinstry questions were answered to her satisfaction today. Our contact information was provided should additional questions or concerns arise. Thank you for the referral and allowing [place] to share in the care of your patient.   Faith Rogue, MS, [facility] Genetic Counselor [email or web address] Phone: [phone]  The patient was seen for a total of 30 minutes in face-to-face genetic counseling.  Patient's friend Rush Landmark was also present. Dr. Grayland Ormond was available for discussion regarding this case.   _______________________________________________________________________ For Office Staff:  Number of people involved in session: 2 Was an Intern/ student involved with case: no

## 2022-03-28 ENCOUNTER — Other Ambulatory Visit (HOSPITAL_COMMUNITY): Payer: Self-pay

## 2022-04-03 MED FILL — Dexamethasone Sodium Phosphate Inj 100 MG/10ML: INTRAMUSCULAR | Qty: 1 | Status: AC

## 2022-04-03 MED FILL — Fosaprepitant Dimeglumine For IV Infusion 150 MG (Base Eq): INTRAVENOUS | Qty: 5 | Status: AC

## 2022-04-04 ENCOUNTER — Encounter: Payer: Self-pay | Admitting: Hematology and Oncology

## 2022-04-04 ENCOUNTER — Inpatient Hospital Stay (HOSPITAL_BASED_OUTPATIENT_CLINIC_OR_DEPARTMENT_OTHER): Payer: No Typology Code available for payment source | Admitting: Hematology and Oncology

## 2022-04-04 ENCOUNTER — Inpatient Hospital Stay: Payer: No Typology Code available for payment source

## 2022-04-04 ENCOUNTER — Inpatient Hospital Stay: Payer: No Typology Code available for payment source | Admitting: Dietician

## 2022-04-04 ENCOUNTER — Inpatient Hospital Stay: Payer: No Typology Code available for payment source | Admitting: Licensed Clinical Social Worker

## 2022-04-04 VITALS — BP 146/76 | HR 110 | Resp 18 | Ht 63.0 in | Wt 130.2 lb

## 2022-04-04 VITALS — HR 98 | Temp 97.7°F

## 2022-04-04 DIAGNOSIS — T50905A Adverse effect of unspecified drugs, medicaments and biological substances, initial encounter: Secondary | ICD-10-CM

## 2022-04-04 DIAGNOSIS — Z79899 Other long term (current) drug therapy: Secondary | ICD-10-CM | POA: Diagnosis not present

## 2022-04-04 DIAGNOSIS — C539 Malignant neoplasm of cervix uteri, unspecified: Secondary | ICD-10-CM

## 2022-04-04 DIAGNOSIS — T451X5A Adverse effect of antineoplastic and immunosuppressive drugs, initial encounter: Secondary | ICD-10-CM

## 2022-04-04 DIAGNOSIS — G620 Drug-induced polyneuropathy: Secondary | ICD-10-CM | POA: Diagnosis not present

## 2022-04-04 DIAGNOSIS — N950 Postmenopausal bleeding: Secondary | ICD-10-CM | POA: Diagnosis not present

## 2022-04-04 DIAGNOSIS — R739 Hyperglycemia, unspecified: Secondary | ICD-10-CM

## 2022-04-04 DIAGNOSIS — Z803 Family history of malignant neoplasm of breast: Secondary | ICD-10-CM

## 2022-04-04 DIAGNOSIS — N83202 Unspecified ovarian cyst, left side: Secondary | ICD-10-CM | POA: Diagnosis not present

## 2022-04-04 DIAGNOSIS — N83201 Unspecified ovarian cyst, right side: Secondary | ICD-10-CM | POA: Diagnosis not present

## 2022-04-04 DIAGNOSIS — Z5111 Encounter for antineoplastic chemotherapy: Secondary | ICD-10-CM | POA: Diagnosis present

## 2022-04-04 DIAGNOSIS — Z5112 Encounter for antineoplastic immunotherapy: Secondary | ICD-10-CM | POA: Diagnosis present

## 2022-04-04 LAB — CBC WITH DIFFERENTIAL (CANCER CENTER ONLY)
Abs Immature Granulocytes: 0.15 10*3/uL — ABNORMAL HIGH (ref 0.00–0.07)
Basophils Absolute: 0 10*3/uL (ref 0.0–0.1)
Basophils Relative: 0 %
Eosinophils Absolute: 0 10*3/uL (ref 0.0–0.5)
Eosinophils Relative: 0 %
HCT: 37.5 % (ref 36.0–46.0)
Hemoglobin: 12.6 g/dL (ref 12.0–15.0)
Immature Granulocytes: 1 %
Lymphocytes Relative: 15 %
Lymphs Abs: 1.8 10*3/uL (ref 0.7–4.0)
MCH: 25 pg — ABNORMAL LOW (ref 26.0–34.0)
MCHC: 33.6 g/dL (ref 30.0–36.0)
MCV: 74.4 fL — ABNORMAL LOW (ref 80.0–100.0)
Monocytes Absolute: 0.2 10*3/uL (ref 0.1–1.0)
Monocytes Relative: 2 %
Neutro Abs: 10.1 10*3/uL — ABNORMAL HIGH (ref 1.7–7.7)
Neutrophils Relative %: 82 %
Platelet Count: 170 10*3/uL (ref 150–400)
RBC: 5.04 MIL/uL (ref 3.87–5.11)
RDW: 15.2 % (ref 11.5–15.5)
WBC Count: 12.3 10*3/uL — ABNORMAL HIGH (ref 4.0–10.5)
nRBC: 0 % (ref 0.0–0.2)

## 2022-04-04 LAB — CMP (CANCER CENTER ONLY)
ALT: 23 U/L (ref 0–44)
AST: 18 U/L (ref 15–41)
Albumin: 4.6 g/dL (ref 3.5–5.0)
Alkaline Phosphatase: 80 U/L (ref 38–126)
Anion gap: 12 (ref 5–15)
BUN: 21 mg/dL — ABNORMAL HIGH (ref 6–20)
CO2: 23 mmol/L (ref 22–32)
Calcium: 9.9 mg/dL (ref 8.9–10.3)
Chloride: 105 mmol/L (ref 98–111)
Creatinine: 0.71 mg/dL (ref 0.44–1.00)
GFR, Estimated: >60 mL/min (ref >60)
Glucose, Bld: 198 mg/dL — ABNORMAL HIGH (ref 70–99)
Potassium: 3.8 mmol/L (ref 3.5–5.1)
Sodium: 140 mmol/L (ref 135–145)
Total Bilirubin: 0.3 mg/dL (ref 0.3–1.2)
Total Protein: 7.5 g/dL (ref 6.5–8.1)

## 2022-04-04 LAB — GENETIC SCREENING ORDER

## 2022-04-04 MED ORDER — PALONOSETRON HCL INJECTION 0.25 MG/5ML
0.2500 mg | Freq: Once | INTRAVENOUS | Status: AC
  Administered 2022-04-04: 0.25 mg via INTRAVENOUS
  Filled 2022-04-04: qty 5

## 2022-04-04 MED ORDER — FAMOTIDINE IN NACL 20-0.9 MG/50ML-% IV SOLN
20.0000 mg | Freq: Once | INTRAVENOUS | Status: AC
  Administered 2022-04-04: 20 mg via INTRAVENOUS
  Filled 2022-04-04: qty 50

## 2022-04-04 MED ORDER — SODIUM CHLORIDE 0.9 % IV SOLN
500.0000 mg | Freq: Once | INTRAVENOUS | Status: AC
  Administered 2022-04-04: 500 mg via INTRAVENOUS
  Filled 2022-04-04: qty 10

## 2022-04-04 MED ORDER — CETIRIZINE HCL 10 MG/ML IV SOLN
10.0000 mg | Freq: Once | INTRAVENOUS | Status: AC
  Administered 2022-04-04: 10 mg via INTRAVENOUS
  Filled 2022-04-04: qty 1

## 2022-04-04 MED ORDER — SODIUM CHLORIDE 0.9 % IV SOLN
175.0000 mg/m2 | Freq: Once | INTRAVENOUS | Status: AC
  Administered 2022-04-04: 282 mg via INTRAVENOUS
  Filled 2022-04-04: qty 47

## 2022-04-04 MED ORDER — SODIUM CHLORIDE 0.9 % IV SOLN
10.0000 mg | Freq: Once | INTRAVENOUS | Status: AC
  Administered 2022-04-04: 10 mg via INTRAVENOUS
  Filled 2022-04-04: qty 10

## 2022-04-04 MED ORDER — SODIUM CHLORIDE 0.9% FLUSH
10.0000 mL | INTRAVENOUS | Status: DC | PRN
  Administered 2022-04-04: 10 mL

## 2022-04-04 MED ORDER — SODIUM CHLORIDE 0.9 % IV SOLN
473.5000 mg | Freq: Once | INTRAVENOUS | Status: AC
  Administered 2022-04-04: 470 mg via INTRAVENOUS
  Filled 2022-04-04: qty 47

## 2022-04-04 MED ORDER — SODIUM CHLORIDE 0.9% FLUSH
10.0000 mL | Freq: Once | INTRAVENOUS | Status: AC
  Administered 2022-04-04: 10 mL

## 2022-04-04 MED ORDER — SODIUM CHLORIDE 0.9 % IV SOLN: Freq: Once | INTRAVENOUS | Status: AC

## 2022-04-04 MED ORDER — HEPARIN SOD (PORK) LOCK FLUSH 100 UNIT/ML IV SOLN
500.0000 [IU] | Freq: Once | INTRAVENOUS | Status: AC | PRN
  Administered 2022-04-04: 500 [IU]

## 2022-04-04 MED ORDER — SODIUM CHLORIDE 0.9 % IV SOLN
150.0000 mg | Freq: Once | INTRAVENOUS | Status: AC
  Administered 2022-04-04: 150 mg via INTRAVENOUS
  Filled 2022-04-04: qty 150

## 2022-04-04 NOTE — Assessment & Plan Note (Signed)
This is improved since last visit Observe only

## 2022-04-04 NOTE — Progress Notes (Signed)
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, [name], I will consider modifying the dose of the treatment   Drug-induced hyperglycemia This is improved since last visit Observe only  No orders of the defined types were placed in this encounter.   All questions were answered. The patient knows to call the clinic with any problems, questions or concerns. The total time spent in the appointment was 20 minutes encounter with patients including review of chart and various tests results, discussions about plan of care and coordination of care plan   [name], MD 04/04/2022 9:44 AM  INTERVAL HISTORY: Please see below for problem oriented charting. she returns for treatment follow-up seen prior to cycle 4 of chemo She has minimal intermittent pelvic discomfort but denies recent vaginal bleeding or discharge She has very mild tingling sensation in her hands and feet intermittently REVIEW OF SYSTEMS:   Constitutional: Denies fevers, chills or abnormal weight loss Eyes: Denies blurriness of  vision Ears, nose, mouth, throat, [name], [name], chest discomfort or lower extremity swelling Gastrointestinal:  Denies nausea, heartburn or change in bowel habits Skin: Denies abnormal skin rashes Lymphatics: Denies new lymphadenopathy or easy bruising Neurological:Denies numbness, tingling or new weaknesses Behavioral/Psych: Mood is stable, no new changes  All other systems were reviewed with the patient and are negative.  I have reviewed the past medical history, past surgical history, social history and family history with the patient and they are unchanged from previous note.  ALLERGIES:  is allergic to seasonal ic [octacosanol] and latex.  MEDICATIONS:  Current Outpatient Medications  Medication Sig Dispense Refill   ALPRAZolam (XANAX) 0.25 MG tablet Take 0.25 mg by mouth every 8 (eight) hours as needed for anxiety.     dexamethasone (DECADRON) 4 MG tablet Take 2 tablets by mouth the night before and 2 tabs the morning of chemotherapy, every 3 weeks, for 6 cycles 36 tablet 6   oxyCODONE (OXY IR/ROXICODONE) 5 MG immediate release tablet Take 1 tablet (5 mg total) by mouth every 4 (four) hours as needed for severe pain. 30 tablet 0   No current facility-administered medications for this visit.   Facility-Administered Medications Ordered in Other Visits  Medication Dose Route Frequency Provider Last Rate Last Admin   CARBOplatin (PARAPLATIN) 470 mg in sodium chloride 0.9 % 250 mL chemo infusion  470 mg Intravenous Once Alvy Bimler, Nayra Coury, MD       dexamethasone (DECADRON) 10 mg in sodium chloride 0.9 % 50 mL IVPB  10 mg Intravenous Once [name], MD 204 mL/hr at 04/04/22  0944 10 mg at 04/04/22 0944   dostarlimab-gxly (JEMPERLI) 500 mg in sodium chloride 0.9 % 100 mL (4.5455 mg/mL) chemo infusion  500 mg Intravenous Once Alvy Bimler, Sevin Farone, MD       famotidine (PEPCID) IVPB 20 mg premix   20 mg Intravenous Once Alvy Bimler, Rilea Arutyunyan, MD       fosaprepitant (EMEND) 150 mg in sodium chloride 0.9 % 145 mL IVPB  150 mg Intravenous Once [name], MD 450 mL/hr at 04/04/22 0943 150 mg at 04/04/22 0943   heparin lock flush 100 unit/mL  500 Units Intracatheter Once PRN Alvy Bimler, Gianne Shugars, MD       PACLitaxel (TAXOL) 282 mg in sodium chloride 0.9 % 250 mL chemo infusion (> 34m/m2)  175 mg/m2 (Treatment Plan Recorded) Intravenous Once GAlvy Bimler Heith Haigler, MD       sodium chloride flush (NS) 0.9 % injection 10 mL  10 mL Intracatheter PRN GAlvy Bimler Faiz Weber, MD        SUMMARY OF ONCOLOGIC HISTORY: Oncology History Overview Note  HER2 negative   [place]  12/03/20[place] Initial Diagnosis   Patient reports several month history of intermittent pelvic pain that she describes as discomfort or sometimes cramping, that has worsened with time.  She notes that this is tolerable.  She began having postmenopausal bleeding started on 6/17 with a few spots of blood.     12/29/2021 Imaging   1. Bilateral large multiloculated cystic lesions of the ovaries, concerning for ovarian malignancy. Recommend gynecologic consultation and pelvic ultrasound and/or pelvic MRI with contrast for further evaluation. 2. Large heterogeneous mass of the posterior pelvis likely arising from the lower uterus, possibly a fibroid, although malignancy is also a concern. Recommend attention on pelvic ultrasound or MRI as above. 3. Additional mass arising from the right uterine fundus which correlates with fibroid described on prior 2015 ultrasound. 4. Nonspecific small ground-glass nodule of the right lower lobe measuring 6 mm. Recommend follow-up chest CT in 6 months for further evaluation. 5. Aortic Atherosclerosis (ICD10-I70.0).   12/30/2021 Pathology Results   A. CERVIX, BIOPSY:  -  Scantly cellular specimen with predominantly blood, fibrin and acute inflammatory infiltrate but with scattered highly atypical cells highly suspicious for  malignancy.   Note: A p16 shows strong positivity in these scattered cells; however, while p53 is mildly increased definitive strong overexpression or under expression is not identified.  These findings support the suspicion for malignancy; however, are insufficient for a definitive diagnosis.     01/03/2022 Imaging   MRI pelvis  1. Large mixed solid and cystic lesions of the bilateral ovaries, measuring 8.4 x 7.0 x 5.4 cm on the right and 10.3 x 8.7 x 8.1 cm on the left, highly concerning for primary ovarian malignancy and contralateral ovarian metastasis.   2. Homogeneously enhancing mass arising from the right aspect of the uterine body and fundus measuring 6.0 x 5.5 x 4.3 cm, consistent with a uterine fibroid and similar to prior ultrasound dated 2015.   3. An additional, much more heterogeneous mass which appears to arise from the cervix or posterior lower uterine segment measuring 8.1 x 6.4 x 6.2 cm. This may reflect an additional fibroid with internal degeneration, however this was not clearly visualized on ultrasound dated 2015 and is highly suspicious for a cervical mass as significant enlargement of benign fibroids is not expected following menopause. This mass appears to closely abut and compress or perhaps even directly involve the adjacent rectum.   4.  No evidence of lymphadenopathy in  the pelvis.   01/12/2022 PET scan   1. Hypermetabolic heterogeneous mass centered on the cervix/lower uterine segment is most consistent with primary gynecologic neoplasm. 2. Multiloculated complex bilateral adnexal cystic lesions with hypermetabolic soft tissue component likely reflect metastatic lesions. 3. No hypermetabolic abdominopelvic adenopathy. 4. No evidence of hypermetabolic metastatic disease in the chest or abdomen.   01/18/2022 Pathology Results   FINAL MICROSCOPIC DIAGNOSIS:   A. CERVICAL MASS, BIOPSIES:  - Poorly differentiated carcinoma with necrosis, consistent with high-grade  serous carcinoma, see comment   B. CERVICAL MASS, EXCISION:  - Poorly differentiated carcinoma with necrosis, consistent with high-grade serous carcinoma see comment  - Edges of resected tissue fragments are positive for carcinoma   COMMENT:   A and B.   The tumor cells are positive for PAX8, ER and p16 immunostains.  Immunostain for p53 shows significant overexpression. Immunostain for CK5/6 shows weak focal labeling while immunostain for  p63 is negative.  The immunoprofile is consistent with above interpretation.  The carcinoma is likely endometrial or ovarian origin with secondary involvement of the cervix.     01/23/2022 Initial Diagnosis   Uterine cancer ([place])   01/23/2022 Cancer Staging   Staging form: Corpus Uteri - Carcinoma and Carcinosarcoma, AJCC 8th Edition - Clinical stage from 01/23/2022: FIGO Stage IIIA (cT3a, cN0, cM0) - Signed by [name], MD on 01/23/2022 Stage prefix: Initial diagnosis   01/31/2022 - 01/31/2022 Chemotherapy   Patient is on Treatment Plan : UTERINE ENDOMETRIAL Dostarlimab-gxly (500 mg) + Carboplatin (AUC 5) + Paclitaxel (175 mg/m2) q21d x 6 cycles / Dostarlimab-gxly (1000 mg) q42d x 6 cycles      01/31/2022 -  Chemotherapy   Patient is on Treatment Plan : UTERINE ENDOMETRIAL Dostarlimab-gxly (500 mg) + Carboplatin (AUC 5) + Paclitaxel (175 mg/m2) q21d x 6 cycles / Dostarlimab-gxly (1000 mg) q42d x 6 cycles        PHYSICAL EXAMINATION: ECOG PERFORMANCE STATUS: 1 - Symptomatic but completely ambulatory  Vitals:   04/04/22 0844  BP: (!) 146/76  Pulse: (!) 110  Resp: 18  SpO2: 98%   Filed Weights   04/04/22 0844  Weight: 130 lb 3.2 oz (59.1 kg)    GENERAL:alert, no distress and comfortable NEURO: alert & oriented x 3 with fluent speech, no focal motor/sensory deficits  LABORATORY DATA:  I have reviewed the data as listed    Component Value Date/Time   NA 140 04/04/2022 0826   K 3.8 04/04/2022 0826   CL 105 04/04/2022 0826   CO2 23 04/04/2022  0826   GLUCOSE 198 (H) 04/04/2022 0826   BUN 21 (H) 04/04/2022 0826   CREATININE 0.71 04/04/2022 0826   CALCIUM 9.9 04/04/2022 0826   PROT 7.5 04/04/2022 0826   ALBUMIN 4.6 04/04/2022 0826   AST 18 04/04/2022 0826   ALT 23 04/04/2022 0826   ALKPHOS 80 04/04/2022 0826   BILITOT 0.3 04/04/2022 0826   GFRNONAA >60 04/04/2022 0826    No results found for: "SPEP", "UPEP"  Lab Results  Component Value Date   WBC 12.3 (H) 04/04/2022   NEUTROABS 10.1 (H) 04/04/2022   HGB 12.6 04/04/2022   HCT 37.5 04/04/2022   MCV 74.4 (L) 04/04/2022   PLT 170 04/04/2022      Chemistry      Component Value Date/Time   NA 140 04/04/2022 0826   K 3.8 04/04/2022 0826   CL 105 04/04/2022 0826   CO2 23 04/04/2022 0826   BUN 21 (H) 04/04/2022 4627  CREATININE 0.71 04/04/2022 0826      Component Value Date/Time   CALCIUM 9.9 04/04/2022 0826   ALKPHOS 80 04/04/2022 0826   AST 18 04/04/2022 0826   ALT 23 04/04/2022 0826   BILITOT 0.3 04/04/2022 0826

## 2022-04-04 NOTE — Progress Notes (Signed)
Nutrition Follow-up:  Patient with uterine cancer. She is currently receiving carbo/taxol/jemperli q21d.  Met with patient in infusion. She reports appetite is good and eating well. Patient is drinking Ensure Complete at visit. Reports her partner is currently out getting her [employer]. She denies nausea, vomiting, diarrhea, constipation. Patient is hoping she will be able to have surgery before the end of the year.    Medications: Decadron,   Labs: glucose 198, BUN 21  Anthropometrics: Wt 130 lb 3.2 oz today stable   9/26 - 130 lb 12.8 oz    NUTRITION DIAGNOSIS: Food and nutrition related knowledge improved    INTERVENTION:  Continue eating small frequent meals and snacks with adequate calories and protein Discussed importance of protein to promote post-op healing Continue drinking Ensure Plus/equivalent once daily Patient has contact information      MONITORING, EVALUATION, GOAL: weight trends, intake   NEXT VISIT: No follow-up scheduled at this time. Pt encouraged to contact with nutrition questions/concerns

## 2022-04-04 NOTE — Progress Notes (Signed)
[facility] CSW Progress Note  [facility] representative met with patient to follow-up on resources and coping. Pt has received approval for assistance from [employer] for [facility] and she is working with them on specific bills. Her HOA is also working with her.  Pt is coping well now. She is looking forward to her imaging on the 27th and hoping for good news so she may proceed to surgery. Pt continues to care for herself with good sleep, eating, and exercise as well as meditation and planning things to look forward to. She hopes to return to work by January.  CSW will continue to offer support through treatment.    Jessicaann Overbaugh E Caileen Veracruz, LCSW

## 2022-04-04 NOTE — Assessment & Plan Note (Signed)
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

## 2022-04-04 NOTE — Patient Instructions (Addendum)
[facility] MEDICAL [facility]COLOGY  Discharge Instructions: Thank you for choosing [facility] to provide your oncology and hematology care.   If you have a lab appointment with the Cancer Center, please go directly to the Cancer Center and check in at the registration area.   Wear comfortable clothing and clothing appropriate for easy access to any Portacath or PICC line.   We strive to give you quality time with your provider. You may need to reschedule your appointment if you arrive late (15 or more minutes).  Arriving late affects you and other patients whose appointments are after yours.  Also, if you miss three or more appointments without notifying the office, you may be dismissed from the clinic at the provider's discretion.      For prescription refill requests, have your pharmacy contact our office and allow 72 hours for refills to be completed.    Today you received the following chemotherapy and/or immunotherapy agents: Jemperli, Paclitaxel, Carboplatin.   To help prevent nausea and vomiting after your treatment, we encourage you to take your nausea medication as directed.  BELOW ARE SYMPTOMS THAT SHOULD BE REPORTED IMMEDIATELY: *FEVER GREATER THAN 100.4 F (38 C) OR HIGHER *CHILLS OR SWEATING *NAUSEA AND VOMITING THAT IS NOT CONTROLLED WITH YOUR NAUSEA MEDICATION *UNUSUAL SHORTNESS OF BREATH *UNUSUAL BRUISING OR BLEEDING *URINARY PROBLEMS (pain or burning when urinating, or frequent urination) *BOWEL PROBLEMS (unusual diarrhea, constipation, pain near the anus) TENDERNESS IN MOUTH AND THROAT WITH OR WITHOUT PRESENCE OF ULCERS (sore throat, sores in mouth, or a toothache) UNUSUAL RASH, SWELLING OR PAIN  UNUSUAL VAGINAL DISCHARGE OR ITCHING   Items with * indicate a potential emergency and should be followed up as soon as possible or go to the Emergency Department if any problems should occur.  Please show the CHEMOTHERAPY ALERT CARD or IMMUNOTHERAPY  ALERT CARD at check-in to the Emergency Department and triage nurse.  Should you have questions after your visit or need to cancel or reschedule your appointment, please contact [facility]  Dept: [phone]  and follow the prompts.  Office hours are 8:00 a.m. to 4:30 p.m. Monday - Friday. Please note that voicemails left after 4:00 p.m. may not be returned until the following business day.  We are closed weekends and major holidays. You have access to a nurse at all times for urgent questions. Please call the main number to the clinic Dept: [phone] and follow the prompts.   For any non-urgent questions, you may also contact your provider using MyChart. We now offer e-Visits for anyone 18 and older to request care online for non-urgent symptoms. For details visit [email or web address].   Also download the MyChart app! Go to the app store, search "MyChart", open the app, select [facility], and log in with your MyChart username and password.  Masks are optional in the cancer centers. If you would like for your care team to wear a mask while they are taking care of you, please let them know. You may have one support person who is at least 60 years old accompany you for your appointments.  Dostarlimab Injection What is this medication? DOSTARLIMAB (dos tar li mab) treats some types of cancer. It works by helping your immune system slow or stop the spread of cancer cells. It is a monoclonal antibody. This medicine may be used for other purposes; ask your health care provider or pharmacist if you have questions. COMMON BRAND NAME(S): Jemperli What should I   tell my care team before I take this medication? They need to know if you have any of these conditions: Allogeneic stem cell transplant (uses someone else's stem cells) Autoimmune diseases, such as Crohn disease, ulcerative colitis, lupus History of chest radiation Nervous system problems, such as  Guillain-Barre syndrome, myasthenia gravis Organ transplant An unusual or allergic reaction to dostarlimab, other medications, foods, dyes, or preservatives Pregnant or trying to get pregnant Breast-feeding How should I use this medication? This medication is injected into a vein. It is given by your care team in a hospital or clinic setting. A special MedGuide will be given to you before each treatment. Be sure to read this information carefully each time. Talk to your care team about the use of this medication in children. Special care may be needed. Overdosage: If you think you have taken too much of this medicine contact a poison control center or emergency room at once. NOTE: This medicine is only for you. Do not share this medicine with others. What if I miss a dose? Keep appointments for follow-up doses. It is important not to miss your dose. Call your care team if you are unable to keep an appointment. What may interact with this medication? Interactions have not been studied. This list may not describe all possible interactions. Give your health care provider a list of all the medicines, herbs, non-prescription drugs, or dietary supplements you use. Also tell them if you smoke, drink alcohol, or use illegal drugs. Some items may interact with your medicine. What should I watch for while using this medication? Your condition will be monitored carefully while you are receiving this medication. You may need blood work while taking this medication. This medication may cause serious skin reactions. They can happen weeks to months after starting the medication. Contact your care team right away if you notice fevers or flu-like symptoms with a rash. The rash may be red or purple and then turn into blisters or peeling of the skin. You may also notice a red rash with swelling of the face, lips, or lymph nodes in your neck or under your arms. Tell your care team right away if you have any change in  your eyesight. Talk to your care team if you may be pregnant. Serious birth defects can occur if you take this medication during pregnancy and for 4 months after the last dose. You will need a negative pregnancy test before starting this medication. Contraception is recommended while taking this medication and for 4 months after the last dose. Your care team can help you find the option that works for you. Do not breastfeed while taking this medication and for 4 months after the last dose. What side effects may I notice from receiving this medication? Side effects that you should report to your care team as soon as possible: Allergic reactions--skin rash, itching, hives, swelling of the face, lips, tongue, or throat Dry cough, shortness of breath or trouble breathing Eye pain, redness, irritation, or discharge with blurry or decreased vision Heart muscle inflammation--unusual weakness or fatigue, shortness of breath, chest pain, fast or irregular heartbeat, dizziness, swelling of the ankles, feet, or hands Hormone gland problems--headache, sensitivity to light, unusual weakness or fatigue, dizziness, fast or irregular heartbeat, increased sensitivity to cold or heat, excessive sweating, constipation, hair loss, increased thirst or amount of urine, tremors or shaking, irritability Infusion reactions--chest pain, shortness of breath or trouble breathing, feeling faint or lightheaded Kidney injury (glomerulonephritis)--decrease in the amount of urine,   red or dark brown urine, foamy or bubbly urine, swelling of the ankles, hands, or feet Liver injury--right upper belly pain, loss of appetite, nausea, light-colored stool, dark yellow or brown urine, yellowing skin or eyes, unusual weakness or fatigue Pain, tingling, or numbness in the hands or feet, muscle weakness, change in vision, confusion or trouble speaking, loss of balance or coordination, trouble walking, seizures Rash, fever, and swollen lymph  nodes Redness, blistering, peeling, or loosening of the skin, including inside the mouth Sudden or severe stomach pain, bloody diarrhea, fever, nausea, vomiting Side effects that usually do not require medical attention (report these to your care team if they continue or are bothersome): Bone, joint, or muscle pain Diarrhea Fatigue Loss of appetite Nausea Skin rash This list may not describe all possible side effects. Call your doctor for medical advice about side effects. You may report side effects to FDA at 1-800-FDA-1088. Where should I keep my medication? This medication is given in a hospital or clinic. It will not be stored at home. NOTE: This sheet is a summary. It may not cover all possible information. If you have questions about this medicine, talk to your doctor, pharmacist, or health care provider.  2023 Elsevier/Gold Standard (2021-10-18 00:00:00)  Paclitaxel Injection What is this medication? PACLITAXEL (PAK li TAX el) treats some types of cancer. It works by slowing down the growth of cancer cells. This medicine may be used for other purposes; ask your health care provider or pharmacist if you have questions. COMMON BRAND NAME(S): Onxol, Taxol What should I tell my care team before I take this medication? They need to know if you have any of these conditions: Heart disease Liver disease Low white blood cell levels An unusual or allergic reaction to paclitaxel, other medications, foods, dyes, or preservatives If you or your partner are pregnant or trying to get pregnant Breast-feeding How should I use this medication? This medication is injected into a vein. It is given by your care team in a hospital or clinic setting. Talk to your care team about the use of this medication in children. While it may be given to children for selected conditions, precautions do apply. Overdosage: If you think you have taken too much of this medicine contact a poison control center or  emergency room at once. NOTE: This medicine is only for you. Do not share this medicine with others. What if I miss a dose? Keep appointments for follow-up doses. It is important not to miss your dose. Call your care team if you are unable to keep an appointment. What may interact with this medication? Do not take this medication with any of the following: Live virus vaccines Other medications may affect the way this medication works. Talk with your care team about all of the medications you take. They may suggest changes to your treatment plan to lower the risk of side effects and to make sure your medications work as intended. This list may not describe all possible interactions. Give your health care provider a list of all the medicines, herbs, non-prescription drugs, or dietary supplements you use. Also tell them if you smoke, drink alcohol, or use illegal drugs. Some items may interact with your medicine. What should I watch for while using this medication? Your condition will be monitored carefully while you are receiving this medication. You may need blood work while taking this medication. This medication may make you feel generally unwell. This is not uncommon as chemotherapy can affect healthy cells   as well as cancer cells. Report any side effects. Continue your course of treatment even though you feel ill unless your care team tells you to stop. This medication can cause serious allergic reactions. To reduce the risk, your care team may give you other medications to take before receiving this one. Be sure to follow the directions from your care team. This medication may increase your risk of getting an infection. Call your care team for advice if you get a fever, chills, sore throat, or other symptoms of a cold or flu. Do not treat yourself. Try to avoid being around people who are sick. This medication may increase your risk to bruise or bleed. Call your care team if you notice any unusual  bleeding. Be careful brushing or flossing your teeth or using a toothpick because you may get an infection or bleed more easily. If you have any dental work done, tell your dentist you are receiving this medication. Talk to your care team if you may be pregnant. Serious birth defects can occur if you take this medication during pregnancy. Talk to your care team before breastfeeding. Changes to your treatment plan may be needed. What side effects may I notice from receiving this medication? Side effects that you should report to your care team as soon as possible: Allergic reactions--skin rash, itching, hives, swelling of the face, lips, tongue, or throat Heart rhythm changes--fast or irregular heartbeat, dizziness, feeling faint or lightheaded, chest pain, trouble breathing Increase in blood pressure Infection--fever, chills, cough, sore throat, wounds that don't heal, pain or trouble when passing urine, general feeling of discomfort or being unwell Low blood pressure--dizziness, feeling faint or lightheaded, blurry vision Low red blood cell level--unusual weakness or fatigue, dizziness, headache, trouble breathing Painful swelling, warmth, or redness of the skin, blisters or sores at the infusion site Pain, tingling, or numbness in the hands or feet Slow heartbeat--dizziness, feeling faint or lightheaded, confusion, trouble breathing, unusual weakness or fatigue Unusual bruising or bleeding Side effects that usually do not require medical attention (report to your care team if they continue or are bothersome): Diarrhea Hair loss Joint pain Loss of appetite Muscle pain Nausea Vomiting This list may not describe all possible side effects. Call your doctor for medical advice about side effects. You may report side effects to FDA at 1-800-FDA-1088. Where should I keep my medication? This medication is given in a hospital or clinic. It will not be stored at home. NOTE: This sheet is a summary.  It may not cover all possible information. If you have questions about this medicine, talk to your doctor, pharmacist, or health care provider.  2023 Elsevier/Gold Standard (2021-10-20 00:00:00)  Carboplatin Injection What is this medication? CARBOPLATIN (KAR boe pla tin) treats some types of cancer. It works by slowing down the growth of cancer cells. This medicine may be used for other purposes; ask your health care provider or pharmacist if you have questions. COMMON BRAND NAME(S): Paraplatin What should I tell my care team before I take this medication? They need to know if you have any of these conditions: Blood disorders Hearing problems Kidney disease Recent or ongoing radiation therapy An unusual or allergic reaction to carboplatin, cisplatin, other medications, foods, dyes, or preservatives Pregnant or trying to get pregnant Breast-feeding How should I use this medication? This medication is injected into a vein. It is given by your care team in a hospital or clinic setting. Talk to your care team about the use of this medication   in children. Special care may be needed. Overdosage: If you think you have taken too much of this medicine contact a poison control center or emergency room at once. NOTE: This medicine is only for you. Do not share this medicine with others. What if I miss a dose? Keep appointments for follow-up doses. It is important not to miss your dose. Call your care team if you are unable to keep an appointment. What may interact with this medication? Medications for seizures Some antibiotics, such as amikacin, gentamicin, neomycin, streptomycin, tobramycin Vaccines This list may not describe all possible interactions. Give your health care provider a list of all the medicines, herbs, non-prescription drugs, or dietary supplements you use. Also tell them if you smoke, drink alcohol, or use illegal drugs. Some items may interact with your medicine. What should I  watch for while using this medication? Your condition will be monitored carefully while you are receiving this medication. You may need blood work while taking this medication. This medication may make you feel generally unwell. This is not uncommon, as chemotherapy can affect healthy cells as well as cancer cells. Report any side effects. Continue your course of treatment even though you feel ill unless your care team tells you to stop. In some cases, you may be given additional medications to help with side effects. Follow all directions for their use. This medication may increase your risk of getting an infection. Call your care team for advice if you get a fever, chills, sore throat, or other symptoms of a cold or flu. Do not treat yourself. Try to avoid being around people who are sick. Avoid taking medications that contain aspirin, acetaminophen, ibuprofen, naproxen, or ketoprofen unless instructed by your care team. These medications may hide a fever. Be careful brushing or flossing your teeth or using a toothpick because you may get an infection or bleed more easily. If you have any dental work done, tell your dentist you are receiving this medication. Talk to your care team if you wish to become pregnant or think you might be pregnant. This medication can cause serious birth defects. Talk to your care team about effective forms of contraception. Do not breast-feed while taking this medication. What side effects may I notice from receiving this medication? Side effects that you should report to your care team as soon as possible: Allergic reactions--skin rash, itching, hives, swelling of the face, lips, tongue, or throat Infection--fever, chills, cough, sore throat, wounds that don't heal, pain or trouble when passing urine, general feeling of discomfort or being unwell Low red blood cell level--unusual weakness or fatigue, dizziness, headache, trouble breathing Pain, tingling, or numbness in  the hands or feet, muscle weakness, change in vision, confusion or trouble speaking, loss of balance or coordination, trouble walking, seizures Unusual bruising or bleeding Side effects that usually do not require medical attention (report to your care team if they continue or are bothersome): Hair loss Nausea Unusual weakness or fatigue Vomiting This list may not describe all possible side effects. Call your doctor for medical advice about side effects. You may report side effects to FDA at 1-800-FDA-1088. Where should I keep my medication? This medication is given in a hospital or clinic. It will not be stored at home. NOTE: This sheet is a summary. It may not cover all possible information. If you have questions about this medicine, talk to your doctor, pharmacist, or health care provider.  2023 Elsevier/Gold Standard (2021-09-27 00:00:00)\[id number]\ 

## 2022-04-04 NOTE — Assessment & Plan Note (Signed)
she has mild peripheral neuropathy, [name], [name], I will consider modifying the dose of the treatment  

## 2022-04-05 ENCOUNTER — Other Ambulatory Visit: Payer: Self-pay

## 2022-04-11 ENCOUNTER — Other Ambulatory Visit: Payer: Self-pay

## 2022-04-14 ENCOUNTER — Ambulatory Visit (HOSPITAL_COMMUNITY)
Admission: RE | Admit: 2022-04-14 | Discharge: 2022-04-14 | Disposition: A | Discharge status: Home / Self Care | Payer: No Typology Code available for payment source | Source: Ambulatory Visit | Attending: Hematology and Oncology | Admitting: Hematology and Oncology

## 2022-04-14 DIAGNOSIS — C539 Malignant neoplasm of cervix uteri, unspecified: Secondary | ICD-10-CM | POA: Diagnosis present

## 2022-04-14 LAB — GLUCOSE, CAPILLARY: Glucose-Capillary: 108 mg/dL — ABNORMAL HIGH (ref 70–99)

## 2022-04-14 MED ORDER — FLUDEOXYGLUCOSE F - 18 (FDG) INJECTION
6.5000 | Freq: Once | INTRAVENOUS | Status: AC | PRN
  Administered 2022-04-14: 6.47 via INTRAVENOUS

## 2022-04-17 ENCOUNTER — Telehealth: Payer: Self-pay

## 2022-04-17 NOTE — Telephone Encounter (Signed)
Returned her call. She is asking for the PET scan results. Told her the PET scan has not resulted at this time. She is on mychart, told her when the radiologist reads the PET scan the results will come thru on mychart. She verbalized understanding.

## 2022-04-18 ENCOUNTER — Telehealth: Payer: Self-pay

## 2022-04-18 NOTE — Telephone Encounter (Signed)
Returned her call and left a message. She said the PET scan has resulted on mychart and she is scheduled to see Dr. Berline Lopes on Friday 11/3 to get results. She is asking if she should cancel appts on 11/7, left a message to leave appts as scheduled. When she sees Dr. Berline Lopes on 11/3 if appts need to be canceled, then the office will cancel.

## 2022-04-18 NOTE — Telephone Encounter (Signed)
Called the patient back.  Discussed importance of an exam to help determine timing of surgery.  Patient amenable to coming on Friday.

## 2022-04-18 NOTE — Telephone Encounter (Signed)
Pt called office requesting a call from Dr. Berline Lopes. She states she has an appointment on Friday 11/3 with Dr. Berline Lopes.She would like Dr. Berline Lopes to review the PET scan and  decide if she needs to come in for appointment. She states she has Chemo scheduled for 11/7 and feels she does not need both appointments if surgery will be discussed and scheduled at her appointment on Friday.

## 2022-04-20 ENCOUNTER — Encounter: Payer: Self-pay | Admitting: Gynecologic Oncology

## 2022-04-21 ENCOUNTER — Inpatient Hospital Stay (HOSPITAL_BASED_OUTPATIENT_CLINIC_OR_DEPARTMENT_OTHER): Payer: No Typology Code available for payment source | Admitting: Gynecologic Oncology

## 2022-04-21 ENCOUNTER — Encounter: Payer: Self-pay | Admitting: Gynecologic Oncology

## 2022-04-21 ENCOUNTER — Inpatient Hospital Stay: Payer: No Typology Code available for payment source | Attending: Gynecologic Oncology | Admitting: Gynecologic Oncology

## 2022-04-21 ENCOUNTER — Other Ambulatory Visit (HOSPITAL_COMMUNITY): Payer: Self-pay

## 2022-04-21 ENCOUNTER — Telehealth: Payer: Self-pay | Admitting: Surgery

## 2022-04-21 ENCOUNTER — Encounter: Payer: Self-pay | Admitting: Surgery

## 2022-04-21 VITALS — BP 158/77 | HR 101 | Temp 98.6°F | Resp 16 | Ht 63.39 in | Wt 135.0 lb

## 2022-04-21 DIAGNOSIS — C579 Malignant neoplasm of female genital organ, unspecified: Secondary | ICD-10-CM

## 2022-04-21 DIAGNOSIS — R978 Other abnormal tumor markers: Secondary | ICD-10-CM

## 2022-04-21 DIAGNOSIS — C55 Malignant neoplasm of uterus, part unspecified: Secondary | ICD-10-CM | POA: Diagnosis not present

## 2022-04-21 DIAGNOSIS — Z7189 Other specified counseling: Secondary | ICD-10-CM | POA: Insufficient documentation

## 2022-04-21 DIAGNOSIS — Z9221 Personal history of antineoplastic chemotherapy: Secondary | ICD-10-CM | POA: Diagnosis not present

## 2022-04-21 DIAGNOSIS — Z803 Family history of malignant neoplasm of breast: Secondary | ICD-10-CM | POA: Insufficient documentation

## 2022-04-21 DIAGNOSIS — D259 Leiomyoma of uterus, unspecified: Secondary | ICD-10-CM

## 2022-04-21 MED ORDER — SENEXON-S 8.6-50 MG PO TABS
2.0000 | ORAL_TABLET | Freq: Every day | ORAL | 0 refills | Status: DC
  Filled 2022-04-21: qty 30, 15d supply, fill #0

## 2022-04-21 MED ORDER — IBUPROFEN 800 MG PO TABS
800.0000 mg | ORAL_TABLET | Freq: Three times a day (TID) | ORAL | 0 refills | Status: DC | PRN
  Filled 2022-04-21: qty 30, 10d supply, fill #0

## 2022-04-21 MED ORDER — OXYCODONE HCL 5 MG PO TABS
5.0000 mg | ORAL_TABLET | ORAL | 0 refills | Status: DC | PRN
  Filled 2022-04-21: qty 10, 2d supply, fill #0

## 2022-04-21 MED ORDER — RIVAROXABAN 10 MG PO TABS
10.0000 mg | ORAL_TABLET | Freq: Every day | ORAL | 0 refills | Status: DC
  Filled 2022-04-21: qty 14, 14d supply, fill #0

## 2022-04-21 NOTE — H&P (View-Only) (Signed)
Gynecologic Oncology Return Clinic Visit  04/21/22  Reason for Visit: treatment planning  Treatment History: Oncology History Overview Note  HER2 negative   Uterine cancer ([place])  12/03/2021 Initial Diagnosis   Patient reports several month history of intermittent pelvic pain that she describes as discomfort or sometimes cramping, that has worsened with time.  She notes that this is tolerable.  She began having postmenopausal bleeding started on 6/17 with a few spots of blood.     12/29/2021 Imaging   1. Bilateral large multiloculated cystic lesions of the ovaries, concerning for ovarian malignancy. Recommend gynecologic consultation and pelvic ultrasound and/or pelvic MRI with contrast for further evaluation. 2. Large heterogeneous mass of the posterior pelvis likely arising from the lower uterus, possibly a fibroid, although malignancy is also a concern. Recommend attention on pelvic ultrasound or MRI as above. 3. Additional mass arising from the right uterine fundus which correlates with fibroid described on prior 2015 ultrasound. 4. Nonspecific small ground-glass nodule of the right lower lobe measuring 6 mm. Recommend follow-up chest CT in 6 months for further evaluation. 5. Aortic Atherosclerosis (ICD10-I70.0).   12/30/2021 Pathology Results   A. CERVIX, BIOPSY:  -  Scantly cellular specimen with predominantly blood, fibrin and acute inflammatory infiltrate but with scattered highly atypical cells highly suspicious for malignancy.   Note: A p16 shows strong positivity in these scattered cells; however, while p53 is mildly increased definitive strong overexpression or under expression is not identified.  These findings support the suspicion for malignancy; however, are insufficient for a definitive diagnosis.     01/03/2022 Imaging   MRI pelvis  1. Large mixed solid and cystic lesions of the bilateral ovaries, measuring 8.4 x 7.0 x 5.4 cm on the right and 10.3 x 8.7 x 8.1 cm on the  left, highly concerning for primary ovarian malignancy and contralateral ovarian metastasis.   2. Homogeneously enhancing mass arising from the right aspect of the uterine body and fundus measuring 6.0 x 5.5 x 4.3 cm, consistent with a uterine fibroid and similar to prior ultrasound dated 2015.   3. An additional, much more heterogeneous mass which appears to arise from the cervix or posterior lower uterine segment measuring 8.1 x 6.4 x 6.2 cm. This may reflect an additional fibroid with internal degeneration, however this was not clearly visualized on ultrasound dated 2015 and is highly suspicious for a cervical mass as significant enlargement of benign fibroids is not expected following menopause. This mass appears to closely abut and compress or perhaps even directly involve the adjacent rectum.   4.  No evidence of lymphadenopathy in the pelvis.   01/12/2022 PET scan   1. Hypermetabolic heterogeneous mass centered on the cervix/lower uterine segment is most consistent with primary gynecologic neoplasm. 2. Multiloculated complex bilateral adnexal cystic lesions with hypermetabolic soft tissue component likely reflect metastatic lesions. 3. No hypermetabolic abdominopelvic adenopathy. 4. No evidence of hypermetabolic metastatic disease in the chest or abdomen.   01/18/2022 Pathology Results   FINAL MICROSCOPIC DIAGNOSIS:   A. CERVICAL MASS, BIOPSIES:  - Poorly differentiated carcinoma with necrosis, consistent with high-grade serous carcinoma, see comment   B. CERVICAL MASS, EXCISION:  - Poorly differentiated carcinoma with necrosis, consistent with high-grade serous carcinoma see comment  - Edges of resected tissue fragments are positive for carcinoma   COMMENT:   A and B.   The tumor cells are positive for PAX8, ER and p16 immunostains.  Immunostain for p53 shows significant overexpression. Immunostain for CK5/6 shows weak focal  labeling while immunostain for  p63 is negative.  The  immunoprofile is consistent with above interpretation.  The carcinoma is likely endometrial or ovarian origin with secondary involvement of the cervix.     01/23/2022 Initial Diagnosis   Uterine cancer ([place])   01/23/2022 Cancer Staging   Staging form: Corpus Uteri - Carcinoma and Carcinosarcoma, AJCC 8th Edition - Clinical stage from 01/23/2022: FIGO Stage IIIA (cT3a, cN0, cM0) - Signed by [name], MD on 01/23/2022 Stage prefix: Initial diagnosis   01/31/2022 - 01/31/2022 Chemotherapy   Patient is on Treatment Plan : UTERINE ENDOMETRIAL Dostarlimab-gxly (500 mg) + Carboplatin (AUC 5) + Paclitaxel (175 mg/m2) q21d x 6 cycles / Dostarlimab-gxly (1000 mg) q42d x 6 cycles      01/31/2022 -  Chemotherapy   Patient is on Treatment Plan : UTERINE ENDOMETRIAL Dostarlimab-gxly (500 mg) + Carboplatin (AUC 5) + Paclitaxel (175 mg/m2) q21d x 6 cycles / Dostarlimab-gxly (1000 mg) q42d x 6 cycles      04/14/2022 PET scan   1. Significant interval positive response to therapy. Mildly hypermetabolic solid 5.5 x 2.8 cm posterior uterine cervix mass and mildly hypermetabolic bilateral adnexal mixed cystic and solid masses are all significantly decreased in size and metabolism. 2. No new or progressive hypermetabolic metastatic disease. 3. Low level FDG uptake associated with healing subacute sclerotic nondisplaced anterior left third, fourth and fifth rib fractures without associated discrete osseous lesions, correlate for history of interval injury in this location. 4. Horseshoe kidney.     Interval History: She received cycle 4 of carboplatin, taxol, dostarlimab on 10/17.   Energy level has been typically good.  She denies any vaginal bleeding or discharge since after her first cycle of chemotherapy.  With her fourth cycle, she had more pain, which she describes as intermittent, and mostly under her ribs and along her pelvic sides.  She reports normal bowel and bladder function.  Has some numbness in her  fingertips and toes.  She fell 2 days after having her port placed on her left side, had pain for a while along her left ribs which has resolved.  Past Medical/Surgical History: Past Medical History:  Diagnosis Date   Anxiety    Depression    GERD (gastroesophageal reflux disease)    Hypothyroidism    not on medications currently    Past Surgical History:  Procedure Laterality Date   CESAREAN SECTION     DILATION AND CURETTAGE OF UTERUS     61yo for SAB   IR IMAGING GUIDED PORT INSERTION  01/25/2022   LESION REMOVAL N/A 01/18/2022   Procedure: CERVICAL BIOPSIES;  Surgeon: [name], MD;  Location: [facility];  Service: Gynecology;  Laterality: N/A;    Family History  Problem Relation Age of Onset   Thyroid disease Mother    Breast cancer Mother        dx late 61s   Cancer Other        unk type, d. 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  Tobacco Use   Smoking status: Never   Smokeless tobacco: Never  Vaping Use   Vaping Use: Never used  Substance and Sexual Activity   Alcohol use: Never   Drug use: Never   Sexual activity: Not Currently  Other Topics Concern   Not on file  Social History Narrative   Not on file   Social Determinants of Health   Financial Resource Strain: Not on file  Food Insecurity: No Food Insecurity (04/26/2017)   Hunger Vital Sign    Worried About Running Out of Food in the Last Year: Never true    Ran Out of Food in the Last Year: Never true  Transportation Needs: Not on file  Physical Activity: Not on file  Stress: Not on file  Social Connections: Not on  file    Current Medications:  Current Outpatient Medications:    ibuprofen (ADVIL) 800 MG tablet, Take 1 tablet (800 mg total) by mouth every 8 (eight) hours as needed for moderate pain. For AFTER surgery only, Disp: 30 tablet, Rfl: 0   oxyCODONE (OXY IR/ROXICODONE) 5 MG immediate release tablet, Take 1 tablet (5 mg total) by mouth every 4 (four) hours as needed for severe pain. For AFTER surgery only, do not take and drive, Disp: 10 tablet, Rfl: 0   [START ON 05/05/2022] rivaroxaban (XARELTO) 10 MG TABS tablet, Take 1 tablet (10 mg total) by mouth daily. For AFTER surgery only, Disp: 14 tablet, Rfl: 0   senna-docusate (SENEXON-S) 8.6-50 MG tablet, Take 2 tablets by mouth at bedtime. For AFTER surgery, do not take if having diarrhea, Disp: 30 tablet, Rfl: 0   ALPRAZolam (XANAX) 0.25 MG tablet, Take 0.25 mg by mouth every 8 (eight) hours as needed for anxiety., Disp: , Rfl:    dexamethasone (DECADRON) 4 MG tablet, Take 2 tablets by mouth the night before and 2 tabs the morning of chemotherapy, every 3 weeks, for 6 cycles, Disp: 36 tablet, Rfl: 6  Review of Systems: + Pelvic pain, numbness Denies appetite changes, fevers, chills, fatigue, unexplained weight changes. Denies hearing loss, neck lumps or masses, mouth sores, ringing in ears or voice changes. Denies cough or wheezing.  Denies shortness of breath. Denies chest pain or palpitations. Denies leg swelling. Denies abdominal distention, pain, blood in stools, constipation, diarrhea, nausea, vomiting, or early satiety. Denies pain with intercourse, dysuria, frequency, hematuria or incontinence. Denies hot flashes, vaginal bleeding or vaginal discharge.   Denies joint pain, back pain or muscle pain/cramps. Denies itching, rash, or wounds. Denies dizziness, headaches or seizures. Denies swollen lymph nodes or glands, denies easy bruising or bleeding. Denies anxiety, depression, confusion, or decreased concentration.  Physical Exam: BP  (!) 158/77 (BP Location: Left Arm, Patient Position: Sitting) Comment: will inform Dr Berline Lopes  Pulse (!) 101   Temp 98.6 F (37 C) (Oral)   Resp 16   Ht 5' 3.39" (1.61 m)   Wt 135 lb (61.2 kg)   SpO2 100%   BMI 23.62 kg/m  General: Alert, oriented, no acute distress. HEENT: Normocephalic, atraumatic, sclera anicteric. Chest: Clear to auscultation bilaterally.  No wheezes or rhonchi. Cardiovascular: Regular rate and rhythm, no murmurs. Abdomen: soft, nontender.  Normoactive bowel sounds.  No masses or hepatosplenomegaly appreciated.   Extremities: Grossly normal range of motion.  Warm, well perfused.  No edema bilaterally. Skin: No rashes or lesions noted. Lymphatics: No cervical, supraclavicular, or inguinal adenopathy. GU: Normal appearing external genitalia without erythema, excoriation, or lesions.  Speculum  exam poorly tolerated.  Able to see approximately 5 cm of vagina which are atrophic.  Unable to visualize the cervix.  On bimanual exam, unable to palpate the cervix given intolerance of exam.  This was true also on rectovaginal exam.  Laboratory & Radiologic Studies: PET on 10/27: 1. Significant interval positive response to therapy. Mildly hypermetabolic solid 5.5 x 2.8 cm posterior uterine cervix mass and mildly hypermetabolic bilateral adnexal mixed cystic and solid masses are all significantly decreased in size and metabolism. 2. No new or progressive hypermetabolic metastatic disease. 3. Low level FDG uptake associated with healing subacute sclerotic nondisplaced anterior left third, fourth and fifth rib fractures without associated discrete osseous lesions, correlate for history of interval injury in this location. 4. Horseshoe kidney.   Assessment & Plan: Tiffany [name] is a 61 y.o. woman with at least Stage IIIA uterine serous carcinoma who presents for discussion of interval surgery after 4 cycles of neoadjuvant chemotherapy.  Patient is overall doing well and has had  significant improvement in symptoms after 4 cycles of neoadjuvant chemotherapy.  We discussed PET findings today which shows significant decrease in the size of her cervical/lower uterine segment mass as well as decreased avidity of this mass and of her bilateral adnexal masses.  Unfortunately, she did not tolerate an exam well today.  I think based on imaging, she is a good candidate for interval surgery at this time.  I discussed the unlikely event that without a preoperative exam, there is a risk that at the time of surgery, I do not think that her tumor is resectable still.  I think this is unlikely.  In terms of the surgery itself, we discussed plan for robotic approach with possible conversion to open based on intra-abdominal findings.  Given the size of her cervical mass, this may require at least a modified radical hysterectomy.  I discussed that this would mean removing some of the parametria as well as the uterosacral ligaments.  Depending on the dissection, this may put her at risk for bladder and bowel dysfunction.  If this was the case, plan would be for indwelling Foley catheter for 1 week.  We also discussed increased risk of VTE in the postoperative period after major surgery and in the setting of malignancy.  I have recommended 2 weeks of prophylactic anticoagulation in the event of MIS versus 4 weeks if open surgery.  The patient had questions about adjuvant treatment.  I stressed that she will very likely continue with the same chemotherapy regimen that she has been on but that other decisions (such as the use of any adjuvant radiation) decided based on final pathology.  We reviewed the plan for a robotic assisted hysterectomy versus modified radical hysterectomy with upper vaginectomy, bilateral salpingo-oophorectomy, tumor debulking including omentectomy, possible laparotomy. The risks of surgery were discussed in detail and she understands these to include infection; wound separation;  hernia; vaginal cuff separation, injury to adjacent organs such as bowel, bladder, blood vessels, ureters and nerves; bleeding which may require blood transfusion; anesthesia risk; thromboembolic events; possible death; unforeseen complications; possible need for re-exploration; medical complications such as heart attack, stroke, pleural effusion and pneumonia.  I also reviewed specifically the risks of radical hysterectomy including bowel and bladder dysfunction, fistula development, and ureteral stricture.The patient will receive DVT and antibiotic prophylaxis as indicated. She voiced a clear understanding. She had the opportunity to ask questions. Perioperative instructions were reviewed with her. Prescriptions for post-op medications were sent to her pharmacy of  choice.  Per the patient's request, I called Dr. Pricilla Riffle after her visit and discussed upcoming plan for surgery.  38 minutes of total time was spent for this patient encounter, including preparation, face-to-face counseling with the patient and coordination of care, and documentation of the encounter.  Jeral Pinch, MD  Division of Gynecologic Oncology  Department of Obstetrics and Gynecology  [facility]

## 2022-04-21 NOTE — Patient Instructions (Signed)
Preparing for your Surgery   Plan for surgery on May 03, 2022 with Dr. Jeral Pinch at [place] will be scheduled for robotic assisted laparoscopic modified radical hysterectomy (removal of the uterus and cervix), bilateral salpingo-oophorectomy (removal of both ovaries and fallopian tubes), omentectomy, tumor debulking, possible laparotomy (larger incision on your abdomen if needed).   Pre-operative Testing -You will receive a phone call from presurgical testing at [facility] to arrange for a pre-operative appointment and lab work.   -Bring your insurance card, copy of an advanced directive if applicable, medication list   -At that visit, you will be asked to sign a consent for a possible blood transfusion in case a transfusion becomes necessary during surgery.  The need for a blood transfusion is rare but having consent is a necessary part of your care.      -You should not be taking blood thinners or aspirin at least ten days prior to surgery unless instructed by your surgeon.   -Do not take supplements such as fish oil (omega 3), red yeast rice, turmeric before your surgery. You want to avoid medications with aspirin in them including headache powders such as BC or Goody's), Excedrin migraine.   Day Before Surgery at [place] will be asked to take in a light diet the day before surgery. You will be advised you can have clear liquids up until 3 hours before your surgery.     Eat a light diet the day before surgery.  Examples including soups, broths, toast, yogurt, mashed potatoes.  AVOID GAS PRODUCING FOODS. Things to avoid include carbonated beverages (fizzy beverages, sodas), raw fruits and raw vegetables (uncooked), or beans.    If your bowels are filled with gas, your surgeon will have difficulty visualizing your pelvic organs which increases your surgical risks.   Your role in recovery Your role is to become active as soon as directed by your  doctor, while still giving yourself time to heal.  Rest when you feel tired. You will be asked to do the following in order to speed your recovery:   - Cough and breathe deeply. This helps to clear and expand your lungs and can prevent pneumonia after surgery.  - [place]. Do mild physical activity. Walking or moving your legs help your circulation and body functions return to normal. Do not try to get up or walk alone the first time after surgery.   -If you develop swelling on one leg or the other, pain in the back of your leg, redness/warmth in one of your legs, please call the office or go to the Emergency Room to have a doppler to rule out a blood clot. For shortness of breath, chest pain-seek care in the Emergency Room as soon as possible. - Actively manage your pain. Managing your pain lets you move in comfort. We will ask you to rate your pain on a scale of zero to 10. It is your responsibility to tell your doctor or nurse where and how much you hurt so your pain can be treated.   Special Considerations -If you are diabetic, you may be placed on insulin after surgery to have closer control over your blood sugars to promote healing and recovery.  This does not mean that you will be discharged on insulin.  If applicable, your oral antidiabetics will be resumed when you are tolerating a solid diet.   -Your final pathology results from surgery should be available around  one week after surgery and the results will be relayed to you when available.   -Dr. Lahoma Crocker is the surgeon that assists your GYN Oncologist with surgery.  If you end up staying the night, the next day after your surgery you will either see Dr. Berline Lopes, Dr. Ernestina Patches, or Dr. Lahoma Crocker.   -FMLA forms can be faxed to [phone] and please allow 5-7 business days for completion.   Pain Management After Surgery -You have been prescribed your pain medication and bowel regimen medications before  surgery so that you can have these available when you are discharged from the hospital. The pain medication is for use ONLY AFTER surgery and a new prescription will not be given.    -Make sure that you have Tylenol and Ibuprofen IF YOU ARE ABLE TO TAKE THESE MEDICATIONS at home to use on a regular basis after surgery for pain control. We recommend alternating the medications every hour to six hours since they work differently and are processed in the body differently for pain relief.   -Review the attached handout on narcotic use and their risks and side effects.    Bowel Regimen -You have been prescribed Sennakot-S to take nightly to prevent constipation especially if you are taking the narcotic pain medication intermittently.  It is important to prevent constipation and drink adequate amounts of liquids. You can stop taking this medication when you are not taking pain medication and you are back on your normal bowel routine.   Risks of Surgery Risks of surgery are low but include bleeding, infection, damage to surrounding structures, re-operation, blood clots, and very rarely death.     Blood Transfusion Information (For the consent to be signed before surgery)   We will be checking your blood type before surgery so in case of emergencies, we will know what type of blood you would need.                                             WHAT IS A BLOOD TRANSFUSION?   A transfusion is the replacement of blood or some of its parts. Blood is made up of multiple cells which provide different functions. Red blood cells carry oxygen and are used for blood loss replacement. White blood cells fight against infection. Platelets control bleeding. Plasma helps clot blood. Other blood products are available for specialized needs, such as hemophilia or other clotting disorders. BEFORE THE TRANSFUSION  Who gives blood for transfusions?  You may be able to donate blood to be used at a later date on yourself  (autologous donation). Relatives can be asked to donate blood. This is generally not any safer than if you have received blood from a stranger. The same precautions are taken to ensure safety when a relative's blood is donated. Healthy volunteers who are fully evaluated to make sure their blood is safe. This is blood bank blood. Transfusion therapy is the safest it has ever been in the practice of medicine. Before blood is taken from a donor, a complete history is taken to make sure that person has no history of diseases nor engages in risky social behavior (examples are intravenous drug use or sexual activity with multiple partners). The donor's travel history is screened to minimize risk of transmitting infections, such as malaria. The donated blood is tested for signs of infectious diseases, such as HIV and  hepatitis. The blood is then tested to be sure it is compatible with you in order to minimize the chance of a transfusion reaction. If you or a relative donates blood, this is often done in anticipation of surgery and is not appropriate for emergency situations. It takes many days to process the donated blood. RISKS AND COMPLICATIONS Although transfusion therapy is very safe and saves many lives, the main dangers of transfusion include:  Getting an infectious disease. Developing a transfusion reaction. This is an allergic reaction to something in the blood you were given. Every precaution is taken to prevent this. The decision to have a blood transfusion has been considered carefully by your caregiver before blood is given. Blood is not given unless the benefits outweigh the risks.   AFTER SURGERY INSTRUCTIONS   Return to work: 4-6 weeks if applicable   We will plan to send you home with 2 weeks of a blood thinner tablet to help prevent blood clots after surgery. THIS WILL BE FOR AFTER SURGERY ONLY AND NEEDS TO BE TAKEN AT THE SAME TIME EACH DAY.   Activity: 1. Be up and out of the bed during  the day.  Take a nap if needed.  You may walk up steps but be careful and use the hand rail.  Stair climbing will tire you more than you think, you may need to stop part way and rest.    2. No lifting or straining for 6 weeks over 10 pounds. No pushing, pulling, straining for 6 weeks.   3. No driving for around 1 week(s).  Do not drive if you are taking narcotic pain medicine and make sure that your reaction time has returned.    4. You can shower as soon as the next day after surgery. Shower daily.  Use your regular soap and water (not directly on the incision) and pat your incision(s) dry afterwards; don't rub.  No tub baths or submerging your body in water until cleared by your surgeon. If you have the soap that was given to you by pre-surgical testing that was used before surgery, you do not need to use it afterwards because this can irritate your incisions.    5. No sexual activity and nothing in the vagina for 8-10 weeks.   6. You may experience a small amount of clear drainage from your incisions, which is normal.  If the drainage persists, increases, or changes color please call the office.   7. Do not use creams, lotions, or ointments such as neosporin on your incisions after surgery until advised by your surgeon because they can cause removal of the dermabond glue on your incisions.     8. You may experience vaginal spotting after surgery or around the 6-8 week mark from surgery when the stitches at the top of the vagina begin to dissolve.  The spotting is normal but if you experience heavy bleeding, call our office.   9. Take Tylenol or ibuprofen first for pain if you are able to take these medications and only use narcotic pain medication for severe pain not relieved by the Tylenol or Ibuprofen.  Monitor your Tylenol intake to a max of 4,000 mg in a 24 hour period. You can alternate these medications after surgery.   Diet: 1. Low sodium Heart Healthy Diet is recommended but you are  cleared to resume your normal (before surgery) diet after your procedure.   2. It is safe to use a laxative, such as Miralax or Colace,  if you have difficulty moving your bowels. You have been prescribed Sennakot-S to take at bedtime every evening after surgery to keep bowel movements regular and to prevent constipation.     Wound Care: 1. Keep clean and dry.  Shower daily.   Reasons to call the Doctor: Fever - Oral temperature greater than 100.4 degrees Fahrenheit Foul-smelling vaginal discharge Difficulty urinating Nausea and vomiting Increased pain at the site of the incision that is unrelieved with pain medicine. Difficulty breathing with or without chest pain New calf pain especially if only on one side Sudden, continuing increased vaginal bleeding with or without clots.   Contacts: For questions or concerns you should contact:   Dr. Jeral Pinch at [phone]   Joylene John, NP at [phone]   After Hours: call [phone] and have the GYN Oncologist paged/contacted (after 5 pm or on the weekends).   Messages sent via mychart are for non-urgent matters and are not responded to after hours so for urgent needs, please call the after hours number.

## 2022-04-21 NOTE — Telephone Encounter (Addendum)
Called patient to let her know of cost of Xarelto prescription. No answer. No voicemail left. Sent mychart message with information.

## 2022-04-21 NOTE — Progress Notes (Signed)
Patient here for follow up with Dr. Jeral Pinch and for a pre-operative discussion prior to her scheduled surgery on May 03, 2022. She is scheduled for robotic assisted laparoscopic modified radical hysterectomy, bilateral salpingo-oophorectomy, omentectomy, tumor debulking, possible laparotomy. The surgery was discussed in detail.  See after visit summary for additional details. Visual aids used to discuss items related to surgery.   Discussed post-op pain management in detail including the aspects of the enhanced recovery pathway.  Advised her that a new prescription would be sent in for oxycodone and it is only to be used for after her upcoming surgery.  We discussed the use of tylenol post-op and to monitor for a maximum of 4,000 mg in a 24 hour period.  Also prescribed sennakot to be used after surgery and to hold if having loose stools.  Discussed bowel regimen in detail.     Discussed measures to take at home to prevent DVT including frequent mobility.  Reportable signs and symptoms of DVT discussed. Post-operative instructions discussed and expectations for after surgery. Incisional care discussed as well including reportable signs and symptoms including erythema, drainage, wound separation.     5 minutes spent with the patient.  Verbalizing understanding of material discussed. No needs or concerns voiced at the end of the visit.   Advised patient to call for any needs.  Advised that her post-operative medications had been prescribed and could be picked up at any time. Discussed xarelto use post-op and our office will check the cost prior to surgery.   This appointment is included in the global surgical bundle as pre-operative teaching and has no charge.

## 2022-04-21 NOTE — Progress Notes (Signed)
Gynecologic Oncology Return Clinic Visit  04/21/22  Reason for Visit: treatment planning  Treatment History: Oncology History Overview Note  HER2 negative   Uterine cancer ([name])  12/03/2021 Initial Diagnosis   Patient reports several month history of intermittent pelvic pain that she describes as discomfort or sometimes cramping, that has worsened with time.  She notes that this is tolerable.  She began having postmenopausal bleeding started on 6/17 with a few spots of blood.     12/29/2021 Imaging   1. Bilateral large multiloculated cystic lesions of the ovaries, concerning for ovarian malignancy. Recommend gynecologic consultation and pelvic ultrasound and/or pelvic MRI with contrast for further evaluation. 2. Large heterogeneous mass of the posterior pelvis likely arising from the lower uterus, possibly a fibroid, although malignancy is also a concern. Recommend attention on pelvic ultrasound or MRI as above. 3. Additional mass arising from the right uterine fundus which correlates with fibroid described on prior 2015 ultrasound. 4. Nonspecific small ground-glass nodule of the right lower lobe measuring 6 mm. Recommend follow-up chest CT in 6 months for further evaluation. 5. Aortic Atherosclerosis (ICD10-I70.0).   12/30/2021 Pathology Results   A. CERVIX, BIOPSY:  -  Scantly cellular specimen with predominantly blood, fibrin and acute inflammatory infiltrate but with scattered highly atypical cells highly suspicious for malignancy.   Note: A p16 shows strong positivity in these scattered cells; however, while p53 is mildly increased definitive strong overexpression or under expression is not identified.  These findings support the suspicion for malignancy; however, are insufficient for a definitive diagnosis.     01/03/2022 Imaging   MRI pelvis  1. Large mixed solid and cystic lesions of the bilateral ovaries, measuring 8.4 x 7.0 x 5.4 cm on the right and 10.3 x 8.7 x 8.1 cm on the  left, highly concerning for primary ovarian malignancy and contralateral ovarian metastasis.   2. Homogeneously enhancing mass arising from the right aspect of the uterine body and fundus measuring 6.0 x 5.5 x 4.3 cm, consistent with a uterine fibroid and similar to prior ultrasound dated 2015.   3. An additional, much more heterogeneous mass which appears to arise from the cervix or posterior lower uterine segment measuring 8.1 x 6.4 x 6.2 cm. This may reflect an additional fibroid with internal degeneration, however this was not clearly visualized on ultrasound dated 2015 and is highly suspicious for a cervical mass as significant enlargement of benign fibroids is not expected following menopause. This mass appears to closely abut and compress or perhaps even directly involve the adjacent rectum.   4.  No evidence of lymphadenopathy in the pelvis.   01/12/2022 PET scan   1. Hypermetabolic heterogeneous mass centered on the cervix/lower uterine segment is most consistent with primary gynecologic neoplasm. 2. Multiloculated complex bilateral adnexal cystic lesions with hypermetabolic soft tissue component likely reflect metastatic lesions. 3. No hypermetabolic abdominopelvic adenopathy. 4. No evidence of hypermetabolic metastatic disease in the chest or abdomen.   01/18/2022 Pathology Results   FINAL MICROSCOPIC DIAGNOSIS:   A. CERVICAL MASS, BIOPSIES:  - Poorly differentiated carcinoma with necrosis, consistent with high-grade serous carcinoma, see comment   B. CERVICAL MASS, EXCISION:  - Poorly differentiated carcinoma with necrosis, consistent with high-grade serous carcinoma see comment  - Edges of resected tissue fragments are positive for carcinoma   COMMENT:   A and B.   The tumor cells are positive for PAX8, ER and p16 immunostains.  Immunostain for p53 shows significant overexpression. Immunostain for CK5/6 shows weak focal  labeling while immunostain for  p63 is negative.  The  immunoprofile is consistent with above interpretation.  The carcinoma is likely endometrial or ovarian origin with secondary involvement of the cervix.     01/23/2022 Initial Diagnosis   Uterine cancer ([place])   01/23/2022 Cancer Staging   Staging form: Corpus Uteri - Carcinoma and Carcinosarcoma, AJCC 8th Edition - Clinical stage from 01/23/2022: FIGO Stage IIIA (cT3a, cN0, cM0) - Signed by [name], MD on 01/23/2022 Stage prefix: Initial diagnosis   01/31/2022 - 01/31/2022 Chemotherapy   Patient is on Treatment Plan : UTERINE ENDOMETRIAL Dostarlimab-gxly (500 mg) + Carboplatin (AUC 5) + Paclitaxel (175 mg/m2) q21d x 6 cycles / Dostarlimab-gxly (1000 mg) q42d x 6 cycles      01/31/2022 -  Chemotherapy   Patient is on Treatment Plan : UTERINE ENDOMETRIAL Dostarlimab-gxly (500 mg) + Carboplatin (AUC 5) + Paclitaxel (175 mg/m2) q21d x 6 cycles / Dostarlimab-gxly (1000 mg) q42d x 6 cycles      04/14/2022 PET scan   1. Significant interval positive response to therapy. Mildly hypermetabolic solid 5.5 x 2.8 cm posterior uterine cervix mass and mildly hypermetabolic bilateral adnexal mixed cystic and solid masses are all significantly decreased in size and metabolism. 2. No new or progressive hypermetabolic metastatic disease. 3. Low level FDG uptake associated with healing subacute sclerotic nondisplaced anterior left third, fourth and fifth rib fractures without associated discrete osseous lesions, correlate for history of interval injury in this location. 4. Horseshoe kidney.     Interval History: She received cycle 4 of carboplatin, taxol, dostarlimab on 10/17.   Energy level has been typically good.  She denies any vaginal bleeding or discharge since after her first cycle of chemotherapy.  With her fourth cycle, she had more pain, which she describes as intermittent, and mostly under her ribs and along her pelvic sides.  She reports normal bowel and bladder function.  Has some numbness in her  fingertips and toes.  She fell 2 days after having her port placed on her left side, had pain for a while along her left ribs which has resolved.  Past Medical/Surgical History: Past Medical History:  Diagnosis Date   Anxiety    Depression    GERD (gastroesophageal reflux disease)    Hypothyroidism    not on medications currently    Past Surgical History:  Procedure Laterality Date   CESAREAN SECTION     DILATION AND CURETTAGE OF UTERUS     61yo for SAB   IR IMAGING GUIDED PORT INSERTION  01/25/2022   LESION REMOVAL N/A 01/18/2022   Procedure: CERVICAL BIOPSIES;  Surgeon: [name], MD;  Location: [facility];  Service: Gynecology;  Laterality: N/A;    Family History  Problem Relation Age of Onset   Thyroid disease Mother    Breast cancer Mother        dx late 37s   Cancer Other        unk type, d. 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  Tobacco Use   Smoking status: Never   Smokeless tobacco: Never  Vaping Use   Vaping Use: Never used  Substance and Sexual Activity   Alcohol use: Never   Drug use: Never   Sexual activity: Not Currently  Other Topics Concern   Not on file  Social History Narrative   Not on file   Social Determinants of Health   Financial Resource Strain: Not on file  Food Insecurity: No Food Insecurity (04/26/2017)   Hunger Vital Sign    Worried About Running Out of Food in the Last Year: Never true    Ran Out of Food in the Last Year: Never true  Transportation Needs: Not on file  Physical Activity: Not on file  Stress: Not on file  Social Connections: Not on  file    Current Medications:  Current Outpatient Medications:    ibuprofen (ADVIL) 800 MG tablet, Take 1 tablet (800 mg total) by mouth every 8 (eight) hours as needed for moderate pain. For AFTER surgery only, Disp: 30 tablet, Rfl: 0   oxyCODONE (OXY IR/ROXICODONE) 5 MG immediate release tablet, Take 1 tablet (5 mg total) by mouth every 4 (four) hours as needed for severe pain. For AFTER surgery only, do not take and drive, Disp: 10 tablet, Rfl: 0   [START ON 05/05/2022] rivaroxaban (XARELTO) 10 MG TABS tablet, Take 1 tablet (10 mg total) by mouth daily. For AFTER surgery only, Disp: 14 tablet, Rfl: 0   senna-docusate (SENEXON-S) 8.6-50 MG tablet, Take 2 tablets by mouth at bedtime. For AFTER surgery, do not take if having diarrhea, Disp: 30 tablet, Rfl: 0   ALPRAZolam (XANAX) 0.25 MG tablet, Take 0.25 mg by mouth every 8 (eight) hours as needed for anxiety., Disp: , Rfl:    dexamethasone (DECADRON) 4 MG tablet, Take 2 tablets by mouth the night before and 2 tabs the morning of chemotherapy, every 3 weeks, for 6 cycles, Disp: 36 tablet, Rfl: 6  Review of Systems: + Pelvic pain, numbness Denies appetite changes, fevers, chills, fatigue, unexplained weight changes. Denies hearing loss, neck lumps or masses, mouth sores, ringing in ears or voice changes. Denies cough or wheezing.  Denies shortness of breath. Denies chest pain or palpitations. Denies leg swelling. Denies abdominal distention, pain, blood in stools, constipation, diarrhea, nausea, vomiting, or early satiety. Denies pain with intercourse, dysuria, frequency, hematuria or incontinence. Denies hot flashes, vaginal bleeding or vaginal discharge.   Denies joint pain, back pain or muscle pain/cramps. Denies itching, rash, or wounds. Denies dizziness, headaches or seizures. Denies swollen lymph nodes or glands, denies easy bruising or bleeding. Denies anxiety, depression, confusion, or decreased concentration.  Physical Exam: BP  (!) 158/77 (BP Location: Left Arm, Patient Position: Sitting) Comment: will inform Dr Berline Lopes  Pulse (!) 101   Temp 98.6 F (37 C) (Oral)   Resp 16   Ht 5' 3.39" (1.61 m)   Wt 135 lb (61.2 kg)   SpO2 100%   BMI 23.62 kg/m  General: Alert, oriented, no acute distress. HEENT: Normocephalic, atraumatic, sclera anicteric. Chest: Clear to auscultation bilaterally.  No wheezes or rhonchi. Cardiovascular: Regular rate and rhythm, no murmurs. Abdomen: soft, nontender.  Normoactive bowel sounds.  No masses or hepatosplenomegaly appreciated.   Extremities: Grossly normal range of motion.  Warm, well perfused.  No edema bilaterally. Skin: No rashes or lesions noted. Lymphatics: No cervical, supraclavicular, or inguinal adenopathy. GU: Normal appearing external genitalia without erythema, excoriation, or lesions.  Speculum  exam poorly tolerated.  Able to see approximately 5 cm of vagina which are atrophic.  Unable to visualize the cervix.  On bimanual exam, unable to palpate the cervix given intolerance of exam.  This was true also on rectovaginal exam.  Laboratory & Radiologic Studies: PET on 10/27: 1. Significant interval positive response to therapy. Mildly hypermetabolic solid 5.5 x 2.8 cm posterior uterine cervix mass and mildly hypermetabolic bilateral adnexal mixed cystic and solid masses are all significantly decreased in size and metabolism. 2. No new or progressive hypermetabolic metastatic disease. 3. Low level FDG uptake associated with healing subacute sclerotic nondisplaced anterior left third, fourth and fifth rib fractures without associated discrete osseous lesions, correlate for history of interval injury in this location. 4. Horseshoe kidney.   Assessment & Plan: Tiffany [name] is a 61 y.o. woman with at least Stage IIIA uterine serous carcinoma who presents for discussion of interval surgery after 4 cycles of neoadjuvant chemotherapy.  Patient is overall doing well and has had  significant improvement in symptoms after 4 cycles of neoadjuvant chemotherapy.  We discussed PET findings today which shows significant decrease in the size of her cervical/lower uterine segment mass as well as decreased avidity of this mass and of her bilateral adnexal masses.  Unfortunately, she did not tolerate an exam well today.  I think based on imaging, she is a good candidate for interval surgery at this time.  I discussed the unlikely event that without a preoperative exam, there is a risk that at the time of surgery, I do not think that her tumor is resectable still.  I think this is unlikely.  In terms of the surgery itself, we discussed plan for robotic approach with possible conversion to open based on intra-abdominal findings.  Given the size of her cervical mass, this may require at least a modified radical hysterectomy.  I discussed that this would mean removing some of the parametria as well as the uterosacral ligaments.  Depending on the dissection, this may put her at risk for bladder and bowel dysfunction.  If this was the case, plan would be for indwelling Foley catheter for 1 week.  We also discussed increased risk of VTE in the postoperative period after major surgery and in the setting of malignancy.  I have recommended 2 weeks of prophylactic anticoagulation in the event of MIS versus 4 weeks if open surgery.  The patient had questions about adjuvant treatment.  I stressed that she will very likely continue with the same chemotherapy regimen that she has been on but that other decisions (such as the use of any adjuvant radiation) decided based on final pathology.  We reviewed the plan for a robotic assisted hysterectomy versus modified radical hysterectomy with upper vaginectomy, bilateral salpingo-oophorectomy, tumor debulking including omentectomy, possible laparotomy. The risks of surgery were discussed in detail and she understands these to include infection; wound separation;  hernia; vaginal cuff separation, injury to adjacent organs such as bowel, bladder, blood vessels, ureters and nerves; bleeding which may require blood transfusion; anesthesia risk; thromboembolic events; possible death; unforeseen complications; possible need for re-exploration; medical complications such as heart attack, stroke, pleural effusion and pneumonia.  I also reviewed specifically the risks of radical hysterectomy including bowel and bladder dysfunction, fistula development, and ureteral stricture.The patient will receive DVT and antibiotic prophylaxis as indicated. She voiced a clear understanding. She had the opportunity to ask questions. Perioperative instructions were reviewed with her. Prescriptions for post-op medications were sent to her pharmacy of  choice.  Per the patient's request, I called Dr. Pricilla Riffle after her visit and discussed upcoming plan for surgery.  38 minutes of total time was spent for this patient encounter, including preparation, face-to-face counseling with the patient and coordination of care, and documentation of the encounter.  Jeral Pinch, MD  Division of Gynecologic Oncology  Department of Obstetrics and Gynecology  [facility]

## 2022-04-21 NOTE — Patient Instructions (Addendum)
Preparing for your Surgery  Plan for surgery on May 03, 2022 with Dr. Jeral Pinch at [place] will be scheduled for robotic assisted laparoscopic modified radical hysterectomy (removal of the uterus and cervix), bilateral salpingo-oophorectomy (removal of both ovaries and fallopian tubes), omentectomy, tumor debulking, possible laparotomy (larger incision on your abdomen if needed).  Pre-operative Testing -You will receive a phone call from presurgical testing at [facility] to arrange for a pre-operative appointment and lab work.  -Bring your insurance card, copy of an advanced directive if applicable, medication list  -At that visit, you will be asked to sign a consent for a possible blood transfusion in case a transfusion becomes necessary during surgery.  The need for a blood transfusion is rare but having consent is a necessary part of your care.     -You should not be taking blood thinners or aspirin at least ten days prior to surgery unless instructed by your surgeon.  -Do not take supplements such as fish oil (omega 3), red yeast rice, turmeric before your surgery. You want to avoid medications with aspirin in them including headache powders such as BC or Goody's), Excedrin migraine.  Day Before Surgery at [place] will be asked to take in a light diet the day before surgery. You will be advised you can have clear liquids up until 3 hours before your surgery.    Eat a light diet the day before surgery.  Examples including soups, broths, toast, yogurt, mashed potatoes.  AVOID GAS PRODUCING FOODS. Things to avoid include carbonated beverages (fizzy beverages, sodas), raw fruits and raw vegetables (uncooked), or beans.   If your bowels are filled with gas, your surgeon will have difficulty visualizing your pelvic organs which increases your surgical risks.  Your role in recovery Your role is to become active as soon as directed by your doctor, while  still giving yourself time to heal.  Rest when you feel tired. You will be asked to do the following in order to speed your recovery:  - Cough and breathe deeply. This helps to clear and expand your lungs and can prevent pneumonia after surgery.  - [place]. Do mild physical activity. Walking or moving your legs help your circulation and body functions return to normal. Do not try to get up or walk alone the first time after surgery.   -If you develop swelling on one leg or the other, pain in the back of your leg, redness/warmth in one of your legs, please call the office or go to the Emergency Room to have a doppler to rule out a blood clot. For shortness of breath, chest pain-seek care in the Emergency Room as soon as possible. - Actively manage your pain. Managing your pain lets you move in comfort. We will ask you to rate your pain on a scale of zero to 10. It is your responsibility to tell your doctor or nurse where and how much you hurt so your pain can be treated.  Special Considerations -If you are diabetic, you may be placed on insulin after surgery to have closer control over your blood sugars to promote healing and recovery.  This does not mean that you will be discharged on insulin.  If applicable, your oral antidiabetics will be resumed when you are tolerating a solid diet.  -Your final pathology results from surgery should be available around one week after surgery and the results will be relayed to you when  available.  -Dr. Lahoma Crocker is the surgeon that assists your GYN Oncologist with surgery.  If you end up staying the night, the next day after your surgery you will either see Dr. Berline Lopes, Dr. Ernestina Patches, or Dr. Lahoma Crocker.  -FMLA forms can be faxed to [phone] and please allow 5-7 business days for completion.  Pain Management After Surgery -You have been prescribed your pain medication and bowel regimen medications before surgery so that you  can have these available when you are discharged from the hospital. The pain medication is for use ONLY AFTER surgery and a new prescription will not be given.   -Make sure that you have Tylenol and Ibuprofen IF YOU ARE ABLE TO TAKE THESE MEDICATIONS at home to use on a regular basis after surgery for pain control. We recommend alternating the medications every hour to six hours since they work differently and are processed in the body differently for pain relief.  -Review the attached handout on narcotic use and their risks and side effects.   Bowel Regimen -You have been prescribed Sennakot-S to take nightly to prevent constipation especially if you are taking the narcotic pain medication intermittently.  It is important to prevent constipation and drink adequate amounts of liquids. You can stop taking this medication when you are not taking pain medication and you are back on your normal bowel routine.  Risks of Surgery Risks of surgery are low but include bleeding, infection, damage to surrounding structures, re-operation, blood clots, and very rarely death.   Blood Transfusion Information (For the consent to be signed before surgery)  We will be checking your blood type before surgery so in case of emergencies, we will know what type of blood you would need.                                            WHAT IS A BLOOD TRANSFUSION?  A transfusion is the replacement of blood or some of its parts. Blood is made up of multiple cells which provide different functions. Red blood cells carry oxygen and are used for blood loss replacement. White blood cells fight against infection. Platelets control bleeding. Plasma helps clot blood. Other blood products are available for specialized needs, such as hemophilia or other clotting disorders. BEFORE THE TRANSFUSION  Who gives blood for transfusions?  You may be able to donate blood to be used at a later date on yourself (autologous  donation). Relatives can be asked to donate blood. This is generally not any safer than if you have received blood from a stranger. The same precautions are taken to ensure safety when a relative's blood is donated. Healthy volunteers who are fully evaluated to make sure their blood is safe. This is blood bank blood. Transfusion therapy is the safest it has ever been in the practice of medicine. Before blood is taken from a donor, a complete history is taken to make sure that person has no history of diseases nor engages in risky social behavior (examples are intravenous drug use or sexual activity with multiple partners). The donor's travel history is screened to minimize risk of transmitting infections, such as malaria. The donated blood is tested for signs of infectious diseases, such as HIV and hepatitis. The blood is then tested to be sure it is compatible with you in order to minimize the chance of a transfusion reaction. If  you or a relative donates blood, this is often done in anticipation of surgery and is not appropriate for emergency situations. It takes many days to process the donated blood. RISKS AND COMPLICATIONS Although transfusion therapy is very safe and saves many lives, the main dangers of transfusion include:  Getting an infectious disease. Developing a transfusion reaction. This is an allergic reaction to something in the blood you were given. Every precaution is taken to prevent this. The decision to have a blood transfusion has been considered carefully by your caregiver before blood is given. Blood is not given unless the benefits outweigh the risks.  AFTER SURGERY INSTRUCTIONS  Return to work: 4-6 weeks if applicable  We will plan to send you home with 2 weeks of a blood thinner tablet to help prevent blood clots after surgery. THIS WILL BE FOR AFTER SURGERY ONLY AND NEEDS TO BE TAKEN AT THE SAME TIME EACH DAY.  Activity: 1. Be up and out of the bed during the day.  Take a  nap if needed.  You may walk up steps but be careful and use the hand rail.  Stair climbing will tire you more than you think, you may need to stop part way and rest.   2. No lifting or straining for 6 weeks over 10 pounds. No pushing, pulling, straining for 6 weeks.  3. No driving for around 1 week(s).  Do not drive if you are taking narcotic pain medicine and make sure that your reaction time has returned.   4. You can shower as soon as the next day after surgery. Shower daily.  Use your regular soap and water (not directly on the incision) and pat your incision(s) dry afterwards; don't rub.  No tub baths or submerging your body in water until cleared by your surgeon. If you have the soap that was given to you by pre-surgical testing that was used before surgery, you do not need to use it afterwards because this can irritate your incisions.   5. No sexual activity and nothing in the vagina for 8-10 weeks.  6. You may experience a small amount of clear drainage from your incisions, which is normal.  If the drainage persists, increases, or changes color please call the office.  7. Do not use creams, lotions, or ointments such as neosporin on your incisions after surgery until advised by your surgeon because they can cause removal of the dermabond glue on your incisions.    8. You may experience vaginal spotting after surgery or around the 6-8 week mark from surgery when the stitches at the top of the vagina begin to dissolve.  The spotting is normal but if you experience heavy bleeding, call our office.  9. Take Tylenol or ibuprofen first for pain if you are able to take these medications and only use narcotic pain medication for severe pain not relieved by the Tylenol or Ibuprofen.  Monitor your Tylenol intake to a max of 4,000 mg in a 24 hour period. You can alternate these medications after surgery.  Diet: 1. Low sodium Heart Healthy Diet is recommended but you are cleared to resume your normal  (before surgery) diet after your procedure.  2. It is safe to use a laxative, such as Miralax or Colace, if you have difficulty moving your bowels. You have been prescribed Sennakot-S to take at bedtime every evening after surgery to keep bowel movements regular and to prevent constipation.    Wound Care: 1. Keep clean and dry.  Shower daily.  Reasons to call the Doctor: Fever - Oral temperature greater than 100.4 degrees Fahrenheit Foul-smelling vaginal discharge Difficulty urinating Nausea and vomiting Increased pain at the site of the incision that is unrelieved with pain medicine. Difficulty breathing with or without chest pain New calf pain especially if only on one side Sudden, continuing increased vaginal bleeding with or without clots.   Contacts: For questions or concerns you should contact:  Dr. Jeral Pinch at [phone]  Joylene John, NP at [phone]  After Hours: call [phone] and have the GYN Oncologist paged/contacted (after 5 pm or on the weekends).  Messages sent via mychart are for non-urgent matters and are not responded to after hours so for urgent needs, please call the after hours number.

## 2022-04-22 ENCOUNTER — Other Ambulatory Visit: Payer: Self-pay

## 2022-04-24 ENCOUNTER — Other Ambulatory Visit: Payer: Self-pay | Admitting: Hematology and Oncology

## 2022-04-24 ENCOUNTER — Telehealth: Payer: Self-pay

## 2022-04-24 NOTE — Telephone Encounter (Signed)
Called and told appts on 11/7 canceled at [facility]. She verbalized understanding.

## 2022-04-25 ENCOUNTER — Ambulatory Visit: Payer: No Typology Code available for payment source

## 2022-04-25 ENCOUNTER — Other Ambulatory Visit: Payer: No Typology Code available for payment source

## 2022-04-25 ENCOUNTER — Other Ambulatory Visit (HOSPITAL_COMMUNITY): Payer: Self-pay

## 2022-04-25 ENCOUNTER — Ambulatory Visit: Payer: No Typology Code available for payment source | Admitting: Hematology and Oncology

## 2022-04-27 NOTE — Patient Instructions (Signed)
DUE TO COVID-19 ONLY TWO VISITORS  (aged 60 and older)  ARE ALLOWED TO COME WITH YOU AND STAY IN THE WAITING ROOM ONLY DURING PRE OP AND PROCEDURE.   **NO VISITORS ARE ALLOWED IN THE SHORT STAY AREA OR RECOVERY ROOM!!**  IF YOU WILL BE ADMITTED INTO THE HOSPITAL YOU ARE ALLOWED ONLY FOUR SUPPORT PEOPLE DURING VISITATION HOURS ONLY (7 AM -8PM)   The support person(s) must pass our screening, gel in and out, and wear a mask at all times, including in the patient's room. Patients must also wear a mask when staff or their support person are in the room. Visitors GUEST BADGE MUST BE WORN VISIBLY  One adult visitor may remain with you overnight and MUST be in the room by 8 P.M.     Your procedure is scheduled on: 05/03/22   Report to [facility] Main Entrance    Report to admitting at : 10:45 AM   Call this number if you have problems the morning of surgery [phone]   Eat a light diet the day before surgery.  Examples including soups, broths, toast, yogurt, mashed potatoes.  Things to avoid include carbonated beverages (fizzy beverages), raw fruits and raw vegetables, or beans.   If your bowels are filled with gas, your surgeon will have difficulty visualizing your pelvic organs which increases your surgical risks.  Do not eat food :After Midnight.   After Midnight you may have the following liquids until: 10:00 AM DAY OF SURGERY  Water Black Coffee (sugar ok, NO MILK/CREAM OR CREAMERS)  Tea (sugar ok, NO MILK/CREAM OR CREAMERS) regular and decaf                             Plain Jell-O (NO RED)                                           Fruit ices (not with fruit pulp, NO RED)                                     Popsicles (NO RED)                                                                  Juice: apple, WHITE grape, WHITE cranberry Sports drinks like Gatorade (NO RED)    Oral Hygiene is also important to reduce your risk of infection.                                     Remember - BRUSH YOUR TEETH THE MORNING OF SURGERY WITH YOUR REGULAR TOOTHPASTE   Do NOT smoke after Midnight   Take these medicines the morning of surgery with A SIP OF WATER: N/A                              You may not have any metal on your body including  hair pins, jewelry, and body piercing             Do not wear make-up, lotions, powders, perfumes/cologne, or deodorant  Do not wear nail polish including gel and S&S, artificial/acrylic nails, or any other type of covering on natural nails including finger and toenails. If you have artificial nails, gel coating, etc. that needs to be removed by a nail salon please have this removed prior to surgery or surgery may need to be canceled/ delayed if the surgeon/ anesthesia feels like they are unable to be safely monitored.   Do not shave  48 hours prior to surgery.    Do not bring valuables to the hospital. [place].   Contacts, dentures or bridgework may not be worn into surgery.   Bring small overnight bag day of surgery.   DO NOT [place]. PHARMACY WILL DISPENSE MEDICATIONS LISTED ON YOUR MEDICATION LIST TO YOU DURING YOUR ADMISSION [facility]!    Patients discharged on the day of surgery will not be allowed to drive home.  Someone NEEDS to stay with you for the first 24 hours after anesthesia.   Special Instructions: Bring a copy of your healthcare power of attorney and living will documents         the day of surgery if you haven't scanned them before.              Please read over the following fact sheets you were given: IF YOU HAVE QUESTIONS ABOUT YOUR PRE-OP INSTRUCTIONS PLEASE CALL [phone]    [facility] - Preparing for Surgery Before surgery, you can play an important role.  Because skin is not sterile, your skin needs to be as free of germs as possible.  You can reduce the number of germs on your skin by washing with CHG  (chlorahexidine gluconate) soap before surgery.  CHG is an antiseptic cleaner which kills germs and bonds with the skin to continue killing germs even after washing. Please DO NOT use if you have an allergy to CHG or antibacterial soaps.  If your skin becomes reddened/irritated stop using the CHG and inform your nurse when you arrive at Short Stay. Do not shave (including legs and underarms) for at least 48 hours prior to the first CHG shower.  You may shave your face/neck. Please follow these instructions carefully:  1.  Shower with CHG Soap the night before surgery and the  morning of Surgery.  2.  If you choose to wash your hair, wash your hair first as usual with your  normal  shampoo.  3.  After you shampoo, rinse your hair and body thoroughly to remove the  shampoo.                           4.  Use CHG as you would any other liquid soap.  You can apply chg directly  to the skin and wash                       Gently with a scrungie or clean washcloth.  5.  Apply the CHG Soap to your body ONLY FROM THE NECK DOWN.   Do not use on face/ open  Wound or open sores. Avoid contact with eyes, ears mouth and genitals (private parts).                       Wash face,  Genitals (private parts) with your normal soap.             6.  Wash thoroughly, paying special attention to the area where your surgery  will be performed.  7.  Thoroughly rinse your body with warm water from the neck down.  8.  DO NOT shower/wash with your normal soap after using and rinsing off  the CHG Soap.                9.  Pat yourself dry with a clean towel.            10.  Wear clean pajamas.            11.  Place clean sheets on your bed the night of your first shower and do not  sleep with pets. Day of Surgery : Do not apply any lotions/deodorants the morning of surgery.  Please wear clean clothes to the hospital/surgery center.  FAILURE TO FOLLOW THESE INSTRUCTIONS MAY RESULT IN THE CANCELLATION OF  YOUR SURGERY PATIENT SIGNATURE_________________________________  NURSE SIGNATURE__________________________________  ________________________________________________________________________ WHAT IS A BLOOD TRANSFUSION? Blood Transfusion Information  A transfusion is the replacement of blood or some of its parts. Blood is made up of multiple cells which provide different functions. Red blood cells carry oxygen and are used for blood loss replacement. White blood cells fight against infection. Platelets control bleeding. Plasma helps clot blood. Other blood products are available for specialized needs, such as hemophilia or other clotting disorders. BEFORE THE TRANSFUSION  Who gives blood for transfusions?  Healthy volunteers who are fully evaluated to make sure their blood is safe. This is blood bank blood. Transfusion therapy is the safest it has ever been in the practice of medicine. Before blood is taken from a donor, a complete history is taken to make sure that person has no history of diseases nor engages in risky social behavior (examples are intravenous drug use or sexual activity with multiple partners). The donor's travel history is screened to minimize risk of transmitting infections, such as malaria. The donated blood is tested for signs of infectious diseases, such as HIV and hepatitis. The blood is then tested to be sure it is compatible with you in order to minimize the chance of a transfusion reaction. If you or a relative donates blood, this is often done in anticipation of surgery and is not appropriate for emergency situations. It takes many days to process the donated blood. RISKS AND COMPLICATIONS Although transfusion therapy is very safe and saves many lives, the main dangers of transfusion include:  Getting an infectious disease. Developing a transfusion reaction. This is an allergic reaction to something in the blood you were given. Every precaution is taken to prevent  this. The decision to have a blood transfusion has been considered carefully by your caregiver before blood is given. Blood is not given unless the benefits outweigh the risks. AFTER THE TRANSFUSION Right after receiving a blood transfusion, you will usually feel much better and more energetic. This is especially true if your red blood cells have gotten low (anemic). The transfusion raises the level of the red blood cells which carry oxygen, and this usually causes an energy increase. The nurse administering the transfusion will monitor you carefully for complications.  HOME CARE INSTRUCTIONS  No special instructions are needed after a transfusion. You may find your energy is better. Speak with your caregiver about any limitations on activity for underlying diseases you may have. SEEK MEDICAL CARE IF:  Your condition is not improving after your transfusion. You develop redness or irritation at the intravenous (IV) site. SEEK IMMEDIATE MEDICAL CARE IF:  Any of the following symptoms occur over the next 12 hours: Shaking chills. You have a temperature by mouth above 102 F (38.9 C), not controlled by medicine. Chest, back, or muscle pain. People around you feel you are not acting correctly or are confused. Shortness of breath or difficulty breathing. Dizziness and fainting. You get a rash or develop hives. You have a decrease in urine output. Your urine turns a dark color or changes to pink, red, or brown. Any of the following symptoms occur over the next 10 days: You have a temperature by mouth above 102 F (38.9 C), not controlled by medicine. Shortness of breath. Weakness after normal activity. The white part of the eye turns yellow (jaundice). You have a decrease in the amount of urine or are urinating less often. Your urine turns a dark color or changes to pink, red, or brown. Document Released: 06/02/2000 Document Revised: 08/28/2011 Document Reviewed: 01/20/2008 [facility] Patient  Information 2014 [place], [place].  _______________________________________________________________________

## 2022-04-28 ENCOUNTER — Other Ambulatory Visit: Payer: Self-pay

## 2022-04-28 ENCOUNTER — Encounter (HOSPITAL_COMMUNITY)
Admission: RE | Admit: 2022-04-28 | Discharge: 2022-04-28 | Disposition: A | Discharge status: Home / Self Care | Payer: No Typology Code available for payment source | Source: Ambulatory Visit | Attending: Gynecologic Oncology | Admitting: Gynecologic Oncology

## 2022-04-28 ENCOUNTER — Encounter (HOSPITAL_COMMUNITY): Payer: Self-pay

## 2022-04-28 VITALS — BP 143/82 | HR 100 | Temp 98.5°F | Ht 63.0 in | Wt 134.0 lb

## 2022-04-28 DIAGNOSIS — C539 Malignant neoplasm of cervix uteri, unspecified: Secondary | ICD-10-CM | POA: Insufficient documentation

## 2022-04-28 DIAGNOSIS — Z01818 Encounter for other preprocedural examination: Secondary | ICD-10-CM

## 2022-04-28 DIAGNOSIS — Z01812 Encounter for preprocedural laboratory examination: Secondary | ICD-10-CM | POA: Insufficient documentation

## 2022-04-28 LAB — COMPREHENSIVE METABOLIC PANEL
ALT: 27 U/L (ref 0–44)
AST: 23 U/L (ref 15–41)
Albumin: 3.8 g/dL (ref 3.5–5.0)
Alkaline Phosphatase: 68 U/L (ref 38–126)
Anion gap: 8 (ref 5–15)
BUN: 18 mg/dL (ref 6–20)
CO2: 22 mmol/L (ref 22–32)
Calcium: 9.1 mg/dL (ref 8.9–10.3)
Chloride: 108 mmol/L (ref 98–111)
Creatinine, Ser: 0.6 mg/dL (ref 0.44–1.00)
GFR, Estimated: >60 mL/min (ref >60)
Glucose, Bld: 188 mg/dL — ABNORMAL HIGH (ref 70–99)
Potassium: 3.6 mmol/L (ref 3.5–5.1)
Sodium: 138 mmol/L (ref 135–145)
Total Bilirubin: 0.5 mg/dL (ref 0.3–1.2)
Total Protein: 7 g/dL (ref 6.5–8.1)

## 2022-04-28 LAB — CBC WITH DIFFERENTIAL/PLATELET
Abs Immature Granulocytes: 0.02 10*3/uL (ref 0.00–0.07)
Basophils Absolute: 0 10*3/uL (ref 0.0–0.1)
Basophils Relative: 0 %
Eosinophils Absolute: 0 10*3/uL (ref 0.0–0.5)
Eosinophils Relative: 0 %
HCT: 37 % (ref 36.0–46.0)
Hemoglobin: 12 g/dL (ref 12.0–15.0)
Immature Granulocytes: 0 %
Lymphocytes Relative: 39 %
Lymphs Abs: 2.7 10*3/uL (ref 0.7–4.0)
MCH: 25.1 pg — ABNORMAL LOW (ref 26.0–34.0)
MCHC: 32.4 g/dL (ref 30.0–36.0)
MCV: 77.2 fL — ABNORMAL LOW (ref 80.0–100.0)
Monocytes Absolute: 0.5 10*3/uL (ref 0.1–1.0)
Monocytes Relative: 7 %
Neutro Abs: 3.7 10*3/uL (ref 1.7–7.7)
Neutrophils Relative %: 54 %
Platelets: 126 10*3/uL — ABNORMAL LOW (ref 150–400)
RBC: 4.79 MIL/uL (ref 3.87–5.11)
RDW: 15.2 % (ref 11.5–15.5)
WBC: 7 10*3/uL (ref 4.0–10.5)
nRBC: 0 % (ref 0.0–0.2)

## 2022-04-28 LAB — HEMOGLOBIN A1C
Hgb A1c MFr Bld: 6.3 % — ABNORMAL HIGH (ref 4.8–5.6)
Mean Plasma Glucose: 134.11 mg/dL

## 2022-04-28 NOTE — Progress Notes (Signed)
For Short Stay: [place] appointment date:  Bowel Prep reminder:   For Anesthesia: PCP - Dr. Lona Kettle Cardiologist -   Chest x-ray -  EKG -  Stress Test -  ECHO -  Cardiac Cath -  Pacemaker/ICD device last checked: Pacemaker orders received: Device Rep notified:  Spinal Cord Stimulator:  Sleep Study -  CPAP -   Fasting Blood Sugar -  Checks Blood Sugar _____ times a day Date and result of last Hgb A1c-  Last dose of GLP1 agonist-  GLP1 instructions:   Last dose of SGLT-2 inhibitors-  SGLT-2 instructions:   Blood Thinner Instructions: Aspirin Instructions: Last Dose:  Activity level: Can go up a flight of stairs and activities of daily living without stopping and without chest pain and/or shortness of breath   Able to exercise without chest pain and/or shortness of breath   Unable to go up a flight of stairs without chest pain and/or shortness of breath     Anesthesia review:   Patient denies shortness of breath, fever, cough and chest pain at PAT appointment   Patient verbalized understanding of instructions that were given to them at the PAT appointment. Patient was also instructed that they will need to review over the PAT instructions again at home before surgery.

## 2022-04-30 LAB — CA 125: Cancer Antigen (CA) 125: 40.9 U/mL — ABNORMAL HIGH (ref 0.0–38.1)

## 2022-05-01 ENCOUNTER — Telehealth: Payer: Self-pay | Admitting: Surgery

## 2022-05-01 ENCOUNTER — Other Ambulatory Visit (HOSPITAL_COMMUNITY): Payer: Self-pay

## 2022-05-01 NOTE — Telephone Encounter (Signed)
Returned patient call regarding questions about her upcoming surgery. Patient states she is having pelvic pain that comes and goes. Rates pain 7/10. Is not taking any medication for it and denies any other symptoms. Patient advised Dr Berline Lopes would be notified but that this should not affect her upcoming surgery. Patient also asked about her Ca 125 results and was advised they are slightly elevated but also would not affect her surgery date.

## 2022-05-02 ENCOUNTER — Telehealth: Payer: Self-pay | Admitting: Surgery

## 2022-05-02 NOTE — Telephone Encounter (Signed)
Telephone call to check on pre-operative status.  Patient compliant with pre-operative instructions.  Reinforced nothing to eat after midnight. Clear liquids until 9:45am. Patient to arrive at 10:45am. Verified that post-op medications have been sent to patient's preferred pharmacy.  No questions or concerns voiced.  Instructed to call for any needs.

## 2022-05-03 ENCOUNTER — Encounter (HOSPITAL_COMMUNITY)
Admission: RE | Disposition: A | Discharge status: Home / Self Care | Payer: Self-pay | Source: Home / Self Care | Attending: Gynecologic Oncology

## 2022-05-03 ENCOUNTER — Ambulatory Visit (HOSPITAL_COMMUNITY): Payer: No Typology Code available for payment source | Admitting: Anesthesiology

## 2022-05-03 ENCOUNTER — Ambulatory Visit (HOSPITAL_BASED_OUTPATIENT_CLINIC_OR_DEPARTMENT_OTHER): Payer: No Typology Code available for payment source | Admitting: Anesthesiology

## 2022-05-03 ENCOUNTER — Encounter (HOSPITAL_COMMUNITY): Payer: Self-pay | Admitting: Gynecologic Oncology

## 2022-05-03 ENCOUNTER — Ambulatory Visit (HOSPITAL_COMMUNITY)
Admission: RE | Admit: 2022-05-03 | Discharge: 2022-05-04 | Disposition: A | Discharge status: Home / Self Care | Payer: No Typology Code available for payment source | Attending: Gynecologic Oncology | Admitting: Gynecologic Oncology

## 2022-05-03 ENCOUNTER — Other Ambulatory Visit: Payer: Self-pay

## 2022-05-03 DIAGNOSIS — N888 Other specified noninflammatory disorders of cervix uteri: Secondary | ICD-10-CM | POA: Insufficient documentation

## 2022-05-03 DIAGNOSIS — F32A Depression, unspecified: Secondary | ICD-10-CM | POA: Insufficient documentation

## 2022-05-03 DIAGNOSIS — F418 Other specified anxiety disorders: Secondary | ICD-10-CM | POA: Diagnosis not present

## 2022-05-03 DIAGNOSIS — N840 Polyp of corpus uteri: Secondary | ICD-10-CM | POA: Diagnosis not present

## 2022-05-03 DIAGNOSIS — M1990 Unspecified osteoarthritis, unspecified site: Secondary | ICD-10-CM | POA: Diagnosis not present

## 2022-05-03 DIAGNOSIS — S3141XA Laceration without foreign body of vagina and vulva, initial encounter: Secondary | ICD-10-CM | POA: Insufficient documentation

## 2022-05-03 DIAGNOSIS — G709 Myoneural disorder, unspecified: Secondary | ICD-10-CM

## 2022-05-03 DIAGNOSIS — N83201 Unspecified ovarian cyst, right side: Secondary | ICD-10-CM | POA: Diagnosis not present

## 2022-05-03 DIAGNOSIS — Z9221 Personal history of antineoplastic chemotherapy: Secondary | ICD-10-CM | POA: Insufficient documentation

## 2022-05-03 DIAGNOSIS — C579 Malignant neoplasm of female genital organ, unspecified: Secondary | ICD-10-CM | POA: Insufficient documentation

## 2022-05-03 DIAGNOSIS — R971 Elevated cancer antigen 125 [CA 125]: Secondary | ICD-10-CM

## 2022-05-03 DIAGNOSIS — D259 Leiomyoma of uterus, unspecified: Secondary | ICD-10-CM | POA: Diagnosis not present

## 2022-05-03 DIAGNOSIS — N83202 Unspecified ovarian cyst, left side: Secondary | ICD-10-CM | POA: Insufficient documentation

## 2022-05-03 DIAGNOSIS — F419 Anxiety disorder, unspecified: Secondary | ICD-10-CM | POA: Insufficient documentation

## 2022-05-03 DIAGNOSIS — X58XXXA Exposure to other specified factors, initial encounter: Secondary | ICD-10-CM | POA: Diagnosis not present

## 2022-05-03 DIAGNOSIS — C539 Malignant neoplasm of cervix uteri, unspecified: Secondary | ICD-10-CM

## 2022-05-03 HISTORY — PX: CYSTOSCOPY: SHX5120

## 2022-05-03 HISTORY — PX: ROBOTIC ASSISTED TOTAL HYSTERECTOMY WITH BILATERAL SALPINGO OOPHERECTOMY: SHX6086

## 2022-05-03 LAB — TYPE AND SCREEN
ABO/RH(D): A POS
Antibody Screen: NEGATIVE

## 2022-05-03 SURGERY — XI ROBOTIC ASSISTED TOTAL HYSTERECTOMY BILATERAL SALPINGO OOPHORECTOMY WITH OMENTECTOMY AND DEBULKING
Anesthesia: General | Site: Abdomen

## 2022-05-03 MED ORDER — ENOXAPARIN SODIUM 40 MG/0.4ML IJ SOSY: 40.0000 mg | PREFILLED_SYRINGE | INTRAMUSCULAR | Status: DC

## 2022-05-03 MED ORDER — ONDANSETRON HCL 4 MG/2ML IJ SOLN
INTRAMUSCULAR | Status: AC
  Filled 2022-05-03: qty 2

## 2022-05-03 MED ORDER — ORAL CARE MOUTH RINSE: 15.0000 mL | Freq: Once | OROMUCOSAL | Status: AC

## 2022-05-03 MED ORDER — FENTANYL CITRATE (PF) 250 MCG/5ML IJ SOLN
INTRAMUSCULAR | Status: DC | PRN
  Administered 2022-05-03 (×4): 50 ug via INTRAVENOUS
  Administered 2022-05-03: 100 ug via INTRAVENOUS

## 2022-05-03 MED ORDER — SIMETHICONE 80 MG PO CHEW: 80.0000 mg | CHEWABLE_TABLET | Freq: Four times a day (QID) | ORAL | Status: DC | PRN

## 2022-05-03 MED ORDER — PROPOFOL 500 MG/50ML IV EMUL
INTRAVENOUS | Status: DC | PRN
  Administered 2022-05-03: 150 ug/kg/min via INTRAVENOUS
  Administered 2022-05-03: 125 ug/kg/min via INTRAVENOUS

## 2022-05-03 MED ORDER — ONDANSETRON HCL 4 MG/2ML IJ SOLN
4.0000 mg | Freq: Four times a day (QID) | INTRAMUSCULAR | Status: DC | PRN
  Administered 2022-05-03: 4 mg via INTRAVENOUS
  Filled 2022-05-03: qty 2

## 2022-05-03 MED ORDER — KCL IN DEXTROSE-NACL 20-5-0.45 MEQ/L-%-% IV SOLN
INTRAVENOUS | Status: DC
  Filled 2022-05-03 (×2): qty 1000

## 2022-05-03 MED ORDER — FENTANYL CITRATE (PF) 250 MCG/5ML IJ SOLN
INTRAMUSCULAR | Status: AC
  Filled 2022-05-03: qty 5

## 2022-05-03 MED ORDER — ACETAMINOPHEN 10 MG/ML IV SOLN: 1000.0000 mg | Freq: Once | INTRAVENOUS | Status: DC | PRN

## 2022-05-03 MED ORDER — PROPOFOL 10 MG/ML IV BOLUS
INTRAVENOUS | Status: DC | PRN
  Administered 2022-05-03: 50 mg via INTRAVENOUS
  Administered 2022-05-03: 40 mg via INTRAVENOUS
  Administered 2022-05-03: 150 mg via INTRAVENOUS

## 2022-05-03 MED ORDER — FENTANYL CITRATE PF 50 MCG/ML IJ SOSY: 25.0000 ug | PREFILLED_SYRINGE | INTRAMUSCULAR | Status: DC | PRN

## 2022-05-03 MED ORDER — METOPROLOL TARTRATE 5 MG/5ML IV SOLN
2.5000 mg | Freq: Once | INTRAVENOUS | Status: AC
  Administered 2022-05-03: 2.5 mg via INTRAVENOUS

## 2022-05-03 MED ORDER — PHENYLEPHRINE 80 MCG/ML (10ML) SYRINGE FOR IV PUSH (FOR BLOOD PRESSURE SUPPORT)
PREFILLED_SYRINGE | INTRAVENOUS | Status: AC
  Filled 2022-05-03: qty 10

## 2022-05-03 MED ORDER — LACTATED RINGERS IV SOLN: INTRAVENOUS | Status: DC | PRN

## 2022-05-03 MED ORDER — LIDOCAINE 2% (20 MG/ML) 5 ML SYRINGE
INTRAMUSCULAR | Status: DC | PRN
  Administered 2022-05-03: 60 mg via INTRAVENOUS

## 2022-05-03 MED ORDER — SUGAMMADEX SODIUM 200 MG/2ML IV SOLN
INTRAVENOUS | Status: DC | PRN
  Administered 2022-05-03: 150 mg via INTRAVENOUS
  Administered 2022-05-03: 50 mg via INTRAVENOUS

## 2022-05-03 MED ORDER — KETAMINE HCL-SODIUM CHLORIDE 100-0.9 MG/10ML-% IV SOSY
PREFILLED_SYRINGE | INTRAVENOUS | Status: DC | PRN
  Administered 2022-05-03 (×2): 10 mg via INTRAVENOUS

## 2022-05-03 MED ORDER — ACETAMINOPHEN 160 MG/5ML PO SOLN: 1000.0000 mg | Freq: Once | ORAL | Status: DC | PRN

## 2022-05-03 MED ORDER — DEXAMETHASONE SODIUM PHOSPHATE 4 MG/ML IJ SOLN: 4.0000 mg | INTRAMUSCULAR | Status: DC

## 2022-05-03 MED ORDER — MIDAZOLAM HCL 2 MG/2ML IJ SOLN
INTRAMUSCULAR | Status: AC
  Filled 2022-05-03: qty 2

## 2022-05-03 MED ORDER — ROCURONIUM BROMIDE 10 MG/ML (PF) SYRINGE
PREFILLED_SYRINGE | INTRAVENOUS | Status: AC
  Filled 2022-05-03: qty 10

## 2022-05-03 MED ORDER — BUPIVACAINE LIPOSOME 1.3 % IJ SUSP
INTRAMUSCULAR | Status: AC
  Filled 2022-05-03: qty 20

## 2022-05-03 MED ORDER — MIDAZOLAM HCL 5 MG/5ML IJ SOLN
INTRAMUSCULAR | Status: DC | PRN
  Administered 2022-05-03: 2 mg via INTRAVENOUS

## 2022-05-03 MED ORDER — KETAMINE HCL 50 MG/5ML IJ SOSY
PREFILLED_SYRINGE | INTRAMUSCULAR | Status: AC
  Filled 2022-05-03: qty 5

## 2022-05-03 MED ORDER — OXYCODONE HCL 5 MG PO TABS: 5.0000 mg | ORAL_TABLET | Freq: Once | ORAL | Status: DC | PRN

## 2022-05-03 MED ORDER — CHLORHEXIDINE GLUCONATE 0.12 % MT SOLN
15.0000 mL | Freq: Once | OROMUCOSAL | Status: AC
  Administered 2022-05-03: 15 mL via OROMUCOSAL

## 2022-05-03 MED ORDER — FENTANYL CITRATE (PF) 100 MCG/2ML IJ SOLN
INTRAMUSCULAR | Status: AC
  Filled 2022-05-03: qty 2

## 2022-05-03 MED ORDER — LACTATED RINGERS IV SOLN: INTRAVENOUS | Status: DC

## 2022-05-03 MED ORDER — OXYCODONE HCL 5 MG/5ML PO SOLN: 5.0000 mg | Freq: Once | ORAL | Status: DC | PRN

## 2022-05-03 MED ORDER — LACTATED RINGERS IR SOLN
Status: DC | PRN
  Administered 2022-05-03: 1000 mL

## 2022-05-03 MED ORDER — OXYCODONE HCL 5 MG PO TABS
5.0000 mg | ORAL_TABLET | ORAL | Status: DC | PRN
  Administered 2022-05-04 (×2): 10 mg via ORAL
  Filled 2022-05-03 (×2): qty 2

## 2022-05-03 MED ORDER — PROPOFOL 1000 MG/100ML IV EMUL
INTRAVENOUS | Status: AC
  Filled 2022-05-03: qty 100

## 2022-05-03 MED ORDER — ROCURONIUM BROMIDE 10 MG/ML (PF) SYRINGE
PREFILLED_SYRINGE | INTRAVENOUS | Status: DC | PRN
  Administered 2022-05-03: 10 mg via INTRAVENOUS
  Administered 2022-05-03: 20 mg via INTRAVENOUS
  Administered 2022-05-03: 10 mg via INTRAVENOUS
  Administered 2022-05-03: 70 mg via INTRAVENOUS

## 2022-05-03 MED ORDER — ONDANSETRON HCL 4 MG PO TABS: 4.0000 mg | ORAL_TABLET | Freq: Four times a day (QID) | ORAL | Status: DC | PRN

## 2022-05-03 MED ORDER — DEXAMETHASONE SODIUM PHOSPHATE 10 MG/ML IJ SOLN
INTRAMUSCULAR | Status: AC
  Filled 2022-05-03: qty 1

## 2022-05-03 MED ORDER — LIDOCAINE HCL (PF) 2 % IJ SOLN
INTRAMUSCULAR | Status: AC
  Filled 2022-05-03: qty 5

## 2022-05-03 MED ORDER — KETOROLAC TROMETHAMINE 15 MG/ML IJ SOLN: 15.0000 mg | INTRAMUSCULAR | Status: DC

## 2022-05-03 MED ORDER — SURGIFLO WITH THROMBIN (HEMOSTATIC MATRIX KIT) OPTIME
TOPICAL | Status: DC | PRN
  Administered 2022-05-03: 1 via TOPICAL

## 2022-05-03 MED ORDER — TRAMADOL HCL 50 MG PO TABS
50.0000 mg | ORAL_TABLET | Freq: Four times a day (QID) | ORAL | Status: DC | PRN
  Administered 2022-05-04: 50 mg via ORAL
  Filled 2022-05-03: qty 1

## 2022-05-03 MED ORDER — METOPROLOL TARTRATE 5 MG/5ML IV SOLN
INTRAVENOUS | Status: DC | PRN
  Administered 2022-05-03 (×3): 1 mg via INTRAVENOUS

## 2022-05-03 MED ORDER — ALUM & MAG HYDROXIDE-SIMETH 200-200-20 MG/5ML PO SUSP: 30.0000 mL | ORAL | Status: DC | PRN

## 2022-05-03 MED ORDER — PHENYLEPHRINE 80 MCG/ML (10ML) SYRINGE FOR IV PUSH (FOR BLOOD PRESSURE SUPPORT)
PREFILLED_SYRINGE | INTRAVENOUS | Status: DC | PRN
  Administered 2022-05-03: 80 ug via INTRAVENOUS

## 2022-05-03 MED ORDER — ACETAMINOPHEN 500 MG PO TABS
1000.0000 mg | ORAL_TABLET | ORAL | Status: AC
  Administered 2022-05-03: 1000 mg via ORAL
  Filled 2022-05-03: qty 2

## 2022-05-03 MED ORDER — BUPIVACAINE HCL 0.25 % IJ SOLN
INTRAMUSCULAR | Status: AC
  Filled 2022-05-03: qty 1

## 2022-05-03 MED ORDER — SENNOSIDES-DOCUSATE SODIUM 8.6-50 MG PO TABS
2.0000 | ORAL_TABLET | Freq: Every day | ORAL | Status: DC
  Administered 2022-05-03: 2 via ORAL
  Filled 2022-05-03: qty 2

## 2022-05-03 MED ORDER — PROPOFOL 10 MG/ML IV BOLUS
INTRAVENOUS | Status: AC
  Filled 2022-05-03: qty 20

## 2022-05-03 MED ORDER — SCOPOLAMINE 1 MG/3DAYS TD PT72
1.0000 | MEDICATED_PATCH | TRANSDERMAL | Status: DC
  Administered 2022-05-03: 1.5 mg via TRANSDERMAL
  Filled 2022-05-03: qty 1

## 2022-05-03 MED ORDER — ACETAMINOPHEN 500 MG PO TABS: 1000.0000 mg | ORAL_TABLET | Freq: Once | ORAL | Status: DC | PRN

## 2022-05-03 MED ORDER — IBUPROFEN 400 MG PO TABS
800.0000 mg | ORAL_TABLET | Freq: Three times a day (TID) | ORAL | Status: DC | PRN
  Administered 2022-05-04: 800 mg via ORAL
  Filled 2022-05-03: qty 2

## 2022-05-03 MED ORDER — STERILE WATER FOR IRRIGATION IR SOLN
Status: DC | PRN
  Administered 2022-05-03: 1000 mL

## 2022-05-03 MED ORDER — BUPIVACAINE LIPOSOME 1.3 % IJ SUSP
INTRAMUSCULAR | Status: DC | PRN
  Administered 2022-05-03: 20 mL

## 2022-05-03 MED ORDER — DEXAMETHASONE SODIUM PHOSPHATE 10 MG/ML IJ SOLN
INTRAMUSCULAR | Status: DC | PRN
  Administered 2022-05-03: 10 mg via INTRAVENOUS

## 2022-05-03 MED ORDER — BUPIVACAINE HCL 0.25 % IJ SOLN
INTRAMUSCULAR | Status: DC | PRN
  Administered 2022-05-03: 30 mL
  Administered 2022-05-03: 20 mL

## 2022-05-03 MED ORDER — HEPARIN SODIUM (PORCINE) 5000 UNIT/ML IJ SOLN
5000.0000 [IU] | INTRAMUSCULAR | Status: AC
  Administered 2022-05-03: 5000 [IU] via SUBCUTANEOUS
  Filled 2022-05-03: qty 1

## 2022-05-03 MED ORDER — CEFAZOLIN SODIUM-DEXTROSE 2-4 GM/100ML-% IV SOLN
2.0000 g | INTRAVENOUS | Status: AC
  Administered 2022-05-03: 2 g via INTRAVENOUS
  Filled 2022-05-03: qty 100

## 2022-05-03 MED ORDER — ACETAMINOPHEN 500 MG PO TABS
1000.0000 mg | ORAL_TABLET | Freq: Four times a day (QID) | ORAL | Status: DC
  Administered 2022-05-03 – 2022-05-04 (×2): 1000 mg via ORAL
  Filled 2022-05-03 (×2): qty 2

## 2022-05-03 MED ORDER — ONDANSETRON HCL 4 MG/2ML IJ SOLN
INTRAMUSCULAR | Status: DC | PRN
  Administered 2022-05-03: 4 mg via INTRAVENOUS

## 2022-05-03 MED ORDER — METOPROLOL TARTRATE 5 MG/5ML IV SOLN
INTRAVENOUS | Status: AC
  Filled 2022-05-03: qty 5

## 2022-05-03 MED ORDER — HYDROMORPHONE HCL 1 MG/ML IJ SOLN
0.2000 mg | INTRAMUSCULAR | Status: DC | PRN
  Administered 2022-05-03: 0.5 mg via INTRAVENOUS
  Administered 2022-05-04: 0.2 mg via INTRAVENOUS
  Filled 2022-05-03 (×2): qty 1

## 2022-05-03 SURGICAL SUPPLY — 76 items
ADH SKN CLS APL DERMABOND .7 (GAUZE/BANDAGES/DRESSINGS) ×2
AGENT HMST KT MTR STRL THRMB (HEMOSTASIS) ×2
APL ESCP 34 STRL LF DISP (HEMOSTASIS) ×2
APPLICATOR SURGIFLO ENDO (HEMOSTASIS) IMPLANT
BAG LAPAROSCOPIC 12 15 PORT 16 (BASKET) IMPLANT
BAG RETRIEVAL 12/15 (BASKET) ×2
BLADE SURG SZ10 CARB STEEL (BLADE) IMPLANT
COVER BACK TABLE 60X90IN (DRAPES) ×2 IMPLANT
COVER TIP SHEARS 8 DVNC (MISCELLANEOUS) ×2 IMPLANT
COVER TIP SHEARS 8MM DA VINCI (MISCELLANEOUS) ×2
DERMABOND ADVANCED .7 DNX12 (GAUZE/BANDAGES/DRESSINGS) ×2 IMPLANT
DRAPE ARM DVNC X/XI (DISPOSABLE) ×8 IMPLANT
DRAPE COLUMN DVNC XI (DISPOSABLE) ×2 IMPLANT
DRAPE DA VINCI XI ARM (DISPOSABLE) ×8
DRAPE DA VINCI XI COLUMN (DISPOSABLE) ×2
DRAPE SHEET LG 3/4 BI-LAMINATE (DRAPES) ×2 IMPLANT
DRAPE SURG IRRIG POUCH 19X23 (DRAPES) ×2 IMPLANT
DRSG OPSITE POSTOP 4X6 (GAUZE/BANDAGES/DRESSINGS) IMPLANT
DRSG OPSITE POSTOP 4X8 (GAUZE/BANDAGES/DRESSINGS) IMPLANT
ELECT PENCIL ROCKER SW 15FT (MISCELLANEOUS) IMPLANT
ELECT REM PT RETURN 15FT ADLT (MISCELLANEOUS) ×2 IMPLANT
GAUZE 4X4 16PLY ~~LOC~~+RFID DBL (SPONGE) ×4 IMPLANT
GLOVE BIO SURGEON STRL SZ 6 (GLOVE) ×8 IMPLANT
GLOVE BIO SURGEON STRL SZ 6.5 (GLOVE) ×2 IMPLANT
GOWN STRL REUS W/ TWL LRG LVL3 (GOWN DISPOSABLE) ×8 IMPLANT
GOWN STRL REUS W/TWL LRG LVL3 (GOWN DISPOSABLE) ×8
GRASPER SUT TROCAR 14GX15 (MISCELLANEOUS) IMPLANT
HOLDER FOLEY CATH W/STRAP (MISCELLANEOUS) IMPLANT
IRRIG SUCT STRYKERFLOW 2 WTIP (MISCELLANEOUS) ×2
IRRIGATION SUCT STRKRFLW 2 WTP (MISCELLANEOUS) ×2 IMPLANT
KIT PROCEDURE DA VINCI SI (MISCELLANEOUS)
KIT PROCEDURE DVNC SI (MISCELLANEOUS) IMPLANT
KIT TURNOVER KIT A (KITS) IMPLANT
LIGASURE IMPACT 36 18CM CVD LR (INSTRUMENTS) IMPLANT
MANIPULATOR ADVINCU DEL 3.0 PL (MISCELLANEOUS) IMPLANT
MANIPULATOR ADVINCU DEL 3.5 PL (MISCELLANEOUS) IMPLANT
MANIPULATOR UTERINE 4.5 ZUMI (MISCELLANEOUS) IMPLANT
NDL HYPO 21X1.5 SAFETY (NEEDLE) ×2 IMPLANT
NDL SPNL 18GX3.5 QUINCKE PK (NEEDLE) IMPLANT
NEEDLE HYPO 21X1.5 SAFETY (NEEDLE) ×2 IMPLANT
NEEDLE SPNL 18GX3.5 QUINCKE PK (NEEDLE) IMPLANT
OBTURATOR OPTICAL STANDARD 8MM (TROCAR) ×2
OBTURATOR OPTICAL STND 8 DVNC (TROCAR) ×2
OBTURATOR OPTICALSTD 8 DVNC (TROCAR) ×2 IMPLANT
PACK ROBOT GYN CUSTOM WL (TRAY / TRAY PROCEDURE) ×2 IMPLANT
PAD POSITIONING PINK XL (MISCELLANEOUS) ×2 IMPLANT
PORT ACCESS TROCAR AIRSEAL 12 (TROCAR) ×2 IMPLANT
PORT ACCESS TROCAR AIRSEAL 5 (TROCAR) IMPLANT
SEAL CANN UNIV 5-8 DVNC XI (MISCELLANEOUS) ×8 IMPLANT
SEAL XI 5MM-8MM UNIVERSAL (MISCELLANEOUS) ×8
SET TRI-LUMEN FLTR TB AIRSEAL (TUBING) ×2 IMPLANT
SOL PREP POV-IOD 4OZ 10% (MISCELLANEOUS) ×4 IMPLANT
SPIKE FLUID TRANSFER (MISCELLANEOUS) ×2 IMPLANT
SPONGE T-LAP 18X18 ~~LOC~~+RFID (SPONGE) IMPLANT
SURGIFLO W/THROMBIN 8M KIT (HEMOSTASIS) IMPLANT
SUT MNCRL AB 4-0 PS2 18 (SUTURE) IMPLANT
SUT PDS AB 1 TP1 96 (SUTURE) IMPLANT
SUT VIC AB 0 CT1 27 (SUTURE)
SUT VIC AB 0 CT1 27XBRD ANTBC (SUTURE) IMPLANT
SUT VIC AB 2-0 CT1 27 (SUTURE) ×2
SUT VIC AB 2-0 CT1 TAPERPNT 27 (SUTURE) IMPLANT
SUT VIC AB 4-0 PS2 18 (SUTURE) ×4 IMPLANT
SUT VICRYL 0 UR6 27IN ABS (SUTURE) IMPLANT
SUT VLOC 180 0 9IN  GS21 (SUTURE) ×2
SUT VLOC 180 0 9IN GS21 (SUTURE) IMPLANT
SYR 10ML LL (SYRINGE) IMPLANT
SYS BAG RETRIEVAL 10MM (BASKET) ×2
SYS WOUND ALEXIS 18CM MED (MISCELLANEOUS)
SYSTEM BAG RETRIEVAL 10MM (BASKET) IMPLANT
SYSTEM WOUND ALEXIS 18CM MED (MISCELLANEOUS) IMPLANT
TOWEL OR NON WOVEN STRL DISP B (DISPOSABLE) IMPLANT
TRAP SPECIMEN MUCUS 40CC (MISCELLANEOUS) IMPLANT
TRAY FOLEY MTR SLVR 16FR STAT (SET/KITS/TRAYS/PACK) ×2 IMPLANT
UNDERPAD 30X36 HEAVY ABSORB (UNDERPADS AND DIAPERS) ×4 IMPLANT
WATER STERILE IRR 1000ML POUR (IV SOLUTION) ×2 IMPLANT
YANKAUER SUCT BULB TIP 10FT TU (MISCELLANEOUS) IMPLANT

## 2022-05-03 NOTE — Transfer of Care (Signed)
Immediate Anesthesia Transfer of Care Note  Patient: Tiffany [name]  Procedure(s) Performed: Procedure(s): XI ROBOTIC ASSISTED hysterectomy BILATERAL SALPINGO OOPHORECTOMY WITH OMENTECTOMY AND DEBULKING, LAPAROTOMY (N/A) CYSTOSCOPY (N/A)  Patient Location: PACU  Anesthesia Type:General  Level of Consciousness: Alert, Awake, Oriented  Airway & Oxygen Therapy: Patient Spontanous Breathing  Post-op Assessment: Report given to RN  Post vital signs: Reviewed and stable  Last Vitals:  Vitals:   05/03/22 1056  BP: (!) 154/85  Pulse: 97  Resp: 16  Temp: 36.9 C  SpO2: 242%    Complications: No apparent anesthesia complications

## 2022-05-03 NOTE — Anesthesia Procedure Notes (Signed)
Procedure Name: Intubation Date/Time: 05/03/2022 1:40 PM  Performed by: Jenne Campus, CRNAPre-anesthesia Checklist: Patient identified, Emergency Drugs available, Suction available and Patient being monitored Patient Re-evaluated:Patient Re-evaluated prior to induction Oxygen Delivery Method: Circle System Utilized Preoxygenation: Pre-oxygenation with 100% oxygen Induction Type: IV induction Ventilation: Mask ventilation without difficulty Laryngoscope Size: Glidescope and 3 Grade View: Grade I Tube type: Oral Tube size: 6.5 mm Number of attempts: 1 Airway Equipment and Method: Stylet and Oral airway Placement Confirmation: ETT inserted through vocal cords under direct vision, positive ETCO2 and breath sounds checked- equal and bilateral Secured at: 20 cm Tube secured with: Tape Dental Injury: Teeth and Oropharynx as per pre-operative assessment

## 2022-05-03 NOTE — Interval H&P Note (Signed)
History and Physical Interval Note:  05/03/2022 12:17 PM  Tiffany [name]  has presented today for surgery, with the diagnosis of ADVANCE GYN MALIGNANCY.  The various methods of treatment have been discussed with the patient and family. After consideration of risks, benefits and other options for treatment, the patient has consented to  Procedure(s): XI ROBOTIC ASSISTED MODIFIED RADICAL HYSTERECTOMY BILATERAL SALPINGO OOPHORECTOMY WITH OMENTECTOMY AND DEBULKING (N/A) POSSIBLE LAPAROTOMY (N/A) as a surgical intervention.  The patient's history has been reviewed, patient examined, no change in status, stable for surgery.  I have reviewed the patient's chart and labs.  Questions were answered to the patient's satisfaction.     [name]

## 2022-05-03 NOTE — Brief Op Note (Signed)
05/03/2022  5:17 PM  PATIENT:  Tiffany [name]  61 y.o. female  PRE-OPERATIVE DIAGNOSIS:  ADVANCE GYN MALIGNANCY  POST-OPERATIVE DIAGNOSIS:  ADVANCE GYN MALIGNANCY  PROCEDURE:  Procedure(s): XI ROBOTIC ASSISTED hysterectomy BILATERAL SALPINGO OOPHORECTOMY WITH OMENTECTOMY AND DEBULKING, LAPAROTOMY (N/A) CYSTOSCOPY (N/A)  SURGEON:  Surgeon(s) and Role:    [name], MD - Primary  ASSISTANTS: Cross, Melissa   ANESTHESIA:   general  EBL:  200 mL   BLOOD ADMINISTERED:none  DRAINS: none   LOCAL MEDICATIONS USED:  0.25% marcaine, exparel  SPECIMEN:  uterus, cervix, bilateral tubes and ovaries, omentum  DISPOSITION OF SPECIMEN:  pathology  COUNTS:  YES  TOURNIQUET:  * No tourniquets in log *  DICTATION: .Note written in EPIC  PLAN OF CARE: Admit for overnight observation  PATIENT DISPOSITION:  PACU - hemodynamically stable.   Delay start of Pharmacological VTE agent (>24hrs) due to surgical blood loss or risk of bleeding: no

## 2022-05-03 NOTE — Discharge Instructions (Signed)
AFTER SURGERY INSTRUCTIONS   Return to work: 4-6 weeks if applicable   We will plan to send you home with 2 weeks of a blood thinner tablet to help prevent blood clots after surgery. THIS WILL BE FOR AFTER SURGERY ONLY AND NEEDS TO BE TAKEN AT THE SAME TIME EACH DAY.   Activity: 1. Be up and out of the bed during the day.  Take a nap if needed.  You may walk up steps but be careful and use the hand rail.  Stair climbing will tire you more than you think, you may need to stop part way and rest.    2. No lifting or straining for 6 weeks over 10 pounds. No pushing, pulling, straining for 6 weeks.   3. No driving for around 1 week(s).  Do not drive if you are taking narcotic pain medicine and make sure that your reaction time has returned.    4. You can shower as soon as the next day after surgery. Shower daily.  Use your regular soap and water (not directly on the incision) and pat your incision(s) dry afterwards; don't rub.  No tub baths or submerging your body in water until cleared by your surgeon. If you have the soap that was given to you by pre-surgical testing that was used before surgery, you do not need to use it afterwards because this can irritate your incisions.    5. No sexual activity and nothing in the vagina for 8-10 weeks.   6. You may experience a small amount of clear drainage from your incisions, which is normal.  If the drainage persists, increases, or changes color please call the office.   7. Do not use creams, lotions, or ointments such as neosporin on your incisions after surgery until advised by your surgeon because they can cause removal of the dermabond glue on your incisions.     8. You may experience vaginal spotting after surgery or around the 6-8 week mark from surgery when the stitches at the top of the vagina begin to dissolve.  The spotting is normal but if you experience heavy bleeding, call our office.   9. Take Tylenol or ibuprofen first for pain if you are  able to take these medications and only use narcotic pain medication for severe pain not relieved by the Tylenol or Ibuprofen.  Monitor your Tylenol intake to a max of 4,000 mg in a 24 hour period. You can alternate these medications after surgery.   Diet: 1. Low sodium Heart Healthy Diet is recommended but you are cleared to resume your normal (before surgery) diet after your procedure.   2. It is safe to use a laxative, such as Miralax or Colace, if you have difficulty moving your bowels. You have been prescribed Sennakot-S to take at bedtime every evening after surgery to keep bowel movements regular and to prevent constipation.     Wound Care: 1. Keep clean and dry.  Shower daily.   Reasons to call the Doctor: Fever - Oral temperature greater than 100.4 degrees Fahrenheit Foul-smelling vaginal discharge Difficulty urinating Nausea and vomiting Increased pain at the site of the incision that is unrelieved with pain medicine. Difficulty breathing with or without chest pain New calf pain especially if only on one side Sudden, continuing increased vaginal bleeding with or without clots.   Contacts: For questions or concerns you should contact:   Dr. Jeral Pinch at [phone]   Joylene John, NP at [phone]   After Hours:  call [phone] and have the GYN Oncologist paged/contacted (after 5 pm or on the weekends).   Messages sent via mychart are for non-urgent matters and are not responded to after hours so for urgent needs, please call the after hours number.

## 2022-05-03 NOTE — Op Note (Signed)
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  No visible cervical lesion.  On EUA, moderately mobile, enlarged uterus with mobile fullness appreciated in the cul-de-sac.  On intra-abdominal entry, normal upper abdominal survey.  Some adhesions of the omentum to the anterior abdominal wall below the umbilicus were noted.  Normal-appearing omentum, small and large bowel.  No ascites.  Right ovary minimally enlarged but overall normal in appearance.  Left ovary replaced by a 6 cm cystic mass, smooth.  Normal-appearing bilateral fallopian tubes.  Bladder was somewhat adherent anteriorly to the cervix.  Uterus approximately 8 cm and deviated to the left given a 10 cm anterior and right-sided fibroid.  The cervix itself did not appear enlarged, either on intra-abdominal phonation or speculum exam.  Posteriorly, a white ridge of somewhat indurated tissue was noted underneath the cervix and just above the rectum.  On palpation, this ridge could be appreciated only on rectovaginal exam.  It was separate and free from the cervix itself.  It was difficult to distinguish whether this was treated tumor or active tumor although its detached location from the cervix and uterus was noted.  Given my conversation with the patient prior to surgery and that she would not want an ostomy (even temporarily), there was no way to resect this area out without significant risk of damage to the rectum.  Given its  location, even in the setting of a bowel prep (which she had not had), she would likely require at least a protective diverting ostomy. Cystoscopy, bladder dome noted to be intact and good efflux seen from bilateral ureteral orifices.  Estimated Blood Loss:  200 cc      Total IV Fluids: see I&O flowsheet         Specimens: uterus, cervix, bilateral tubes and ovaries, omentum         Complications:  None apparent; patient tolerated the procedure well.         Disposition: PACU - hemodynamically stable.  Procedure Details  The patient was seen in the Holding Room. The risks, benefits, complications, treatment options, and expected outcomes were discussed with the patient.  The patient concurred with the proposed plan, giving informed consent.  The site of surgery properly noted/marked. The patient was identified as Tiffany [name] and the procedure verified as a Robotic-assisted hysterectomy with bilateral salpingo oophorectomy, tumor debulking.   After induction of anesthesia, the patient was draped and prepped in the usual sterile manner. Patient was placed in supine position after anesthesia and draped and prepped in the usual sterile manner as follows: Her arms were tucked to her side with all appropriate precautions.  The patient was secured to the bed using padding and tape across her chest.  The patient was placed in the semi-lithotomy position in [place].  The perineum and vagina were prepped with Betadine. The patient's abdomen was prepped with ChloraPrep and then she was draped after the prep had been allowed to dry for 3 minutes.  A Time Out was held and the above information confirmed.  The urethra was prepped with Betadine. Foley catheter was placed.  A sterile speculum was placed in the vagina.  OG tube placement was confirmed and to suction.   Next, a 10 mm skin incision was made 1 cm below the subcostal margin in the midclavicular line.  The 5 mm Optiview port and scope was used  for direct entry.  Opening pressure was under 10 mm CO2.  The abdomen was insufflated and the findings were noted as above.   At this point and all points during the procedure, the patient's intra-abdominal pressure did not exceed 15 mmHg. Next, an 8 mm skin incision was made superior to the umbilicus and a right and left port were placed about 8 cm lateral to the robot port on the right and left side.  A fourth arm was placed on the right.  The 5 mm assist trocar was exchanged for a 10-12 mm port. All ports were placed under direct visualization.  The patient was placed in steep Trendelenburg.  The robot was docked in the normal manner.  Patient was first turned to the omental adhesions to the anterior abdominal wall.  These were taken down with sharp dissection and monopolar electrocautery.  Pelvis was then inspected with findings noted above and the decision made to proceed with surgery.  The right and left peritoneum were opened parallel to the IP ligament to open the retroperitoneal spaces bilaterally. The round ligaments were transected. The ureter was noted to be on the medial leaf of the broad ligament.  The peritoneum above the ureter was incised and stretched and the infundibulopelvic ligament was skeletonized, cauterized and cut.  Given enlarged left ovary, the utero-ovarian and fallopian tube on the left were cauterized and transected, freeing the left ovary which was placed in an Endo Catch bag.  Given the large right-sided uterine fibroid, the same procedure was performed for the right adnexa.  Bilateral uterine arteries were identified at their origin.  The uterine artery on the left was cauterized at its origin.  This was done after the ureter was again noted along the medial leaf of the broad ligament and mobilized medially.  The EEA sizer was placed within the vagina.  The posterior peritoneum was taken down to the level of the EEA sizer.  The anterior peritoneum was also taken down.  The  bladder flap was created to the level of the KOH ring.  This was somewhat challenging given treatment effect from chemotherapy and bladder adherent to the cervix and lower uterine segment.  The uterine artery on the right side was skeletonized, cauterized and cut in the normal manner.  A similar procedure was performed on the left.  The colpotomy was made and the uterus, cervix were amputated and placed in an Endo Catch bag.  Pedicles were inspected and excellent hemostasis was achieved.    The colpotomy at the vaginal cuff was closed with 0 Vicryl using a figure-of-eight at each apex and 0 V-Loc to close the midline in a running manner.  Irrigation was used and excellent hemostasis was achieved.    Given anterior bladder dissection, cystoscopy was performed with findings noted above.  Foley catheter was replaced after cystoscopy.  Patient was turned again to the pelvis which was irrigated.  Surgiflo was placed to assure excellent continued hemostasis.  Robotic instruments were removed under direct visulaization.  The robot was undocked.   The supraumbilical trocar was removed and the incision extended 8-10 cm with a scalpel. The incision was carried down to and through the fascia, with the abdomen insufflated, using  monopolar electrocautery. The peritoneal incision was extended under direct visualization. The endocatch bag with the uterine specimen was delivered through the incision as was the bag containing bilateral adnexa.  An infracolic omentectomy was then performed using monopolar electrocautery and the LigaSure device.  Omentum was handed off the field.  The mini-lap incision was then closed with running #1 looped PDS tied in the midline. The subcutaneous tissue was irrigated and hemostasis achieved. Exparel was injected for local anesthesia. The subcutaneous tissue was closed with 2-0 Vicyrl in running fashion.   The fascia at the 10-12 mm port was closed with 0 Vicryl on a UR-5 needle.  The  subcuticular tissue of all incisions was closed with 4-0 Vicryl and the skin was closed with 4-0 Monocryl in a subcuticular manner.  Dermabond was applied.    The vagina was swabbed with minimal bleeding noted.  Speculum exam was performed and a very small defect in the vaginal mucosa was noted along the posterior aspect of the incision.  A figure of 8 using 0 Vicryl was used to oversew this area.  Excellent hemostasis was noted.  Some bleeding was noted from a posterior distal vaginal laceration.  2-0 Vicryl was used in a running fashion to achieve hemostasis.  All sponge, lap and needle counts were correct x  3.   The patient was transferred to the recovery room in stable condition.  Jeral Pinch, MD

## 2022-05-03 NOTE — Anesthesia Preprocedure Evaluation (Addendum)
Anesthesia Evaluation  Patient identified by MRN, date of birth, ID band Patient awake    Reviewed: Allergy & Precautions, NPO status , Patient's Chart, lab work & pertinent test results  History of Anesthesia Complications Negative for: history of anesthetic complications  Airway Mallampati: II  TM Distance: >3 FB Neck ROM: Full    Dental  (+) Teeth Intact, Dental Advisory Given   Pulmonary neg pulmonary ROS, neg shortness of breath, neg COPD, neg recent URI   breath sounds clear to auscultation       Cardiovascular negative cardio ROS  Rhythm:Regular     Neuro/Psych  PSYCHIATRIC DISORDERS Anxiety Depression     Neuromuscular disease    GI/Hepatic Neg liver ROS,GERD  ,,  Endo/Other  negative endocrine ROS    Renal/GU negative Renal ROS     Musculoskeletal  (+) Arthritis ,    Abdominal   Peds  Hematology Lab Results      Component                Value               Date                      WBC                      7.0                 04/28/2022                HGB                      12.0                04/28/2022                HCT                      37.0                04/28/2022                MCV                      77.2 (L)            04/28/2022                PLT                      126 (L)             04/28/2022              Anesthesia Other Findings   Reproductive/Obstetrics                              Anesthesia Physical Anesthesia Plan  ASA: 3  Anesthesia Plan: General   Post-op Pain Management: Tylenol PO (pre-op)* and Toradol IV (intra-op)*   Induction: Intravenous  PONV Risk Score and Plan: 3 and Ondansetron, Dexamethasone, Propofol infusion, TIVA and Midazolam  Airway Management Planned: Oral ETT  Additional Equipment: None  Intra-op Plan:   Post-operative Plan: Extubation in OR  Informed Consent: I have reviewed the patients History and Physical,  chart, labs and discussed the procedure including the risks, benefits and  alternatives for the proposed anesthesia with the patient or authorized representative who has indicated his/her understanding and acceptance.     Dental advisory given  Plan Discussed with: CRNA  Anesthesia Plan Comments:         Anesthesia Quick Evaluation

## 2022-05-04 ENCOUNTER — Encounter (HOSPITAL_COMMUNITY): Payer: Self-pay | Admitting: Gynecologic Oncology

## 2022-05-04 DIAGNOSIS — C579 Malignant neoplasm of female genital organ, unspecified: Secondary | ICD-10-CM | POA: Diagnosis not present

## 2022-05-04 LAB — BASIC METABOLIC PANEL
Anion gap: 8 (ref 5–15)
BUN: 13 mg/dL (ref 6–20)
CO2: 22 mmol/L (ref 22–32)
Calcium: 8.9 mg/dL (ref 8.9–10.3)
Chloride: 106 mmol/L (ref 98–111)
Creatinine, Ser: 0.68 mg/dL (ref 0.44–1.00)
GFR, Estimated: >60 mL/min (ref >60)
Glucose, Bld: 182 mg/dL — ABNORMAL HIGH (ref 70–99)
Potassium: 4 mmol/L (ref 3.5–5.1)
Sodium: 136 mmol/L (ref 135–145)

## 2022-05-04 LAB — CBC
HCT: 35.2 % — ABNORMAL LOW (ref 36.0–46.0)
Hemoglobin: 11.8 g/dL — ABNORMAL LOW (ref 12.0–15.0)
MCH: 25.3 pg — ABNORMAL LOW (ref 26.0–34.0)
MCHC: 33.5 g/dL (ref 30.0–36.0)
MCV: 75.5 fL — ABNORMAL LOW (ref 80.0–100.0)
Platelets: 141 10*3/uL — ABNORMAL LOW (ref 150–400)
RBC: 4.66 MIL/uL (ref 3.87–5.11)
RDW: 15 % (ref 11.5–15.5)
WBC: 10.1 10*3/uL (ref 4.0–10.5)
nRBC: 0 % (ref 0.0–0.2)

## 2022-05-04 MED ORDER — RIVAROXABAN 10 MG PO TABS: 10.0000 mg | ORAL_TABLET | Freq: Every day | ORAL | 0 refills | Status: DC

## 2022-05-04 NOTE — Discharge Summary (Signed)
Physician Discharge Summary  Patient ID: Tiffany [name] MRN: [id number] DOB/AGE: October 05, 1960 61 y.o.  Admit date: 05/03/2022 Discharge date: 05/04/2022  Admission Diagnoses: Gynecologic malignancy [facility])  Discharge Diagnoses:  Principal Problem:   Gynecologic malignancy [facility])   Discharged Condition:  The patient is in good condition and stable for discharge.    Hospital Course: On 05/03/2022, the patient underwent the following: Procedure(s): XI ROBOTIC ASSISTED hysterectomy BILATERAL SALPINGO OOPHORECTOMY WITH OMENTECTOMY AND DEBULKING, LAPAROTOMY CYSTOSCOPY. The postoperative course was uneventful.  She was discharged to home on postoperative day 1 tolerating a regular diet, ambulating, pain controlled with oral medications, voiding without difficulty after an active voiding trial and foley removal.   Consults: None  Significant Diagnostic Studies: Am labs  Treatments: Surgery: see above  Discharge Exam: Blood pressure 137/82, pulse 85, temperature 98.2 F (36.8 C), temperature source Oral, resp. rate 17, height '5\' 3"'$  (1.6 m), weight 134 lb (60.8 kg), SpO2 98 %. General appearance: alert, cooperative, and no distress Resp: clear to auscultation bilaterally Cardio: regular rate and rhythm, S1, S2 normal, no murmur, click, rub or gallop GI: soft, non-tender; bowel sounds normal; no masses,  no organomegaly Extremities: extremities normal, atraumatic, no cyanosis or edema Incision/Wound: lap sites to the abdomen intact with dermabond, mini laparotomy incision with op site dressing intact with no drainage noted underneath. Foley in place with clear yellow urine on am assessment. To be removed for active voiding trial later this am.  Disposition: Discharge disposition: 01-Home or Self Care       Discharge Instructions     Call MD for:  difficulty breathing, headache or visual disturbances   Complete by: As directed    Call MD for:  extreme fatigue   Complete by: As directed     Call MD for:  hives   Complete by: As directed    Call MD for:  persistant dizziness or light-headedness   Complete by: As directed    Call MD for:  persistant nausea and vomiting   Complete by: As directed    Call MD for:  redness, tenderness, or signs of infection (pain, swelling, redness, odor or green/yellow discharge around incision site)   Complete by: As directed    Call MD for:  severe uncontrolled pain   Complete by: As directed    Call MD for:  temperature >100.4   Complete by: As directed    Diet - low sodium heart healthy   Complete by: As directed    Discharge wound care:   Complete by: As directed    You will have a white honeycomb dressing over your larger incision. This dressing can be removed 5 days after surgery and you do not need to reapply a new dressing. Once you remove the dressing, you will notice that you have the surgical glue (dermabond) on the incision and this will peel off on its own. You can get this dressing wet in the shower the days after surgery prior to removal on the 5th day.   Driving Restrictions   Complete by: As directed    No driving for around 1 week(s).  Do not take narcotics and drive. You need to make sure your reaction time has returned.   Increase activity slowly   Complete by: As directed    Lifting restrictions   Complete by: As directed    No lifting greater than 10 lbs, pushing, pulling, straining for 6 weeks.   Sexual Activity Restrictions   Complete by: As directed  No sexual activity, nothing in the vagina, for 8-10 weeks.      Allergies as of 05/04/2022       Reactions   Seasonal Ic [octacosanol] Cough   Coughing, sneezing    Latex Rash        Medication List     TAKE these medications    dexamethasone 4 MG tablet Commonly known as: DECADRON Take 2 tablets by mouth the night before and 2 tabs the morning of chemotherapy, every 3 weeks, for 6 cycles   ibuprofen 800 MG tablet Commonly known as: ADVIL Take 1  tablet (800 mg total) by mouth every 8 (eight) hours as needed for moderate pain. For AFTER surgery only   multivitamin with minerals Tabs tablet Take 1 tablet by mouth daily.   oxyCODONE 5 MG immediate release tablet Commonly known as: Oxy IR/ROXICODONE Take 1 tablet (5 mg total) by mouth every 4 (four) hours as needed for severe pain. For AFTER surgery only, do not take and drive   rivaroxaban 10 MG Tabs tablet Commonly known as: XARELTO Take 1 tablet (10 mg total) by mouth daily. For AFTER surgery only, Start taking on November 17 Start taking on: May 05, 2022 What changed: additional instructions   Senexon-S 8.6-50 MG tablet Generic drug: senna-docusate Take 2 tablets by mouth at bedtime. For AFTER surgery, do not take if having diarrhea   Vitamin C Chew Chew 1 tablet by mouth daily.   VITAMIN D3 PO Take 1 tablet by mouth daily.               Discharge Care Instructions  (From admission, onward)           Start     Ordered   05/04/22 0000  Discharge wound care:       Comments: You will have a white honeycomb dressing over your larger incision. This dressing can be removed 5 days after surgery and you do not need to reapply a new dressing. Once you remove the dressing, you will notice that you have the surgical glue (dermabond) on the incision and this will peel off on its own. You can get this dressing wet in the shower the days after surgery prior to removal on the 5th day.   05/04/22 8889            Follow-up Information     [name], MD Follow up on 05/10/2022.   Specialty: Gynecologic Oncology Why: at 4:40pm will bea  PHONE visit to check in and discuss pathology. IN PERSON visit will be on 05/26/2022 at 3:15pm at the [facility] for post-op check. Contact information: [place] [place] [address] [phone]                 Greater than thirty minutes were spend for face to face discharge instructions and  discharge orders/summary in EPIC.   Signed: Dorothyann Gibbs 05/04/2022, 12:55 PM

## 2022-05-04 NOTE — Progress Notes (Signed)
Mobility Specialist - Progress Note   05/04/22 0918  Mobility  Activity Ambulated independently in hallway  Level of Assistance Independent  Distance Ambulated (ft) 250 ft  Activity Response Tolerated well  Mobility Referral Yes  $Mobility charge 1 Mobility   Pt received in bed and agreeable to mobility. C/o of pain below the ribs. Nurse notified. Per nurse request, pt to recliner after session with all needs met & son in room.    [facility]

## 2022-05-04 NOTE — Plan of Care (Signed)

## 2022-05-04 NOTE — Progress Notes (Signed)
1 Day Post-Op Procedure(s) (LRB): XI ROBOTIC ASSISTED hysterectomy BILATERAL SALPINGO OOPHORECTOMY WITH OMENTECTOMY AND DEBULKING, LAPAROTOMY (N/A) CYSTOSCOPY (N/A)  Subjective: Patient reports doing well this am. She has only taken in water. No nausea or emesis reported. Pain managed with prn medications. Attempted to get out of bed once and reported soreness. Son at the bedside. No concerns voiced. All questions answered.  Objective: Vital signs in last 24 hours: Temp:  [97.8 F (36.6 C)-100.6 F (38.1 C)] 98.2 F (36.8 C) (11/16 0952) Pulse Rate:  [78-104] 85 (11/16 0952) Resp:  [11-20] 17 (11/16 0952) BP: (135-184)/(82-106) 137/82 (11/16 0952) SpO2:  [97 %-100 %] 98 % (11/16 0952) Weight:  [134 lb (60.8 kg)] 134 lb (60.8 kg) (11/15 1110) Last BM Date : 05/02/22  Intake/Output from previous day: 11/15 0701 - 11/16 0700 In: 2310 [P.O.:360; I.V.:1850; IV Piggyback:100] Out: 2000 [Urine:1800; Blood:200]  Physical Examination (performed by Dr. Berline Lopes): General appearance: alert, cooperative, and no distress Resp: clear to auscultation bilaterally Cardio: regular rate and rhythm, S1, S2 normal, no murmur, click, rub or gallop GI: soft, non-tender; bowel sounds normal; no masses,  no organomegaly Extremities: extremities normal, atraumatic, no cyanosis or edema Incision/Wound: lap sites to the abdomen intact with dermabond, mini laparotomy incision with op site dressing intact with no drainage noted underneath. Foley in place with clear yellow urine  Labs: WBC/Hgb/Hct/Plts:  10.1/11.8/35.2/141 (11/16 0431) BUN/Cr/glu/ALT/AST/amyl/lip:  13/0.68/--/--/--/--/-- (11/16 4315)  Assessment: 61 y.o. s/p Procedure(s): XI ROBOTIC ASSISTED hysterectomy BILATERAL SALPINGO OOPHORECTOMY WITH OMENTECTOMY AND DEBULKING, LAPAROTOMY, CYSTOSCOPY: stable Pain:  Pain is well-controlled on PRN medications.  Heme: Hgb 11.8 and Hct 35.2 this am-appropriate given preop values and surgical  losses.  ID: WBC 10.1. No evidence of infection. Given decadron intra-op.  CV: BP and HR stable- continue to monitor with routine vital signs until discharge.  GI:  Tolerating po: tolerating sips of water. Encouraged diet advancement. Antiemetics ordered if needed.  GU: Foley in place. For voiding trial this am. Creatinine 0.68 this am.    FEN: No critical values on am Bmet  Prophylaxis: SCDs and lovenox ordered.  Plan: Plan for active voiding trial this am. If unable to void, plan for foley replacement Diet as tolerated If diet tolerated, can saline lock If ambulating, tolerating diet, pain controlled, plan for discharge later today Letter written in EPIC per son and patient request Continue plan of care per Dr. Berline Lopes   LOS: 0 days    Lenna Sciara D Merrel Crabbe 05/04/2022, 10:08 AM

## 2022-05-05 ENCOUNTER — Encounter: Payer: Self-pay | Admitting: Gynecologic Oncology

## 2022-05-05 ENCOUNTER — Telehealth: Payer: Self-pay | Admitting: Licensed Clinical Social Worker

## 2022-05-05 ENCOUNTER — Other Ambulatory Visit: Payer: Self-pay | Admitting: Hematology and Oncology

## 2022-05-05 ENCOUNTER — Inpatient Hospital Stay (HOSPITAL_BASED_OUTPATIENT_CLINIC_OR_DEPARTMENT_OTHER): Payer: No Typology Code available for payment source | Admitting: Gynecologic Oncology

## 2022-05-05 ENCOUNTER — Telehealth: Payer: Self-pay | Admitting: Gynecologic Oncology

## 2022-05-05 ENCOUNTER — Encounter: Payer: Self-pay | Admitting: Licensed Clinical Social Worker

## 2022-05-05 ENCOUNTER — Telehealth: Payer: Self-pay | Admitting: Surgery

## 2022-05-05 ENCOUNTER — Ambulatory Visit: Payer: Self-pay | Admitting: Licensed Clinical Social Worker

## 2022-05-05 DIAGNOSIS — Z1379 Encounter for other screening for genetic and chromosomal anomalies: Secondary | ICD-10-CM | POA: Insufficient documentation

## 2022-05-05 DIAGNOSIS — Z9889 Other specified postprocedural states: Secondary | ICD-10-CM

## 2022-05-05 DIAGNOSIS — C579 Malignant neoplasm of female genital organ, unspecified: Secondary | ICD-10-CM

## 2022-05-05 DIAGNOSIS — Z7189 Other specified counseling: Secondary | ICD-10-CM

## 2022-05-05 LAB — SURGICAL PATHOLOGY

## 2022-05-05 NOTE — Progress Notes (Signed)
Gynecologic Oncology Telehealth Note: Gyn-Onc  I connected with Tiffany [name] on 05/05/22 at  1:00 PM EST by telephone and verified that I am speaking with the correct person using two identifiers.  I discussed the limitations, risks, security and privacy concerns of performing an evaluation and management service by telemedicine and the availability of in-person appointments. I also discussed with the patient that there may be a patient responsible charge related to this service. The patient expressed understanding and agreed to proceed.  Other persons participating in the visit and their role in the encounter: None.  Patient's location: Home Provider's location: [facility]   Reason for Visit: Follow-up after surgery  Treatment History: Oncology History Overview Note  HER2 negative   Uterine cancer ([place])  12/03/2021 Initial Diagnosis   Patient reports several month history of intermittent pelvic pain that she describes as discomfort or sometimes cramping, that has worsened with time.  She notes that this is tolerable.  She began having postmenopausal bleeding started on 6/17 with a few spots of blood.     12/29/2021 Imaging   1. Bilateral large multiloculated cystic lesions of the ovaries, concerning for ovarian malignancy. Recommend gynecologic consultation and pelvic ultrasound and/or pelvic MRI with contrast for further evaluation. 2. Large heterogeneous mass of the posterior pelvis likely arising from the lower uterus, possibly a fibroid, although malignancy is also a concern. Recommend attention on pelvic ultrasound or MRI as above. 3. Additional mass arising from the right uterine fundus which correlates with fibroid described on prior 2015 ultrasound. 4. Nonspecific small ground-glass nodule of the right lower lobe measuring 6 mm. Recommend follow-up chest CT in 6 months for further evaluation. 5. Aortic Atherosclerosis (ICD10-I70.0).   12/30/2021 Pathology Results   A. CERVIX,  BIOPSY:  -  Scantly cellular specimen with predominantly blood, fibrin and acute inflammatory infiltrate but with scattered highly atypical cells highly suspicious for malignancy.   Note: A p16 shows strong positivity in these scattered cells; however, while p53 is mildly increased definitive strong overexpression or under expression is not identified.  These findings support the suspicion for malignancy; however, are insufficient for a definitive diagnosis.     01/03/2022 Imaging   MRI pelvis  1. Large mixed solid and cystic lesions of the bilateral ovaries, measuring 8.4 x 7.0 x 5.4 cm on the right and 10.3 x 8.7 x 8.1 cm on the left, highly concerning for primary ovarian malignancy and contralateral ovarian metastasis.   2. Homogeneously enhancing mass arising from the right aspect of the uterine body and fundus measuring 6.0 x 5.5 x 4.3 cm, consistent with a uterine fibroid and similar to prior ultrasound dated 2015.   3. An additional, much more heterogeneous mass which appears to arise from the cervix or posterior lower uterine segment measuring 8.1 x 6.4 x 6.2 cm. This may reflect an additional fibroid with internal degeneration, however this was not clearly visualized on ultrasound dated 2015 and is highly suspicious for a cervical mass as significant enlargement of benign fibroids is not expected following menopause. This mass appears to closely abut and compress or perhaps even directly involve the adjacent rectum.   4.  No evidence of lymphadenopathy in the pelvis.   01/12/2022 PET scan   1. Hypermetabolic heterogeneous mass centered on the cervix/lower uterine segment is most consistent with primary gynecologic neoplasm. 2. Multiloculated complex bilateral adnexal cystic lesions with hypermetabolic soft tissue component likely reflect metastatic lesions. 3. No hypermetabolic abdominopelvic adenopathy. 4. No evidence of hypermetabolic metastatic  disease in the chest or abdomen.    01/18/2022 Pathology Results   FINAL MICROSCOPIC DIAGNOSIS:   A. CERVICAL MASS, BIOPSIES:  - Poorly differentiated carcinoma with necrosis, consistent with high-grade serous carcinoma, see comment   B. CERVICAL MASS, EXCISION:  - Poorly differentiated carcinoma with necrosis, consistent with high-grade serous carcinoma see comment  - Edges of resected tissue fragments are positive for carcinoma   COMMENT:   A and B.   The tumor cells are positive for PAX8, ER and p16 immunostains.  Immunostain for p53 shows significant overexpression. Immunostain for CK5/6 shows weak focal labeling while immunostain for  p63 is negative.  The immunoprofile is consistent with above interpretation.  The carcinoma is likely endometrial or ovarian origin with secondary involvement of the cervix.     01/23/2022 Initial Diagnosis   Uterine cancer ([place])   01/23/2022 Cancer Staging   Staging form: Corpus Uteri - Carcinoma and Carcinosarcoma, AJCC 8th Edition - Clinical stage from 01/23/2022: FIGO Stage IIIA (cT3a, cN0, cM0) - Signed by [name], MD on 01/23/2022 Stage prefix: Initial diagnosis   01/31/2022 - 01/31/2022 Chemotherapy   Patient is on Treatment Plan : UTERINE ENDOMETRIAL Dostarlimab-gxly (500 mg) + Carboplatin (AUC 5) + Paclitaxel (175 mg/m2) q21d x 6 cycles / Dostarlimab-gxly (1000 mg) q42d x 6 cycles      01/31/2022 -  Chemotherapy   Patient is on Treatment Plan : UTERINE ENDOMETRIAL Dostarlimab-gxly (500 mg) + Carboplatin (AUC 5) + Paclitaxel (175 mg/m2) q21d x 6 cycles / Dostarlimab-gxly (1000 mg) q42d x 6 cycles      04/14/2022 PET scan   1. Significant interval positive response to therapy. Mildly hypermetabolic solid 5.5 x 2.8 cm posterior uterine cervix mass and mildly hypermetabolic bilateral adnexal mixed cystic and solid masses are all significantly decreased in size and metabolism. 2. No new or progressive hypermetabolic metastatic disease. 3. Low level FDG uptake associated with healing  subacute sclerotic nondisplaced anterior left third, fourth and fifth rib fractures without associated discrete osseous lesions, correlate for history of interval injury in this location. 4. Horseshoe kidney.     Interval History: Doing well, continues to have some soreness but feels that things are improving.  Past Medical/Surgical History: Past Medical History:  Diagnosis Date   Anxiety    Depression    GERD (gastroesophageal reflux disease)    Hypothyroidism    not on medications currently    Past Surgical History:  Procedure Laterality Date   CESAREAN SECTION     CYSTOSCOPY N/A 05/03/2022   Procedure: CYSTOSCOPY;  Surgeon: [name], MD;  Location: [facility];  Service: Gynecology;  Laterality: N/A;   DILATION AND CURETTAGE OF UTERUS     61yo for SAB   IR IMAGING GUIDED PORT INSERTION  01/25/2022   LESION REMOVAL N/A 01/18/2022   Procedure: CERVICAL BIOPSIES;  Surgeon: [name], MD;  Location: [facility];  Service: Gynecology;  Laterality: N/A;    Family History  Problem Relation Age of Onset   Thyroid disease Mother    Breast cancer Mother        dx late 40s   Cancer Other        unk type, d. 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  Tobacco Use   Smoking status: Never   Smokeless tobacco: Never  Vaping Use   Vaping Use: Never used  Substance and Sexual Activity   Alcohol use: Never   Drug use: Never   Sexual activity: Not Currently  Other Topics Concern   Not on file  Social History Narrative   Not on file   Social Determinants of Health   Financial Resource Strain:  Not on file  Food Insecurity: No Food Insecurity (05/03/2022)   Hunger Vital Sign    Worried About Running Out of Food in the Last Year: Never true    Ran Out of Food in the Last Year: Never true  Transportation Needs: No Transportation Needs (05/03/2022)   PRAPARE - Hydrologist (Medical): No    Lack of Transportation (Non-Medical): No  Physical Activity: Not on file  Stress: Not on file  Social Connections: Not on file    Current Medications:  Current Outpatient Medications:    Bioflavonoid Products (VITAMIN C) CHEW, Chew 1 tablet by mouth daily., Disp: , Rfl:    Cholecalciferol (VITAMIN D3 PO), Take 1 tablet by mouth daily., Disp: , Rfl:    dexamethasone (DECADRON) 4 MG tablet, Take 2 tablets by mouth the night before and 2 tabs the morning of chemotherapy, every 3 weeks, for 6 cycles (Patient not taking: Reported on 04/25/2022), Disp: 36 tablet, Rfl: 6   ibuprofen (ADVIL) 800 MG tablet, Take 1 tablet (800 mg total) by mouth every 8 (eight) hours as needed for moderate pain. For AFTER surgery only, Disp: 30 tablet, Rfl: 0   Multiple Vitamin (MULTIVITAMIN WITH MINERALS) TABS tablet, Take 1 tablet by mouth daily., Disp: , Rfl:    oxyCODONE (OXY IR/ROXICODONE) 5 MG immediate release tablet, Take 1 tablet (5 mg total) by mouth every 4 (four) hours as needed for severe pain. For AFTER surgery only, do not take and drive, Disp: 10 tablet, Rfl: 0   rivaroxaban (XARELTO) 10 MG TABS tablet, Take 1 tablet (10 mg total) by mouth daily. For AFTER surgery only, Start taking on November 17, Disp: 14 tablet, Rfl: 0   senna-docusate (SENEXON-S) 8.6-50 MG tablet, Take 2 tablets by mouth at bedtime. For AFTER surgery, do not take if having diarrhea, Disp: 30 tablet, Rfl: 0  Review of Symptoms: Pertinent positives as per HPI.  Physical Exam: Deferred given limitations of phone visit.  Laboratory & Radiologic Studies: Pathology as noted above  Assessment & Plan: Tiffany [name] is a 61 y.o. 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  Patient is doing well after surgery, discussed continued expectations.  Reviewed pathology from surgery.  Everything removed at the time of surgery was negative for any malignancy.  I discussed with her again finding of the ridge of fibrosis above the rectum, that appeared more consistent with treated tumor than active tumor.  This was not contiguous with the cervix but right above the rectum itself.  Discussed with her plan to get an MRI postop, ideally 3-4 weeks after surgery to allow for some healing.  I discussed the assessment and treatment plan with the patient. The patient was provided with an opportunity to ask questions and all were answered. The patient  agreed with the plan and demonstrated an understanding of the instructions.   The patient was advised to call back or see an in-person evaluation if the symptoms worsen or if the condition fails to improve as anticipated.   12 minutes of total time was spent for this patient encounter, including preparation, phone counseling with the patient and coordination of care, and documentation of the encounter.   Jeral Pinch, MD  Division of Gynecologic Oncology  Department of Obstetrics and Gynecology  [facility]

## 2022-05-05 NOTE — Telephone Encounter (Signed)
I contacted Ms. Goodenow to discuss her genetic testing results. No pathogenic variants were identified in the 77 genes analyzed. Detailed clinic note to follow.   The test report has been scanned into EPIC and is located under the Molecular Pathology section of the Results Review tab.  A portion of the result report is included below for reference.      Faith Rogue, MS, [facility] Genetic Counselor [email or web address] Phone: [phone]

## 2022-05-05 NOTE — Telephone Encounter (Addendum)
Spoke with Ms. Seales this morning. She states she is eating, drinking and urinating well. She has not had a BM yet but is passing gas. She is taking senokot as prescribed and encouraged her to drink plenty of water. She denies fever or chills. Incisions are dry and intact. She rates her pain 6/10. Her pain is controlled with Ibuprofen and Oxycodone. Confirmed with patient that she started taking her Xarelto prescription today.   Instructed to call office with any fever, chills, purulent drainage, uncontrolled pain or any other questions or concerns. Patient verbalizes understanding.   Pt aware of post op appointments as well as the office number [phone] and after hours number [phone] to call if she has any questions or concerns

## 2022-05-05 NOTE — Telephone Encounter (Signed)
See phone note

## 2022-05-05 NOTE — Progress Notes (Signed)
HPI:   Ms. Cammarata was previously seen in the [place] clinic due to a personal and family history of cancer and concerns regarding a hereditary predisposition to cancer. Please refer to our prior cancer genetics clinic note for more information regarding our discussion, assessment and recommendations, at the time. Ms. Kohn recent genetic test results were disclosed to her, as were recommendations warranted by these results. These results and recommendations are discussed in more detail below.  CANCER HISTORY:  Oncology History Overview Note  HER2 negative   Uterine cancer ([place])  12/03/2021 Initial Diagnosis   Patient reports several month history of intermittent pelvic pain that she describes as discomfort or sometimes cramping, that has worsened with time.  She notes that this is tolerable.  She began having postmenopausal bleeding started on 6/17 with a few spots of blood.     12/29/2021 Imaging   1. Bilateral large multiloculated cystic lesions of the ovaries, concerning for ovarian malignancy. Recommend gynecologic consultation and pelvic ultrasound and/or pelvic MRI with contrast for further evaluation. 2. Large heterogeneous mass of the posterior pelvis likely arising from the lower uterus, possibly a fibroid, although malignancy is also a concern. Recommend attention on pelvic ultrasound or MRI as above. 3. Additional mass arising from the right uterine fundus which correlates with fibroid described on prior 2015 ultrasound. 4. Nonspecific small ground-glass nodule of the right lower lobe measuring 6 mm. Recommend follow-up chest CT in 6 months for further evaluation. 5. Aortic Atherosclerosis (ICD10-I70.0).   12/30/2021 Pathology Results   A. CERVIX, BIOPSY:  -  Scantly cellular specimen with predominantly blood, fibrin and acute inflammatory infiltrate but with scattered highly atypical cells highly suspicious for malignancy.   Note: A p16 shows strong positivity in  these scattered cells; however, while p53 is mildly increased definitive strong overexpression or under expression is not identified.  These findings support the suspicion for malignancy; however, are insufficient for a definitive diagnosis.     01/03/2022 Imaging   MRI pelvis  1. Large mixed solid and cystic lesions of the bilateral ovaries, measuring 8.4 x 7.0 x 5.4 cm on the right and 10.3 x 8.7 x 8.1 cm on the left, highly concerning for primary ovarian malignancy and contralateral ovarian metastasis.   2. Homogeneously enhancing mass arising from the right aspect of the uterine body and fundus measuring 6.0 x 5.5 x 4.3 cm, consistent with a uterine fibroid and similar to prior ultrasound dated 2015.   3. An additional, much more heterogeneous mass which appears to arise from the cervix or posterior lower uterine segment measuring 8.1 x 6.4 x 6.2 cm. This may reflect an additional fibroid with internal degeneration, however this was not clearly visualized on ultrasound dated 2015 and is highly suspicious for a cervical mass as significant enlargement of benign fibroids is not expected following menopause. This mass appears to closely abut and compress or perhaps even directly involve the adjacent rectum.   4.  No evidence of lymphadenopathy in the pelvis.   01/12/2022 PET scan   1. Hypermetabolic heterogeneous mass centered on the cervix/lower uterine segment is most consistent with primary gynecologic neoplasm. 2. Multiloculated complex bilateral adnexal cystic lesions with hypermetabolic soft tissue component likely reflect metastatic lesions. 3. No hypermetabolic abdominopelvic adenopathy. 4. No evidence of hypermetabolic metastatic disease in the chest or abdomen.   01/18/2022 Pathology Results   FINAL MICROSCOPIC DIAGNOSIS:   A. CERVICAL MASS, BIOPSIES:  - Poorly differentiated carcinoma with necrosis, consistent with high-grade serous  carcinoma, see comment   B. CERVICAL MASS,  EXCISION:  - Poorly differentiated carcinoma with necrosis, consistent with high-grade serous carcinoma see comment  - Edges of resected tissue fragments are positive for carcinoma   COMMENT:   A and B.   The tumor cells are positive for PAX8, ER and p16 immunostains.  Immunostain for p53 shows significant overexpression. Immunostain for CK5/6 shows weak focal labeling while immunostain for  p63 is negative.  The immunoprofile is consistent with above interpretation.  The carcinoma is likely endometrial or ovarian origin with secondary involvement of the cervix.     01/23/2022 Initial Diagnosis   Uterine cancer ([place])   01/23/2022 Cancer Staging   Staging form: Corpus Uteri - Carcinoma and Carcinosarcoma, AJCC 8th Edition - Clinical stage from 01/23/2022: FIGO Stage IIIA (cT3a, cN0, cM0) - Signed by [name], MD on 01/23/2022 Stage prefix: Initial diagnosis   01/31/2022 - 01/31/2022 Chemotherapy   Patient is on Treatment Plan : UTERINE ENDOMETRIAL Dostarlimab-gxly (500 mg) + Carboplatin (AUC 5) + Paclitaxel (175 mg/m2) q21d x 6 cycles / Dostarlimab-gxly (1000 mg) q42d x 6 cycles      01/31/2022 -  Chemotherapy   Patient is on Treatment Plan : UTERINE ENDOMETRIAL Dostarlimab-gxly (500 mg) + Carboplatin (AUC 5) + Paclitaxel (175 mg/m2) q21d x 6 cycles / Dostarlimab-gxly (1000 mg) q42d x 6 cycles      04/14/2022 PET scan   1. Significant interval positive response to therapy. Mildly hypermetabolic solid 5.5 x 2.8 cm posterior uterine cervix mass and mildly hypermetabolic bilateral adnexal mixed cystic and solid masses are all significantly decreased in size and metabolism. 2. No new or progressive hypermetabolic metastatic disease. 3. Low level FDG uptake associated with healing subacute sclerotic nondisplaced anterior left third, fourth and fifth rib fractures without associated discrete osseous lesions, correlate for history of interval injury in this location. 4. Horseshoe kidney.   05/03/2022  Surgery   Robotic-assisted laparoscopic total hysterectomy with bilateral salpingo-oophorectomy, tumor debulking including infracolic omentectomy, mini-lap, cystoscopy, and vaginal laceration repair   Findings:  On speculum exam, normal-appearing cervix that is flush with the vaginal apex.  No visible cervical lesion.  On EUA, moderately mobile, enlarged uterus with mobile fullness appreciated in the cul-de-sac.  On intra-abdominal entry, normal upper abdominal survey.  Some adhesions of the omentum to the anterior abdominal wall below the umbilicus were noted.  Normal-appearing omentum, small and large bowel.  No ascites.  Right ovary minimally enlarged but overall normal in appearance.  Left ovary replaced by a 6 cm cystic mass, smooth.  Normal-appearing bilateral fallopian tubes.  Bladder was somewhat adherent anteriorly to the cervix.  Uterus approximately 8 cm and deviated to the left given a 10 cm anterior and right-sided fibroid.  The cervix itself did not appear enlarged, either on intra-abdominal phonation or speculum exam.  Posteriorly, a white ridge of somewhat indurated tissue was noted underneath the cervix and just above the rectum.  On palpation, this ridge could be appreciated only on rectovaginal exam.  It was separate and free from the cervix itself.  It was difficult to distinguish whether this was treated tumor or active tumor although its detached location from the cervix and uterus was noted.  Given my conversation with the patient prior to surgery and that she would not want an ostomy (even temporarily), there was no way to resect this area out without significant risk of damage to the rectum.  Given its location, even in the setting of a bowel  prep (which she had not had), she would likely require at least a protective diverting ostomy. Cystoscopy, bladder dome noted to be intact and good efflux seen from bilateral ureteral orifices.   05/03/2022 Pathology Results   A. UTERUS, CERVIX,  BILATERAL TUBES AND OVARIES, RESECTION:  - Cervix      Nabothian cysts      Negative for malignancy  - Endometrium      Inactive endometrium      Two benign endometrial polyps      Negative for malignancy  - Myometrium      Leiomyoma      Negative for malignancy  - Right ovary      Hemorrhagic cyst with fibrosis, hemosiderin and focal calcification      Negative for malignancy  - Left ovary      Hemorrhagic cyst with fibrosis, hemosiderin and focal calcification      Negative for malignancy  - Bilateral fallopian tubes      Unremarkable      Negative for malignancy  - See oncology table   B. OMENTUM, EXCISION:  - Benign omental adipose tissue.  - Negative for malignancy.     Genetic Testing   Negative genetic testing. No pathogenic variants identified on the [facility] CancerNext-Expanded+RNA panel. 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 The report date is 05/03/2022.  The CancerNext-Expanded + RNAinsight gene panel offered by [employer] and includes sequencing and rearrangement analysis for the following 77 genes: IP, ALK, APC*, ATM*, AXIN2, BAP1, BARD1, BLM, BMPR1A, BRCA1*, BRCA2*, BRIP1*, CDC73, CDH1*,CDK4, CDKN1B, CDKN2A, CHEK2*, CTNNA1, DICER1, FANCC, FH, FLCN, GALNT12, KIF1B, LZTR1, MAX, MEN1, MET, MLH1*, MSH2*, MSH3, MSH6*, MUTYH*, NBN, NF1*, NF2, NTHL1, PALB2*, PHOX2B, PMS2*, POT1, PRKAR1A, PTCH1, PTEN*, RAD51C*, RAD51D*,RB1, RECQL, RET, SDHA, SDHAF2, SDHB, SDHC, SDHD, SMAD4, SMARCA4, SMARCB1, SMARCE1, STK11, SUFU, TMEM127, TP53*,TSC1, TSC2, VHL and XRCC2 (sequencing and deletion/duplication); EGFR, EGLN1, HOXB13, KIT, MITF, PDGFRA, POLD1 and POLE (sequencing only); EPCAM and GREM1 (deletion/duplication only).     FAMILY HISTORY:  We obtained a detailed, 4-generation family history.  Significant diagnoses are listed below: Family History  Problem Relation Age of Onset   Thyroid disease Mother    Breast cancer Mother        dx late 4s   Cancer Other        unk type, d.  33s-40s   Colon cancer Neg Hx    Ovarian cancer Neg Hx    Endometrial cancer Neg Hx    Pancreatic cancer Neg Hx    Prostate cancer Neg Hx    Ms. Remmert has 1 son, 23. She has 2 brothers, 77 and 33. No cancer history.   Ms. Kavanaugh mother had breast cancer in her late 68s and had metastatic recurrence later in life, she passed at 79. Maternal great uncle had cancer, unknown type, and passed in his 56s-40s.    Ms. Narducci father is living at 6 and has no cancer history. No known cancers on this side of the family.   Ms. Babinski is unaware of previous family history of genetic testing for hereditary cancer risks. There is no reported Ashkenazi Jewish ancestry. There is no known consanguinity.    GENETIC TEST RESULTS:  The [facility] CancerNext-Expanded+RNA Panel found no pathogenic mutations.   The CancerNext-Expanded + RNAinsight gene panel offered by [employer] and includes sequencing and rearrangement analysis for the following 77 genes: IP, ALK, APC*, ATM*, AXIN2, BAP1, BARD1, BLM, BMPR1A, BRCA1*, BRCA2*, BRIP1*, CDC73, CDH1*,CDK4, CDKN1B, CDKN2A, CHEK2*, CTNNA1, DICER1, FANCC,  FH, FLCN, GALNT12, KIF1B, LZTR1, MAX, MEN1, MET, MLH1*, MSH2*, MSH3, MSH6*, MUTYH*, NBN, NF1*, NF2, NTHL1, PALB2*, PHOX2B, PMS2*, POT1, PRKAR1A, PTCH1, PTEN*, RAD51C*, RAD51D*,RB1, RECQL, RET, SDHA, SDHAF2, SDHB, SDHC, SDHD, SMAD4, SMARCA4, SMARCB1, SMARCE1, STK11, SUFU, TMEM127, TP53*,TSC1, TSC2, VHL and XRCC2 (sequencing and deletion/duplication); EGFR, EGLN1, HOXB13, KIT, MITF, PDGFRA, POLD1 and POLE (sequencing only); EPCAM and GREM1 (deletion/duplication only).   The test report has been scanned into EPIC and is located under the Molecular Pathology section of the Results Review tab.  A portion of the result report is included below for reference. Genetic testing reported out on 05/03/2022.      Genetic testing identified a variant of uncertain significance (VUS) in the SDHA gene called [id number].E291A.  At this time, it is  unknown if this variant is associated with an increased risk for cancer or if it is benign, but most uncertain variants are reclassified to benign. It should not be used to make medical management decisions. With time, we suspect the laboratory will determine the significance of this variant, if any. If the laboratory reclassifies this variant, we will attempt to contact Ms. Scobey to discuss it further.   Even though a pathogenic variant was not identified, possible explanations for the cancer in the family may include: There may be no hereditary risk for cancer in the family. The cancers in Ms. Helin and/or her family may be sporadic/familial or due to other genetic and environmental factors. There may be a gene mutation in one of these genes that current testing methods cannot detect but that chance is small. There could be another gene that has not yet been discovered, or that we have not yet tested, that is responsible for the cancer diagnoses in the family.  It is also possible there is a hereditary cause for the cancer in the family that Ms. Sykora did not inherit.  Therefore, it is important to remain in touch with cancer genetics in the future so that we can continue to offer Ms. Skalsky the most up to date genetic testing.   ADDITIONAL GENETIC TESTING:  We discussed with Ms. Wigington that her genetic testing was fairly extensive.  If there are additional relevant genes identified to increase cancer risk that can be analyzed in the future, we would be happy to discuss and coordinate this testing at that time.    CANCER SCREENING RECOMMENDATIONS:  Ms. Gadea test result is considered negative (normal).  This means that we have not identified a hereditary cause for her personal and family history of cancer at this time.   An individual's cancer risk and medical management are not determined by genetic test results alone. Overall cancer risk assessment incorporates additional factors, including personal  medical history, family history, and any available genetic information that may result in a personalized plan for cancer prevention and surveillance. Therefore, it is recommended she continue to follow the cancer management and screening guidelines provided by her oncology and primary healthcare provider.  RECOMMENDATIONS FOR FAMILY MEMBERS:   Since she did not inherit a identifiable mutation in a cancer predisposition gene included on this panel, her children could not have inherited a known mutation from her in one of these genes. Individuals in this family might be at some increased risk of developing cancer, over the general population risk, due to the family history of cancer.  Individuals in the family should notify their providers of the family history of cancer. We recommend women in this family have a yearly mammogram beginning  at age 69, or 46 years younger than the earliest onset of cancer, an annual clinical breast exam, and perform monthly breast self-exams.  Family members should have colonoscopies by at age 28, or earlier, as recommended by their providers. We do not recommend familial testing for the SDHA variant of uncertain significance (VUS).  FOLLOW-UP:  Lastly, we discussed with Ms. Pyatt that cancer genetics is a rapidly advancing field and it is possible that new genetic tests will be appropriate for her and/or her family members in the future. We encouraged her to remain in contact with cancer genetics on an annual basis so we can update her personal and family histories and let her know of advances in cancer genetics that may benefit this family.   Our contact number was provided. Ms. Seiter questions were answered to her satisfaction, and she knows she is welcome to call us at anytime with additional questions or concerns.    Faith Rogue, MS, [facility] Genetic Counselor [email or web address] Phone: [phone]

## 2022-05-08 ENCOUNTER — Telehealth: Payer: Self-pay

## 2022-05-08 NOTE — Telephone Encounter (Signed)
Office received a telephone advise record stating pt called after hours triage line on 05/06/22.   Pt states at the time of the call on the weekend, she felt she needed to cough/sneeze but when she tried she was having pain at the surgery site. She states she is not having to take the Oxycodone only the Ibuprofen/Tylenol. She followed the advise of the triage nurse by drinking hot tea w/lemon or water. She is feeling much better and understands the pain is because she is healing.   Pt asked about the Xarelto and how long after surgery she should continue taking it. Per Dr. Ernestina Patches, take it the full 14 days. Pt voiced an understanding and will call if notices fever >100.4, difficulty breathing or pain occurs that pain regimen does not get rid of.   Post op appointment reviewed with the patient.  No other questions/concerns at this time.

## 2022-05-09 ENCOUNTER — Telehealth: Payer: Self-pay | Admitting: *Deleted

## 2022-05-09 ENCOUNTER — Encounter: Payer: Self-pay | Admitting: Hematology and Oncology

## 2022-05-09 NOTE — Telephone Encounter (Signed)
Per Dr Berline Lopes patient scheduled for a MRI on 12/13 at 9 am, patient aware

## 2022-05-09 NOTE — Anesthesia Postprocedure Evaluation (Signed)
Anesthesia Post Note  Patient: Essynce Kraft  Procedure(s) Performed: XI ROBOTIC ASSISTED hysterectomy BILATERAL SALPINGO OOPHORECTOMY WITH OMENTECTOMY AND DEBULKING, LAPAROTOMY (Abdomen) CYSTOSCOPY     Patient location during evaluation: PACU Anesthesia Type: General Level of consciousness: awake and alert Pain management: pain level controlled Vital Signs Assessment: post-procedure vital signs reviewed and stable Respiratory status: spontaneous breathing, nonlabored ventilation, respiratory function stable and patient connected to nasal cannula oxygen Cardiovascular status: blood pressure returned to baseline and stable Postop Assessment: no apparent nausea or vomiting Anesthetic complications: no   No notable events documented.  Last Vitals:  Vitals:   05/04/22 0509 05/04/22 0952  BP: 135/82 137/82  Pulse: 78 85  Resp: 16 17  Temp: 36.9 C 36.8 C  SpO2: 98% 98%    Last Pain:  Vitals:   05/04/22 1030  TempSrc:   PainSc: 4                  Aylssa Herrig

## 2022-05-10 ENCOUNTER — Other Ambulatory Visit: Payer: Self-pay

## 2022-05-10 ENCOUNTER — Telehealth: Payer: No Typology Code available for payment source | Admitting: Gynecologic Oncology

## 2022-05-11 ENCOUNTER — Other Ambulatory Visit: Payer: Self-pay

## 2022-05-12 ENCOUNTER — Telehealth: Payer: Self-pay | Admitting: Surgery

## 2022-05-12 ENCOUNTER — Other Ambulatory Visit: Payer: Self-pay

## 2022-05-12 NOTE — Telephone Encounter (Signed)
Patient called stating she is having lower abdominal pain beginning yesterday. 3/10. Feels like gas pain. States she is having 2 normal BM's per day, denies any fevers. States she did not eat a heavy meal or overdo it yesterday. Has taken ibuprofen. Denies any vaginal bleeding or discharge and nothing has been inserted into the vagina. States laying on her left side with knees to her chest helps.   Advised patient this is most likely gas and to continue to monitor symptoms and to call our office or go to ER if new or worsening symptoms occur. Patient verbalized understanding and had no other concerns at this time.

## 2022-05-18 ENCOUNTER — Other Ambulatory Visit: Payer: Self-pay

## 2022-05-19 ENCOUNTER — Telehealth: Payer: Self-pay | Admitting: *Deleted

## 2022-05-19 NOTE — Telephone Encounter (Signed)
Spoke with the patient and moved her appt time on 12/8 from 3:15 pm to 11:15 am

## 2022-05-26 ENCOUNTER — Inpatient Hospital Stay: Payer: No Typology Code available for payment source | Attending: Gynecologic Oncology | Admitting: Gynecologic Oncology

## 2022-05-26 ENCOUNTER — Encounter: Payer: Self-pay | Admitting: Gynecologic Oncology

## 2022-05-26 ENCOUNTER — Other Ambulatory Visit (HOSPITAL_COMMUNITY): Payer: Self-pay

## 2022-05-26 VITALS — BP 162/84 | HR 93 | Temp 98.4°F | Resp 16 | Wt 134.0 lb

## 2022-05-26 DIAGNOSIS — Z90722 Acquired absence of ovaries, bilateral: Secondary | ICD-10-CM | POA: Insufficient documentation

## 2022-05-26 DIAGNOSIS — D259 Leiomyoma of uterus, unspecified: Secondary | ICD-10-CM

## 2022-05-26 DIAGNOSIS — Z5112 Encounter for antineoplastic immunotherapy: Secondary | ICD-10-CM | POA: Insufficient documentation

## 2022-05-26 DIAGNOSIS — C55 Malignant neoplasm of uterus, part unspecified: Secondary | ICD-10-CM | POA: Insufficient documentation

## 2022-05-26 DIAGNOSIS — G620 Drug-induced polyneuropathy: Secondary | ICD-10-CM | POA: Insufficient documentation

## 2022-05-26 DIAGNOSIS — B379 Candidiasis, unspecified: Secondary | ICD-10-CM

## 2022-05-26 DIAGNOSIS — Z79899 Other long term (current) drug therapy: Secondary | ICD-10-CM | POA: Insufficient documentation

## 2022-05-26 DIAGNOSIS — Z803 Family history of malignant neoplasm of breast: Secondary | ICD-10-CM | POA: Insufficient documentation

## 2022-05-26 DIAGNOSIS — Z5111 Encounter for antineoplastic chemotherapy: Secondary | ICD-10-CM | POA: Insufficient documentation

## 2022-05-26 DIAGNOSIS — T451X5A Adverse effect of antineoplastic and immunosuppressive drugs, initial encounter: Secondary | ICD-10-CM | POA: Insufficient documentation

## 2022-05-26 DIAGNOSIS — E039 Hypothyroidism, unspecified: Secondary | ICD-10-CM | POA: Insufficient documentation

## 2022-05-26 DIAGNOSIS — Z9079 Acquired absence of other genital organ(s): Secondary | ICD-10-CM | POA: Insufficient documentation

## 2022-05-26 DIAGNOSIS — Z90710 Acquired absence of both cervix and uterus: Secondary | ICD-10-CM | POA: Insufficient documentation

## 2022-05-26 MED ORDER — FLUCONAZOLE 150 MG PO TABS
150.0000 mg | ORAL_TABLET | Freq: Every day | ORAL | 0 refills | Status: DC
  Filled 2022-05-26: qty 1, 1d supply, fill #0

## 2022-05-26 NOTE — Patient Instructions (Signed)
You are healing well from surgery.  Please call on Monday to let us know how you are feeling. If vulvar/vaginal irritation has improved, we will get you scheduled for a visit next week for a pelvic exam to may sure internal incision is healing well.

## 2022-05-26 NOTE — Progress Notes (Signed)
Gynecologic Oncology Return Clinic Visit  05/26/22  Reason for Visit: follow-up after surgery, treatment planning  Treatment History: Oncology History Overview Note  HER2 negative, Neg Genetics   Uterine cancer ([place])  12/03/2021 Initial Diagnosis   Patient reports several month history of intermittent pelvic pain that she describes as discomfort or sometimes cramping, that has worsened with time.  She notes that this is tolerable.  She began having postmenopausal bleeding started on 6/17 with a few spots of blood.     12/29/2021 Imaging   1. Bilateral large multiloculated cystic lesions of the ovaries, concerning for ovarian malignancy. Recommend gynecologic consultation and pelvic ultrasound and/or pelvic MRI with contrast for further evaluation. 2. Large heterogeneous mass of the posterior pelvis likely arising from the lower uterus, possibly a fibroid, although malignancy is also a concern. Recommend attention on pelvic ultrasound or MRI as above. 3. Additional mass arising from the right uterine fundus which correlates with fibroid described on prior 2015 ultrasound. 4. Nonspecific small ground-glass nodule of the right lower lobe measuring 6 mm. Recommend follow-up chest CT in 6 months for further evaluation. 5. Aortic Atherosclerosis (ICD10-I70.0).   12/30/2021 Pathology Results   A. CERVIX, BIOPSY:  -  Scantly cellular specimen with predominantly blood, fibrin and acute inflammatory infiltrate but with scattered highly atypical cells highly suspicious for malignancy.   Note: A p16 shows strong positivity in these scattered cells; however, while p53 is mildly increased definitive strong overexpression or under expression is not identified.  These findings support the suspicion for malignancy; however, are insufficient for a definitive diagnosis.     01/03/2022 Imaging   MRI pelvis  1. Large mixed solid and cystic lesions of the bilateral ovaries, measuring 8.4 x 7.0 x 5.4 cm on the  right and 10.3 x 8.7 x 8.1 cm on the left, highly concerning for primary ovarian malignancy and contralateral ovarian metastasis.   2. Homogeneously enhancing mass arising from the right aspect of the uterine body and fundus measuring 6.0 x 5.5 x 4.3 cm, consistent with a uterine fibroid and similar to prior ultrasound dated 2015.   3. An additional, much more heterogeneous mass which appears to arise from the cervix or posterior lower uterine segment measuring 8.1 x 6.4 x 6.2 cm. This may reflect an additional fibroid with internal degeneration, however this was not clearly visualized on ultrasound dated 2015 and is highly suspicious for a cervical mass as significant enlargement of benign fibroids is not expected following menopause. This mass appears to closely abut and compress or perhaps even directly involve the adjacent rectum.   4.  No evidence of lymphadenopathy in the pelvis.   01/12/2022 PET scan   1. Hypermetabolic heterogeneous mass centered on the cervix/lower uterine segment is most consistent with primary gynecologic neoplasm. 2. Multiloculated complex bilateral adnexal cystic lesions with hypermetabolic soft tissue component likely reflect metastatic lesions. 3. No hypermetabolic abdominopelvic adenopathy. 4. No evidence of hypermetabolic metastatic disease in the chest or abdomen.   01/18/2022 Pathology Results   FINAL MICROSCOPIC DIAGNOSIS:   A. CERVICAL MASS, BIOPSIES:  - Poorly differentiated carcinoma with necrosis, consistent with high-grade serous carcinoma, see comment   B. CERVICAL MASS, EXCISION:  - Poorly differentiated carcinoma with necrosis, consistent with high-grade serous carcinoma see comment  - Edges of resected tissue fragments are positive for carcinoma   COMMENT:   A and B.   The tumor cells are positive for PAX8, ER and p16 immunostains.  Immunostain for p53 shows significant overexpression. Immunostain  for CK5/6 shows weak focal labeling while  immunostain for  p63 is negative.  The immunoprofile is consistent with above interpretation.  The carcinoma is likely endometrial or ovarian origin with secondary involvement of the cervix.     01/23/2022 Initial Diagnosis   Uterine cancer ([place])   01/23/2022 Cancer Staging   Staging form: Corpus Uteri - Carcinoma and Carcinosarcoma, AJCC 8th Edition - Clinical stage from 01/23/2022: FIGO Stage IIIA (cT3a, cN0, cM0) - Signed by [name], MD on 01/23/2022 Stage prefix: Initial diagnosis   01/31/2022 - 01/31/2022 Chemotherapy   Patient is on Treatment Plan : UTERINE ENDOMETRIAL Dostarlimab-gxly (500 mg) + Carboplatin (AUC 5) + Paclitaxel (175 mg/m2) q21d x 6 cycles / Dostarlimab-gxly (1000 mg) q42d x 6 cycles      01/31/2022 -  Chemotherapy   Patient is on Treatment Plan : UTERINE ENDOMETRIAL Dostarlimab-gxly (500 mg) + Carboplatin (AUC 5) + Paclitaxel (175 mg/m2) q21d x 6 cycles / Dostarlimab-gxly (1000 mg) q42d x 6 cycles      04/14/2022 PET scan   1. Significant interval positive response to therapy. Mildly hypermetabolic solid 5.5 x 2.8 cm posterior uterine cervix mass and mildly hypermetabolic bilateral adnexal mixed cystic and solid masses are all significantly decreased in size and metabolism. 2. No new or progressive hypermetabolic metastatic disease. 3. Low level FDG uptake associated with healing subacute sclerotic nondisplaced anterior left third, fourth and fifth rib fractures without associated discrete osseous lesions, correlate for history of interval injury in this location. 4. Horseshoe kidney.   05/03/2022 Surgery   Robotic-assisted laparoscopic total hysterectomy with bilateral salpingo-oophorectomy, tumor debulking including infracolic omentectomy, mini-lap, cystoscopy, and vaginal laceration repair   Findings:  On speculum exam, normal-appearing cervix that is flush with the vaginal apex.  No visible cervical lesion.  On EUA, moderately mobile, enlarged uterus with mobile  fullness appreciated in the cul-de-sac.  On intra-abdominal entry, normal upper abdominal survey.  Some adhesions of the omentum to the anterior abdominal wall below the umbilicus were noted.  Normal-appearing omentum, small and large bowel.  No ascites.  Right ovary minimally enlarged but overall normal in appearance.  Left ovary replaced by a 6 cm cystic mass, smooth.  Normal-appearing bilateral fallopian tubes.  Bladder was somewhat adherent anteriorly to the cervix.  Uterus approximately 8 cm and deviated to the left given a 10 cm anterior and right-sided fibroid.  The cervix itself did not appear enlarged, either on intra-abdominal phonation or speculum exam.  Posteriorly, a white ridge of somewhat indurated tissue was noted underneath the cervix and just above the rectum.  On palpation, this ridge could be appreciated only on rectovaginal exam.  It was separate and free from the cervix itself.  It was difficult to distinguish whether this was treated tumor or active tumor although its detached location from the cervix and uterus was noted.  Given my conversation with the patient prior to surgery and that she would not want an ostomy (even temporarily), there was no way to resect this area out without significant risk of damage to the rectum.  Given its location, even in the setting of a bowel prep (which she had not had), she would likely require at least a protective diverting ostomy. Cystoscopy, bladder dome noted to be intact and good efflux seen from bilateral ureteral orifices.   05/03/2022 Pathology Results   A. UTERUS, CERVIX, BILATERAL TUBES AND OVARIES, RESECTION:  - Cervix      Nabothian cysts      Negative for  malignancy  - Endometrium      Inactive endometrium      Two benign endometrial polyps      Negative for malignancy  - Myometrium      Leiomyoma      Negative for malignancy  - Right ovary      Hemorrhagic cyst with fibrosis, hemosiderin and focal calcification      Negative  for malignancy  - Left ovary      Hemorrhagic cyst with fibrosis, hemosiderin and focal calcification      Negative for malignancy  - Bilateral fallopian tubes      Unremarkable      Negative for malignancy  - See oncology table   B. OMENTUM, EXCISION:  - Benign omental adipose tissue.  - Negative for malignancy.     Genetic Testing   Negative genetic testing. No pathogenic variants identified on the [facility] CancerNext-Expanded+RNA panel. VUS in [place] called [id number] identified. The report date is 05/03/2022.  The CancerNext-Expanded + RNAinsight gene panel offered by [employer] and includes sequencing and rearrangement analysis for the following 77 genes: IP, ALK, APC*, ATM*, AXIN2, BAP1, BARD1, BLM, BMPR1A, BRCA1*, BRCA2*, BRIP1*, CDC73, CDH1*,CDK4, CDKN1B, CDKN2A, CHEK2*, CTNNA1, DICER1, FANCC, FH, FLCN, GALNT12, KIF1B, LZTR1, MAX, MEN1, MET, MLH1*, MSH2*, MSH3, MSH6*, MUTYH*, NBN, NF1*, NF2, NTHL1, PALB2*, PHOX2B, PMS2*, POT1, PRKAR1A, PTCH1, PTEN*, RAD51C*, RAD51D*,RB1, RECQL, RET, SDHA, SDHAF2, SDHB, SDHC, SDHD, SMAD4, SMARCA4, SMARCB1, SMARCE1, STK11, SUFU, TMEM127, TP53*,TSC1, TSC2, VHL and XRCC2 (sequencing and deletion/duplication); EGFR, EGLN1, HOXB13, KIT, MITF, PDGFRA, POLD1 and POLE (sequencing only); EPCAM and GREM1 (deletion/duplication only).     Interval History: Overall doing well.  Denies significant abdominal or pelvic pain.  Reports incisions healing well.  Notes multiple days now of vulvar itching and irritation, feels very tender.  Denies vaginal bleeding but occasionally has had some pink discharge when she wipes.  Endorses a good appetite without nausea or emesis.  Endorses normal bowel and bladder function.  Past Medical/Surgical History: Past Medical History:  Diagnosis Date   Anxiety    Depression    GERD (gastroesophageal reflux disease)    Hypothyroidism    not on medications currently    Past Surgical History:  Procedure Laterality Date   CESAREAN  SECTION     CYSTOSCOPY N/A 05/03/2022   Procedure: CYSTOSCOPY;  Surgeon: [name], MD;  Location: [facility];  Service: Gynecology;  Laterality: N/A;   DILATION AND CURETTAGE OF UTERUS     61yo for SAB   IR IMAGING GUIDED PORT INSERTION  01/25/2022   LESION REMOVAL N/A 01/18/2022   Procedure: CERVICAL BIOPSIES;  Surgeon: [name], MD;  Location: [facility];  Service: Gynecology;  Laterality: N/A;    Family History  Problem Relation Age of Onset   Thyroid disease Mother    Breast cancer Mother        dx late 27s   Cancer Other        unk type, d. 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  Tobacco Use   Smoking status: Never   Smokeless tobacco: Never  Vaping Use   Vaping Use: Never used  Substance and Sexual Activity   Alcohol use: Never   Drug use: Never   Sexual activity: Not Currently  Other Topics Concern   Not on file  Social History Narrative   Not on file   Social Determinants of Health   Financial Resource Strain: Not on file  Food Insecurity: No Food Insecurity (05/03/2022)   Hunger Vital Sign    Worried About Running Out of Food in the Last Year: Never true    Ran Out of Food in the Last Year: Never true  Transportation Needs: No Transportation Needs (05/03/2022)   PRAPARE - Hydrologist (Medical): No    Lack of Transportation (Non-Medical): No  Physical Activity: Not on file  Stress: Not on file  Social Connections: Not on file    Current Medications:  Current Outpatient Medications:    Cholecalciferol (VITAMIN D3 PO), Take 1 tablet by  mouth daily., Disp: , Rfl:    fluconazole (DIFLUCAN) 150 MG tablet, Take 1 tablet (150 mg total) by mouth daily., Disp: 1 tablet, Rfl: 0   Multiple Vitamin (MULTIVITAMIN WITH MINERALS) TABS tablet, Take 1 tablet by mouth daily., Disp: , Rfl:   Review of Systems: + Itching, rash Denies appetite changes, fevers, chills, fatigue, unexplained weight changes. Denies hearing loss, neck lumps or masses, mouth sores, ringing in ears or voice changes. Denies cough or wheezing.  Denies shortness of breath. Denies chest pain or palpitations. Denies leg swelling. Denies abdominal distention, pain, blood in stools, constipation, diarrhea, nausea, vomiting, or early satiety. Denies pain with intercourse, dysuria, frequency, hematuria or incontinence. Denies hot flashes, pelvic pain, vaginal bleeding or vaginal discharge.   Denies joint pain, back pain or muscle pain/cramps. Denies itching, rash, or wounds. Denies dizziness, headaches, numbness or seizures. Denies swollen lymph nodes or glands, denies easy bruising or bleeding. Denies anxiety, depression, confusion, or decreased concentration.  Physical Exam: BP (!) 162/84 (BP Location: Left Arm, Patient Position: Sitting) Comment: informed Dr Berline Lopes  Pulse 93   Temp 98.4 F (36.9 C) (Oral)   Resp 16   Wt 134 lb (60.8 kg)   SpO2 100%   BMI 23.74 kg/m  General: Alert, oriented, no acute distress. HEENT: Normocephalic, atraumatic, sclera anicteric. Chest: Unlabored breathing on room air. Abdomen: Abdomen is soft, nontender.  Normoactive bowel sounds.  No masses or hepatosplenomegaly appreciated.  Well-healed incisions.  Remaining Dermabond removed and several mattress sutures excised. Extremities: Grossly normal range of motion.  Warm, well perfused.  No edema bilaterally. Skin: Mild erythema under bilateral breasts.   GU: Deferred today.  Laboratory & Radiologic Studies: None new  Assessment & Plan: Tiffany [name] is a 61 y.o. 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  Patient is overall doing well from a postoperative standpoint.  Vaginal exam deferred today given vulvar irritation and itching.  I suspect the patient has a yeast infection.  I am sending a dose of Diflucan into the pharmacy.  I have asked her to call Monday to let me know how she is feeling.  If symptoms are better, we will get her scheduled to come in for a pelvic exam next week.  The patient and I spent approximately 30 minutes discussing findings again from the time of surgery as well as pathology.  Patient was given a copy of her  pathology report.  Patient is scheduled for a postoperative MRI next week.  I reviewed findings of a ridge of tissue directly over the rectum that did not communicate with the cervix, appeared fibrotic.  Would not have been possible to resect this without significant risk of damaging the rectum, resulting in the need for at least a temporary ostomy.  Given our conversations before surgery, I did not pursue attempt at biopsy or excision of this area intra-abdominally given our discussion about her wish to avoid ostomy. I am hoping the MRI will better elucidate this ridge of tissue.  On exam at the time of surgery, there was some thickness there although not discretely nodular.  We discussed various possibilities including that this may be fibrotic tissue, treated tumor, or active tumor.  Plan will be to repeat imaging, likely with both an MRI and a PET scan when she completes adjuvant chemotherapy.  If concern for the possibility of persistent tumor in this area, discussed with the patient attempting a transvaginal biopsy under anesthesia.  It is unclear to me if the lower uterus and cervix were initially involved.  By imaging, it appears that they were, but given the size of this mass, I think it is possible that the entire mass arose from the rectovaginal septum.  There was no communication between the cervix and the ridge of tissue  directly over the rectum at the time of surgery.  We also discussed the possibility that she had such an excellent response to treatment that there is no residual tumor.  Specifically, we discussed this in relation to the ovaries, which were avid on PET scan initially.  While still avid on her most recent PET, avidity had decreased, which would typically support response to therapy.  On final pathology from surgery, ovaries were negative for any evidence of malignancy.  Patient appears to have had a reaction to ChloraPrep.  This was added to her allergy list.  38 minutes of total time was spent for this patient encounter, including preparation, face-to-face counseling with the patient and coordination of care, and documentation of the encounter.  Jeral Pinch, MD  Division of Gynecologic Oncology  Department of Obstetrics and Gynecology  [facility]

## 2022-05-29 ENCOUNTER — Telehealth: Payer: Self-pay

## 2022-05-29 ENCOUNTER — Other Ambulatory Visit (HOSPITAL_COMMUNITY): Payer: Self-pay

## 2022-05-29 ENCOUNTER — Other Ambulatory Visit: Payer: Self-pay | Admitting: Hematology and Oncology

## 2022-05-29 MED ORDER — DEXAMETHASONE 4 MG PO TABS
ORAL_TABLET | ORAL | 6 refills | Status: DC
  Filled 2022-05-29: qty 4, 21d supply, fill #0
  Filled 2022-06-15: qty 4, 21d supply, fill #1

## 2022-05-29 NOTE — Telephone Encounter (Signed)
Left message for Pt stating that decadron rx has been refilled and to call if she needs antiemetic refills.

## 2022-05-29 NOTE — Telephone Encounter (Signed)
Left voicemail with below message again.

## 2022-05-30 ENCOUNTER — Other Ambulatory Visit: Payer: Self-pay | Admitting: Gynecologic Oncology

## 2022-05-30 ENCOUNTER — Other Ambulatory Visit (HOSPITAL_COMMUNITY): Payer: Self-pay

## 2022-05-30 ENCOUNTER — Telehealth: Payer: Self-pay | Admitting: Oncology

## 2022-05-30 ENCOUNTER — Telehealth: Payer: Self-pay

## 2022-05-30 DIAGNOSIS — L292 Pruritus vulvae: Secondary | ICD-10-CM

## 2022-05-30 MED ORDER — MICONAZOLE NITRATE 2 % EX CREA
1.0000 | TOPICAL_CREAM | Freq: Two times a day (BID) | CUTANEOUS | 0 refills | Status: DC
  Filled 2022-05-30: qty 28.35, 14d supply, fill #0

## 2022-05-30 NOTE — Telephone Encounter (Signed)
Pt aware, via voicemail, of medication being sent to pharmacy

## 2022-05-30 NOTE — Telephone Encounter (Signed)
Called Tiffany [name] and advised that the treatment plan always says 9 cycles but that her plan may be different.  Discussed that she will see Dr. Alvy Bimler on Friday and she will let her know how many more cycles of chemo that she is recommending.  Tiffany [name] verbalized understanding and agreement.

## 2022-05-30 NOTE — Telephone Encounter (Signed)
Pt called with an update on vulvar irritation.  She states irritation is feeling better from the Diflucan that was prescribed on Friday 12/8. She did mention she is still having a little itching but wants to know if there is a type of lotion/cream, OTC or prescription, she can use in that area. She states she is still not ready for an exam just yet.  Message sent to Dr. Berline Lopes and Joylene John NP.

## 2022-05-31 ENCOUNTER — Other Ambulatory Visit (HOSPITAL_COMMUNITY): Payer: Self-pay

## 2022-05-31 ENCOUNTER — Ambulatory Visit (HOSPITAL_COMMUNITY)
Admission: RE | Admit: 2022-05-31 | Discharge: 2022-05-31 | Disposition: A | Discharge status: Home / Self Care | Payer: No Typology Code available for payment source | Source: Ambulatory Visit | Attending: Gynecologic Oncology | Admitting: Gynecologic Oncology

## 2022-05-31 DIAGNOSIS — C579 Malignant neoplasm of female genital organ, unspecified: Secondary | ICD-10-CM | POA: Diagnosis not present

## 2022-05-31 MED ORDER — GADOBUTROL 1 MMOL/ML IV SOLN
6.0000 mL | Freq: Once | INTRAVENOUS | Status: AC | PRN
  Administered 2022-05-31: 6 mL via INTRAVENOUS

## 2022-06-01 ENCOUNTER — Other Ambulatory Visit (HOSPITAL_COMMUNITY): Payer: Self-pay

## 2022-06-01 ENCOUNTER — Telehealth: Payer: Self-pay | Admitting: Gynecologic Oncology

## 2022-06-01 ENCOUNTER — Telehealth: Payer: Self-pay

## 2022-06-01 ENCOUNTER — Other Ambulatory Visit: Payer: Self-pay | Admitting: Gynecologic Oncology

## 2022-06-01 DIAGNOSIS — L292 Pruritus vulvae: Secondary | ICD-10-CM

## 2022-06-01 MED ORDER — MICONAZOLE NITRATE 2 % EX CREA: 1.0000 | TOPICAL_CREAM | Freq: Two times a day (BID) | CUTANEOUS | 0 refills | Status: DC

## 2022-06-01 MED ORDER — MICONAZOLE NITRATE 2 % EX CREA
1.0000 | TOPICAL_CREAM | Freq: Two times a day (BID) | CUTANEOUS | 0 refills | Status: DC
  Filled 2022-06-01: qty 30, 15d supply, fill #0

## 2022-06-01 MED FILL — Dexamethasone Sodium Phosphate Inj 100 MG/10ML: INTRAMUSCULAR | Qty: 1 | Status: AC

## 2022-06-01 MED FILL — Fosaprepitant Dimeglumine For IV Infusion 150 MG (Base Eq): INTRAVENOUS | Qty: 5 | Status: AC

## 2022-06-01 NOTE — Telephone Encounter (Signed)
Pt called stating she needs to be seen. She is in pain, especially at night. Pinkish tinge discharge only when wipes. Burning on outside only, no swelling, no odor, no fever. She feels her stomach is full/pressure.She thinks if the area was numbed she could tolerate and exam. She states she was unable to pick up cream that was sent in on 12/12 d/t pharmacy does not have it, she has been 3 times. She wants it called in to [facility]. (Updated in Chart)  Pt aware Dr. Berline Lopes is in the OR today and I will call with any advise.

## 2022-06-01 NOTE — Telephone Encounter (Signed)
Called patient - discussed MRI findings. Area along left pelvic sidewall concerning for necrotic node - was not there on prior imaging. Could be postoperative change although lymph node excision not performed. Will plan to follow on upcoming imaging.   She will pick up yeast cream tomorrow. Tentative plan for exam in clinic next week - may need oral narcotic or ativan prior given discomfort.  Jeral Pinch MD Gynecologic Oncology

## 2022-06-01 NOTE — Telephone Encounter (Signed)
Started in error

## 2022-06-02 ENCOUNTER — Encounter: Payer: Self-pay | Admitting: Gynecologic Oncology

## 2022-06-02 ENCOUNTER — Encounter: Payer: Self-pay | Admitting: Oncology

## 2022-06-02 ENCOUNTER — Encounter: Payer: Self-pay | Admitting: Hematology and Oncology

## 2022-06-02 ENCOUNTER — Inpatient Hospital Stay: Payer: No Typology Code available for payment source

## 2022-06-02 ENCOUNTER — Inpatient Hospital Stay (HOSPITAL_BASED_OUTPATIENT_CLINIC_OR_DEPARTMENT_OTHER): Payer: No Typology Code available for payment source | Admitting: Hematology and Oncology

## 2022-06-02 ENCOUNTER — Other Ambulatory Visit: Payer: Self-pay | Admitting: Gynecologic Oncology

## 2022-06-02 ENCOUNTER — Telehealth: Payer: Self-pay | Admitting: *Deleted

## 2022-06-02 ENCOUNTER — Inpatient Hospital Stay: Payer: No Typology Code available for payment source | Admitting: Licensed Clinical Social Worker

## 2022-06-02 VITALS — BP 150/94 | HR 111 | Temp 97.4°F | Resp 18 | Ht 63.0 in | Wt 131.1 lb

## 2022-06-02 VITALS — HR 110

## 2022-06-02 DIAGNOSIS — E039 Hypothyroidism, unspecified: Secondary | ICD-10-CM | POA: Diagnosis not present

## 2022-06-02 DIAGNOSIS — C55 Malignant neoplasm of uterus, part unspecified: Secondary | ICD-10-CM | POA: Diagnosis present

## 2022-06-02 DIAGNOSIS — Z5111 Encounter for antineoplastic chemotherapy: Secondary | ICD-10-CM | POA: Diagnosis present

## 2022-06-02 DIAGNOSIS — T451X5A Adverse effect of antineoplastic and immunosuppressive drugs, initial encounter: Secondary | ICD-10-CM

## 2022-06-02 DIAGNOSIS — G620 Drug-induced polyneuropathy: Secondary | ICD-10-CM | POA: Diagnosis not present

## 2022-06-02 DIAGNOSIS — Z90710 Acquired absence of both cervix and uterus: Secondary | ICD-10-CM | POA: Diagnosis not present

## 2022-06-02 DIAGNOSIS — Z9079 Acquired absence of other genital organ(s): Secondary | ICD-10-CM | POA: Diagnosis not present

## 2022-06-02 DIAGNOSIS — C539 Malignant neoplasm of cervix uteri, unspecified: Secondary | ICD-10-CM

## 2022-06-02 DIAGNOSIS — Z90722 Acquired absence of ovaries, bilateral: Secondary | ICD-10-CM | POA: Diagnosis not present

## 2022-06-02 DIAGNOSIS — Z79899 Other long term (current) drug therapy: Secondary | ICD-10-CM | POA: Diagnosis not present

## 2022-06-02 DIAGNOSIS — Z803 Family history of malignant neoplasm of breast: Secondary | ICD-10-CM | POA: Diagnosis not present

## 2022-06-02 DIAGNOSIS — R102 Pelvic and perineal pain: Secondary | ICD-10-CM

## 2022-06-02 DIAGNOSIS — Z5112 Encounter for antineoplastic immunotherapy: Secondary | ICD-10-CM | POA: Diagnosis present

## 2022-06-02 LAB — CBC WITH DIFFERENTIAL (CANCER CENTER ONLY)
Abs Immature Granulocytes: 0.02 10*3/uL (ref 0.00–0.07)
Basophils Absolute: 0 10*3/uL (ref 0.0–0.1)
Basophils Relative: 0 %
Eosinophils Absolute: 0 10*3/uL (ref 0.0–0.5)
Eosinophils Relative: 0 %
HCT: 38.2 % (ref 36.0–46.0)
Hemoglobin: 12.6 g/dL (ref 12.0–15.0)
Immature Granulocytes: 0 %
Lymphocytes Relative: 17 %
Lymphs Abs: 1.3 10*3/uL (ref 0.7–4.0)
MCH: 24.5 pg — ABNORMAL LOW (ref 26.0–34.0)
MCHC: 33 g/dL (ref 30.0–36.0)
MCV: 74.2 fL — ABNORMAL LOW (ref 80.0–100.0)
Monocytes Absolute: 0.1 10*3/uL (ref 0.1–1.0)
Monocytes Relative: 1 %
Neutro Abs: 6.4 10*3/uL (ref 1.7–7.7)
Neutrophils Relative %: 82 %
Platelet Count: 199 10*3/uL (ref 150–400)
RBC: 5.15 MIL/uL — ABNORMAL HIGH (ref 3.87–5.11)
RDW: 13.9 % (ref 11.5–15.5)
WBC Count: 7.9 10*3/uL (ref 4.0–10.5)
nRBC: 0 % (ref 0.0–0.2)

## 2022-06-02 LAB — CMP (CANCER CENTER ONLY)
ALT: 20 U/L (ref 0–44)
AST: 17 U/L (ref 15–41)
Albumin: 4.7 g/dL (ref 3.5–5.0)
Alkaline Phosphatase: 77 U/L (ref 38–126)
Anion gap: 10 (ref 5–15)
BUN: 16 mg/dL (ref 6–20)
CO2: 23 mmol/L (ref 22–32)
Calcium: 10.2 mg/dL (ref 8.9–10.3)
Chloride: 105 mmol/L (ref 98–111)
Creatinine: 0.65 mg/dL (ref 0.44–1.00)
GFR, Estimated: >60 mL/min (ref >60)
Glucose, Bld: 176 mg/dL — ABNORMAL HIGH (ref 70–99)
Potassium: 3.8 mmol/L (ref 3.5–5.1)
Sodium: 138 mmol/L (ref 135–145)
Total Bilirubin: 0.3 mg/dL (ref 0.3–1.2)
Total Protein: 7.5 g/dL (ref 6.5–8.1)

## 2022-06-02 MED ORDER — SODIUM CHLORIDE 0.9 % IV SOLN
150.0000 mg | Freq: Once | INTRAVENOUS | Status: AC
  Administered 2022-06-02: 150 mg via INTRAVENOUS
  Filled 2022-06-02: qty 150

## 2022-06-02 MED ORDER — SODIUM CHLORIDE 0.9% FLUSH
10.0000 mL | Freq: Once | INTRAVENOUS | Status: AC
  Administered 2022-06-02: 10 mL

## 2022-06-02 MED ORDER — SODIUM CHLORIDE 0.9 % IV SOLN
500.0000 mg | Freq: Once | INTRAVENOUS | Status: AC
  Administered 2022-06-02: 500 mg via INTRAVENOUS
  Filled 2022-06-02: qty 10

## 2022-06-02 MED ORDER — FAMOTIDINE IN NACL 20-0.9 MG/50ML-% IV SOLN
20.0000 mg | Freq: Once | INTRAVENOUS | Status: AC
  Administered 2022-06-02: 20 mg via INTRAVENOUS
  Filled 2022-06-02: qty 50

## 2022-06-02 MED ORDER — DIAZEPAM 5 MG PO TABS: 5.0000 mg | ORAL_TABLET | Freq: Once | ORAL | 0 refills | Status: AC

## 2022-06-02 MED ORDER — HEPARIN SOD (PORK) LOCK FLUSH 100 UNIT/ML IV SOLN
500.0000 [IU] | Freq: Once | INTRAVENOUS | Status: AC | PRN
  Administered 2022-06-02: 500 [IU]

## 2022-06-02 MED ORDER — SODIUM CHLORIDE 0.9 % IV SOLN
469.0000 mg | Freq: Once | INTRAVENOUS | Status: AC
  Administered 2022-06-02: 470 mg via INTRAVENOUS
  Filled 2022-06-02: qty 47

## 2022-06-02 MED ORDER — PALONOSETRON HCL INJECTION 0.25 MG/5ML
0.2500 mg | Freq: Once | INTRAVENOUS | Status: AC
  Administered 2022-06-02: 0.25 mg via INTRAVENOUS
  Filled 2022-06-02: qty 5

## 2022-06-02 MED ORDER — SODIUM CHLORIDE 0.9 % IV SOLN: Freq: Once | INTRAVENOUS | Status: AC

## 2022-06-02 MED ORDER — SODIUM CHLORIDE 0.9% FLUSH
10.0000 mL | INTRAVENOUS | Status: DC | PRN
  Administered 2022-06-02: 10 mL

## 2022-06-02 MED ORDER — SODIUM CHLORIDE 0.9 % IV SOLN
10.0000 mg | Freq: Once | INTRAVENOUS | Status: AC
  Administered 2022-06-02: 10 mg via INTRAVENOUS
  Filled 2022-06-02: qty 10

## 2022-06-02 MED ORDER — SODIUM CHLORIDE 0.9 % IV SOLN
175.0000 mg/m2 | Freq: Once | INTRAVENOUS | Status: AC
  Administered 2022-06-02: 282 mg via INTRAVENOUS
  Filled 2022-06-02: qty 47

## 2022-06-02 MED ORDER — CETIRIZINE HCL 10 MG/ML IV SOLN
10.0000 mg | Freq: Once | INTRAVENOUS | Status: AC
  Administered 2022-06-02: 10 mg via INTRAVENOUS
  Filled 2022-06-02: qty 1

## 2022-06-02 NOTE — Progress Notes (Signed)
Faxed referral for second opinion to [facility] at [phone].

## 2022-06-02 NOTE — Telephone Encounter (Signed)
The [place] paperwork completed by this nurse at this time.  Folder placed in designated mail bin for collaborative pick up for provider review, signature, and return.

## 2022-06-02 NOTE — Progress Notes (Signed)
Tiffany [name] Progress Note  Holiday representative met with patient in infusion to provide ongoing support. Pt is glad to have had her surgery although still feels she is living with uncertainty in terms of origin of cancer. Normalized difficulty of feeling uncertain and how to continue to live life despite that feeling. Pt continues to have good support from her partner, who was present today.  Pt did receive assistance from [employer] [facility] and will send [name] a message from that agency with potential other assistance to look into. Pt has concerns as her deductible will reset in January and she still has treatment and scans.  [name] will plan to see pt at next infusion appt.    Tiffany Negron E Ronnita Paz, LCSW

## 2022-06-02 NOTE — Progress Notes (Signed)
Per Dr. Gorsuch, okay to treat with elevated heart rate 

## 2022-06-02 NOTE — Patient Instructions (Signed)

## 2022-06-02 NOTE — Progress Notes (Signed)
Met with Tiffany [name] and scheduled an appointment with Dr. Berline Lopes on 06/09/22 at 4:00 for an exam to evaluate her pelvic pain pressure. Also discussed we will send in a medication for her to relax that she can take before she comes in for the appointment.  Advised I will call and discuss more with her.  She verbalized understanding and agreement.

## 2022-06-02 NOTE — Assessment & Plan Note (Signed)
she has mild peripheral neuropathy, [name], [name], I will consider modifying the dose of the treatment  

## 2022-06-02 NOTE — Assessment & Plan Note (Signed)
I have reviewed her surgical report and pathology report We also reviewed recent MRI imaging The patient has difficulties understanding the origin of her disease Prior to neoadjuvant chemotherapy, the patient have abnormal pelvic pain, discharge, grossly abnormal PET/CT imaging and CT scan It is possible that she might have complete response to neoadjuvant chemotherapy At the end of the day, it is not possible for me to give her a satisfactory answer Even if she has cancer of unknown primary, the usual systemic chemotherapy that is prescribed is carboplatin and paclitaxel It is not clear to me the abnormality seen on her MRI report I do not recommend surgical evaluation or repeat surgery to look for the origin of the cancer For now, I recommend we proceed with 2 more cycles of chemotherapy and then repeat imaging study She is in agreement Ultimately, the patient also requested a second opinion at [facility] and we will arrange for that

## 2022-06-02 NOTE — Progress Notes (Signed)
[place] OFFICE PROGRESS NOTE  Patient Care Team: Lawerance Cruel, MD as PCP - General (Family Medicine)  ASSESSMENT & PLAN:  Uterine cancer [facility]) I have reviewed her surgical report and pathology report We also reviewed recent MRI imaging The patient has difficulties understanding the origin of her disease Prior to neoadjuvant chemotherapy, the patient have abnormal pelvic pain, discharge, grossly abnormal PET/CT imaging and CT scan It is possible that she might have complete response to neoadjuvant chemotherapy At the end of the day, it is not possible for me to give her a satisfactory answer Even if she has cancer of unknown primary, the usual systemic chemotherapy that is prescribed is carboplatin and paclitaxel It is not clear to me the abnormality seen on her MRI report I do not recommend surgical evaluation or repeat surgery to look for the origin of the cancer For now, I recommend we proceed with 2 more cycles of chemotherapy and then repeat imaging study She is in agreement Ultimately, the patient also requested a second opinion at [facility] and we will arrange for that  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, [name], I will consider modifying the dose of the treatment   No orders of the defined types were placed in this encounter.   All questions were answered. The patient knows to call the clinic with any problems, [name], discussions about plan of care and coordination of care plan   [name], MD 06/02/2022 11:18 AM  INTERVAL HISTORY: Please see below for problem oriented charting. she returns for treatment follow-up and to  resume chemotherapy after surgery The patient felt she has some pelvic pressure/discomfort The patient needed to understand the origin of her cancer and I spent a lot of time reviewing surgical report and pathology report as well as recent MRI findings with her Her discomfort in the pelvis region is not enough for her to warrant additional pain medications although I did offer her pain management  REVIEW OF SYSTEMS:   Constitutional: Denies fevers, [name], nose, mouth, throat, [name], [name], chest discomfort or lower extremity swelling Gastrointestinal:  Denies nausea, heartburn or change in bowel habits Skin: Denies abnormal skin rashes Lymphatics: Denies new lymphadenopathy or easy bruising Neurological:Denies numbness, tingling or new weaknesses Behavioral/Psych: Mood is stable, no new changes  All other systems were reviewed with the patient and are negative.  I have reviewed the past medical history, past surgical history, social history and family history with the patient and they are unchanged from previous note.  ALLERGIES:  is allergic to seasonal ic [octacosanol], chlorhexidine gluconate, and latex.  MEDICATIONS:  Current Outpatient Medications  Medication Sig Dispense Refill   dexamethasone (DECADRON) 4 MG tablet Take 2 tablets the night before and 2 tablets the morning of chemotherapy, every 3 weeks, by mouth for 6 cycles 36 tablet 6   Cholecalciferol (VITAMIN D3 PO) Take 1 tablet by mouth daily.     miconazole (MICATIN) 2 % cream Apply to the vulva twice daily as needed for itching 30 g 0  Multiple Vitamin (MULTIVITAMIN WITH MINERALS) TABS tablet Take 1 tablet by mouth daily.     ondansetron (ZOFRAN) 4 MG tablet Take 4 mg by mouth every 8 (eight) hours as needed for nausea or vomiting.      prochlorperazine (COMPAZINE) 10 MG tablet Take 10 mg by mouth every 6 (six) hours as needed for nausea or vomiting.     No current facility-administered medications for this visit.   Facility-Administered Medications Ordered in Other Visits  Medication Dose Route Frequency Provider Last Rate Last Admin   CARBOplatin (PARAPLATIN) 470 mg in sodium chloride 0.9 % 250 mL chemo infusion  470 mg Intravenous Once Alvy Bimler, Lesly Pontarelli, MD       heparin lock flush 100 unit/mL  500 Units Intracatheter Once PRN Alvy Bimler, Shyanne Mcclary, MD       PACLitaxel (TAXOL) 282 mg in sodium chloride 0.9 % 250 mL chemo infusion (> 37m/m2)  175 mg/m2 (Treatment Plan Recorded) Intravenous Once Oumar Marcott, MD       sodium chloride flush (NS) 0.9 % injection 10 mL  10 mL Intracatheter PRN GHeath Lark MD        SUMMARY OF ONCOLOGIC HISTORY: Oncology History Overview Note  HER2 negative, Neg Genetics   [place]terine cancer ([place]  12/03/2021 Initial Diagnosis   Patient reports several month history of intermittent pelvic pain that she describes as discomfort or sometimes cramping, that has worsened with time.  She notes that this is tolerable.  She began having postmenopausal bleeding started on 6/17 with a few spots of blood.     12/29/2021 Imaging   1. Bilateral large multiloculated cystic lesions of the ovaries, concerning for ovarian malignancy. Recommend gynecologic consultation and pelvic ultrasound and/or pelvic MRI with contrast for further evaluation. 2. Large heterogeneous mass of the posterior pelvis likely arising from the lower uterus, possibly a fibroid, although malignancy is also a concern. Recommend attention on pelvic ultrasound or MRI as above. 3. Additional mass arising from the right uterine fundus which correlates with fibroid described on prior 2015 ultrasound. 4. Nonspecific small ground-glass nodule of the right lower lobe measuring 6 mm. Recommend follow-up chest CT in 6 months for further evaluation. 5. Aortic  Atherosclerosis (ICD10-I70.0).   12/30/2021 Pathology Results   A. CERVIX, BIOPSY:  -  Scantly cellular specimen with predominantly blood, fibrin and acute inflammatory infiltrate but with scattered highly atypical cells highly suspicious for malignancy.   Note: A p16 shows strong positivity in these scattered cells; however, while p53 is mildly increased definitive strong overexpression or under expression is not identified.  These findings support the suspicion for malignancy; however, are insufficient for a definitive diagnosis.     01/03/2022 Imaging   MRI pelvis  1. Large mixed solid and cystic lesions of the bilateral ovaries, measuring 8.4 x 7.0 x 5.4 cm on the right and 10.3 x 8.7 x 8.1 cm on the left, highly concerning for primary ovarian malignancy and contralateral ovarian metastasis.   2. Homogeneously enhancing mass arising from the right aspect of the uterine body and fundus measuring 6.0 x 5.5 x 4.3 cm, consistent with a uterine fibroid and similar to prior ultrasound dated 2015.   3. An additional, much more heterogeneous mass which appears to arise from the cervix or posterior lower uterine segment measuring 8.1 x 6.4 x 6.2 cm. This may reflect an additional fibroid with internal degeneration, however this was not clearly visualized on ultrasound dated 2015 and is highly suspicious for a cervical mass as significant enlargement of  benign fibroids is not expected following menopause. This mass appears to closely abut and compress or perhaps even directly involve the adjacent rectum.   4.  No evidence of lymphadenopathy in the pelvis.   01/12/2022 PET scan   1. Hypermetabolic heterogeneous mass centered on the cervix/lower uterine segment is most consistent with primary gynecologic neoplasm. 2. Multiloculated complex bilateral adnexal cystic lesions with hypermetabolic soft tissue component likely reflect metastatic lesions. 3. No hypermetabolic abdominopelvic adenopathy. 4. No  evidence of hypermetabolic metastatic disease in the chest or abdomen.   01/18/2022 Pathology Results   FINAL MICROSCOPIC DIAGNOSIS:   A. CERVICAL MASS, BIOPSIES:  - Poorly differentiated carcinoma with necrosis, consistent with high-grade serous carcinoma, see comment   B. CERVICAL MASS, EXCISION:  - Poorly differentiated carcinoma with necrosis, consistent with high-grade serous carcinoma see comment  - Edges of resected tissue fragments are positive for carcinoma   COMMENT:   A and B.   The tumor cells are positive for PAX8, ER and p16 immunostains.  Immunostain for p53 shows significant overexpression. Immunostain for CK5/6 shows weak focal labeling while immunostain for  p63 is negative.  The immunoprofile is consistent with above interpretation.  The carcinoma is likely endometrial or ovarian origin with secondary involvement of the cervix.     01/23/2022 Initial Diagnosis   Uterine cancer ([place])   01/23/2022 Cancer Staging   Staging form: Corpus Uteri - Carcinoma and Carcinosarcoma, AJCC 8th Edition - Clinical stage from 01/23/2022: FIGO Stage IIIA (cT3a, cN0, cM0) - Signed by [name], MD on 01/23/2022 Stage prefix: Initial diagnosis   01/31/2022 - 01/31/2022 Chemotherapy   Patient is on Treatment Plan : UTERINE ENDOMETRIAL Dostarlimab-gxly (500 mg) + Carboplatin (AUC 5) + Paclitaxel (175 mg/m2) q21d x 6 cycles / Dostarlimab-gxly (1000 mg) q42d x 6 cycles      01/31/2022 -  Chemotherapy   Patient is on Treatment Plan : UTERINE ENDOMETRIAL Dostarlimab-gxly (500 mg) + Carboplatin (AUC 5) + Paclitaxel (175 mg/m2) q21d x 6 cycles / Dostarlimab-gxly (1000 mg) q42d x 6 cycles      04/14/2022 PET scan   1. Significant interval positive response to therapy. Mildly hypermetabolic solid 5.5 x 2.8 cm posterior uterine cervix mass and mildly hypermetabolic bilateral adnexal mixed cystic and solid masses are all significantly decreased in size and metabolism. 2. No new or progressive hypermetabolic  metastatic disease. 3. Low level FDG uptake associated with healing subacute sclerotic nondisplaced anterior left third, fourth and fifth rib fractures without associated discrete osseous lesions, correlate for history of interval injury in this location. 4. Horseshoe kidney.   05/03/2022 Surgery   Robotic-assisted laparoscopic total hysterectomy with bilateral salpingo-oophorectomy, tumor debulking including infracolic omentectomy, mini-lap, cystoscopy, and vaginal laceration repair   Findings:  On speculum exam, normal-appearing cervix that is flush with the vaginal apex.  No visible cervical lesion.  On EUA, moderately mobile, enlarged uterus with mobile fullness appreciated in the cul-de-sac.  On intra-abdominal entry, normal upper abdominal survey.  Some adhesions of the omentum to the anterior abdominal wall below the umbilicus were noted.  Normal-appearing omentum, small and large bowel.  No ascites.  Right ovary minimally enlarged but overall normal in appearance.  Left ovary replaced by a 6 cm cystic mass, smooth.  Normal-appearing bilateral fallopian tubes.  Bladder was somewhat adherent anteriorly to the cervix.  Uterus approximately 8 cm and deviated to the left given a 10 cm anterior and right-sided fibroid.  The cervix itself did not appear enlarged, either on intra-abdominal  phonation or speculum exam.  Posteriorly, a white ridge of somewhat indurated tissue was noted underneath the cervix and just above the rectum.  On palpation, this ridge could be appreciated only on rectovaginal exam.  It was separate and free from the cervix itself.  It was difficult to distinguish whether this was treated tumor or active tumor although its detached location from the cervix and uterus was noted.  Given my conversation with the patient prior to surgery and that she would not want an ostomy (even temporarily), there was no way to resect this area out without significant risk of damage to the rectum.  Given  its location, even in the setting of a bowel prep (which she had not had), she would likely require at least a protective diverting ostomy. Cystoscopy, bladder dome noted to be intact and good efflux seen from bilateral ureteral orifices.   05/03/2022 Pathology Results   A. UTERUS, CERVIX, BILATERAL TUBES AND OVARIES, RESECTION:  - Cervix      Nabothian cysts      Negative for malignancy  - Endometrium      Inactive endometrium      Two benign endometrial polyps      Negative for malignancy  - Myometrium      Leiomyoma      Negative for malignancy  - Right ovary      Hemorrhagic cyst with fibrosis, hemosiderin and focal calcification      Negative for malignancy  - Left ovary      Hemorrhagic cyst with fibrosis, hemosiderin and focal calcification      Negative for malignancy  - Bilateral fallopian tubes      Unremarkable      Negative for malignancy  - See oncology table   B. OMENTUM, EXCISION:  - Benign omental adipose tissue.  - Negative for malignancy.     Genetic Testing   Negative genetic testing. No pathogenic variants identified on the [facility] CancerNext-Expanded+RNA panel. VUS in [place] called [id number] identified. The report date is 05/03/2022.  The CancerNext-Expanded + RNAinsight gene panel offered by [employer] and includes sequencing and rearrangement analysis for the following 77 genes: IP, ALK, APC*, ATM*, AXIN2, BAP1, BARD1, BLM, BMPR1A, BRCA1*, BRCA2*, BRIP1*, CDC73, CDH1*,CDK4, CDKN1B, CDKN2A, CHEK2*, CTNNA1, DICER1, FANCC, FH, FLCN, GALNT12, KIF1B, LZTR1, MAX, MEN1, MET, MLH1*, MSH2*, MSH3, MSH6*, MUTYH*, NBN, NF1*, NF2, NTHL1, PALB2*, PHOX2B, PMS2*, POT1, PRKAR1A, PTCH1, PTEN*, RAD51C*, RAD51D*,RB1, RECQL, RET, SDHA, SDHAF2, SDHB, SDHC, SDHD, SMAD4, SMARCA4, SMARCB1, SMARCE1, STK11, SUFU, TMEM127, TP53*,TSC1, TSC2, VHL and XRCC2 (sequencing and deletion/duplication); EGFR, EGLN1, HOXB13, KIT, MITF, PDGFRA, POLD1 and POLE (sequencing only); EPCAM and GREM1  (deletion/duplication only).     PHYSICAL EXAMINATION: ECOG PERFORMANCE STATUS: 1 - Symptomatic but completely ambulatory  Vitals:   06/02/22 0900  BP: (!) 150/94  Pulse: (!) 111  Resp: 18  Temp: (!) 97.4 F (36.3 C)  SpO2: 99%   Filed Weights   06/02/22 0900  Weight: 131 lb 1.6 oz (59.5 kg)    GENERAL:alert, no distress and comfortable NEURO: alert & oriented x 3 with fluent speech, no focal motor/sensory deficits  LABORATORY DATA:  I have reviewed the data as listed    Component Value Date/Time   NA 138 06/02/2022 0829   K 3.8 06/02/2022 0829   CL 105 06/02/2022 0829   CO2 23 06/02/2022 0829   GLUCOSE 176 (H) 06/02/2022 0829   BUN 16 06/02/2022 0829   CREATININE 0.65 06/02/2022 0829   CALCIUM 10.2 06/02/2022 0829  PROT 7.5 06/02/2022 0829   ALBUMIN 4.7 06/02/2022 0829   AST 17 06/02/2022 0829   ALT 20 06/02/2022 0829   ALKPHOS 77 06/02/2022 0829   BILITOT 0.3 06/02/2022 0829   GFRNONAA >60 06/02/2022 0829    No results found for: "SPEP", "UPEP"  Lab Results  Component Value Date   WBC 7.9 06/02/2022   NEUTROABS 6.4 06/02/2022   HGB 12.6 06/02/2022   HCT 38.2 06/02/2022   MCV 74.2 (L) 06/02/2022   PLT 199 06/02/2022      Chemistry      Component Value Date/Time   NA 138 06/02/2022 0829   K 3.8 06/02/2022 0829   CL 105 06/02/2022 0829   CO2 23 06/02/2022 0829   BUN 16 06/02/2022 0829   CREATININE 0.65 06/02/2022 0829      Component Value Date/Time   CALCIUM 10.2 06/02/2022 0829   ALKPHOS 77 06/02/2022 0829   AST 17 06/02/2022 0829   ALT 20 06/02/2022 0829   BILITOT 0.3 06/02/2022 0829       RADIOGRAPHIC STUDIES: I have personally reviewed the radiological images as listed and agreed with the findings in the report. MR Pelvis W Wo Contrast  Addendum Date: 06/01/2022   ADDENDUM REPORT: 06/01/2022 12:12 ADDENDUM: Addendum is made to note presence of a small area of T2 hyperintensity and rim enhancement in the left pelvic sidewall  adjacent to the left external iliac vein (series 5, image 14, series 19, image 30). This measures approximately 1.8 x 1.1 cm. By report, there was no pelvic lymph node dissection intraoperatively and there was no evidence of lymphadenopathy or soft tissue metastatic disease in this vicinity on prior staging imaging, and this is of uncertain nature, potentially reflecting a necrotic lymph node developed in the interval to latest preoperative staging imaging dated 04/14/2022 or alternately postoperative hematoma/seroma. Attention on follow-up. These findings were discussed with Dr. Berline Lopes by Dr. Jacalyn Lefevre, 06/01/2022. Electronically Signed   By: Delanna Ahmadi M.D.   On: 06/01/2022 12:12   Result Date: 06/01/2022 CLINICAL DATA:  Serous carcinoma, status post recent hysterectomy, history of prolapsing mass EXAM: MRI PELVIS WITHOUT AND WITH CONTRAST TECHNIQUE: Multiplanar multisequence MR imaging of the pelvis was performed both before and after administration of intravenous contrast. CONTRAST:  35m GADAVIST GADOBUTROL 1 MMOL/ML IV SOLN COMPARISON:  PET-CT, 04/14/2022, MR pelvis, 01/03/2022 FINDINGS: Urinary Tract:  No abnormality visualized. Bowel:  Unremarkable visualized pelvic bowel loops. Vascular/Lymphatic: No pathologically enlarged lymph nodes. No significant vascular abnormality seen. Reproductive: Status post interval hysterectomy and bilateral oophorectomy. Other:  None. Musculoskeletal: No suspicious bone lesions identified. IMPRESSION: Status post interval hysterectomy and bilateral oophorectomy. No evidence of recurrent or metastatic disease in the pelvis. Electronically Signed: By: ADelanna AhmadiM.D. On: 05/31/2022 16:49

## 2022-06-02 NOTE — Patient Instructions (Signed)
[facility]  Discharge Instructions: Thank you for choosing [facility] to provide your oncology and hematology care.   If you have a lab appointment with the [facility], please go directly to the [facility] and check in at the registration area.   Wear comfortable clothing and clothing appropriate for easy access to any Portacath or PICC line.   We strive to give you quality time with your provider. You may need to reschedule your appointment if you arrive late (15 or more minutes).  Arriving late affects you and other patients whose appointments are after yours.  Also, if you miss three or more appointments without notifying the office, you may be dismissed from the clinic at the provider's discretion.      For prescription refill requests, have your pharmacy contact our office and allow 72 hours for refills to be completed.    Today you received the following chemotherapy and/or immunotherapy agents: Jemperli, Paclitaxel, Carboplatin.   To help prevent nausea and vomiting after your treatment, we encourage you to take your nausea medication as directed.  BELOW ARE SYMPTOMS THAT SHOULD BE REPORTED IMMEDIATELY: *FEVER GREATER THAN 100.4 F (38 C) OR HIGHER *CHILLS OR SWEATING *NAUSEA AND VOMITING THAT IS NOT CONTROLLED WITH YOUR NAUSEA MEDICATION *UNUSUAL SHORTNESS OF BREATH *UNUSUAL BRUISING OR BLEEDING *URINARY PROBLEMS (pain or burning when urinating, or frequent urination) *BOWEL PROBLEMS (unusual diarrhea, constipation, pain near the anus) TENDERNESS IN MOUTH AND THROAT WITH OR WITHOUT PRESENCE OF ULCERS (sore throat, sores in mouth, or a toothache) UNUSUAL RASH, SWELLING OR PAIN  UNUSUAL VAGINAL DISCHARGE OR ITCHING   Items with * indicate a potential emergency and should be followed up as soon as possible or go to the Emergency Department if any problems should occur.  Please show the CHEMOTHERAPY ALERT CARD or IMMUNOTHERAPY  ALERT CARD at check-in to the Emergency Department and triage nurse.  Should you have questions after your visit or need to cancel or reschedule your appointment, please contact [facility]  Dept: [phone]  and follow the prompts.  Office hours are 8:00 a.m. to 4:30 p.m. Monday - Friday. Please note that voicemails left after 4:00 p.m. may not be returned until the following business day.  We are closed weekends and major holidays. You have access to a nurse at all times for urgent questions. Please call the main number to the clinic Dept: [phone] and follow the prompts.   For any non-urgent questions, you may also contact your provider using MyChart. We now offer e-Visits for anyone 28 and older to request care online for non-urgent symptoms. For details visit [email or web address].   Also download the MyChart app! Go to the app store, search "MyChart", open the app, select [facility], and log in with your MyChart username and password.  Masks are optional in the cancer centers. If you would like for your care team to wear a mask while they are taking care of you, please let them know. You may have one support Tiffany [name] who is at least 60 years old accompany you for your appointments.  Dostarlimab Injection What is this medication? DOSTARLIMAB (dos tar li mab) treats some types of cancer. It works by helping your immune system slow or stop the spread of cancer cells. It is a monoclonal antibody. This medicine may be used for other purposes; ask your health care provider or pharmacist if you have questions. COMMON BRAND NAME(S): Jemperli What should I  tell my care team before I take this medication? They need to know if you have any of these conditions: Allogeneic stem cell transplant (uses someone else's stem cells) Autoimmune diseases, such as Crohn disease, ulcerative colitis, lupus History of chest radiation Nervous system problems, such as  Guillain-Barre syndrome, myasthenia gravis Organ transplant An unusual or allergic reaction to dostarlimab, other medications, foods, dyes, or preservatives Pregnant or trying to get pregnant Breast-feeding How should I use this medication? This medication is injected into a vein. It is given by your care team in a hospital or clinic setting. A special MedGuide will be given to you before each treatment. Be sure to read this information carefully each time. Talk to your care team about the use of this medication in children. Special care may be needed. Overdosage: If you think you have taken too much of this medicine contact a poison control center or emergency room at once. NOTE: This medicine is only for you. Do not share this medicine with others. What if I miss a dose? Keep appointments for follow-up doses. It is important not to miss your dose. Call your care team if you are unable to keep an appointment. What may interact with this medication? Interactions have not been studied. This list may not describe all possible interactions. Give your health care provider a list of all the medicines, herbs, non-prescription drugs, or dietary supplements you use. Also tell them if you smoke, drink alcohol, or use illegal drugs. Some items may interact with your medicine. What should I watch for while using this medication? Your condition will be monitored carefully while you are receiving this medication. You may need blood work while taking this medication. This medication may cause serious skin reactions. They can happen weeks to months after starting the medication. Contact your care team right away if you notice fevers or flu-like symptoms with a rash. The rash may be red or purple and then turn into blisters or peeling of the skin. You may also notice a red rash with swelling of the face, lips, or lymph nodes in your neck or under your arms. Tell your care team right away if you have any change in  your eyesight. Talk to your care team if you may be pregnant. Serious birth defects can occur if you take this medication during pregnancy and for 4 months after the last dose. You will need a negative pregnancy test before starting this medication. Contraception is recommended while taking this medication and for 4 months after the last dose. Your care team can help you find the option that works for you. Do not breastfeed while taking this medication and for 4 months after the last dose. What side effects may I notice from receiving this medication? Side effects that you should report to your care team as soon as possible: Allergic reactions--skin rash, itching, hives, swelling of the face, lips, tongue, or throat Dry cough, shortness of breath or trouble breathing Eye pain, redness, irritation, or discharge with blurry or decreased vision Heart muscle inflammation--unusual weakness or fatigue, shortness of breath, chest pain, fast or irregular heartbeat, dizziness, swelling of the ankles, feet, or hands Hormone gland problems--headache, sensitivity to light, unusual weakness or fatigue, dizziness, fast or irregular heartbeat, increased sensitivity to cold or heat, excessive sweating, constipation, hair loss, increased thirst or amount of urine, tremors or shaking, irritability Infusion reactions--chest pain, shortness of breath or trouble breathing, feeling faint or lightheaded Kidney injury (glomerulonephritis)--decrease in the amount of urine,  red or dark brown urine, foamy or bubbly urine, swelling of the ankles, hands, or feet Liver injury--right upper belly pain, loss of appetite, nausea, light-colored stool, dark yellow or brown urine, yellowing skin or eyes, unusual weakness or fatigue Pain, tingling, or numbness in the hands or feet, muscle weakness, change in vision, confusion or trouble speaking, loss of balance or coordination, trouble walking, seizures Rash, fever, and swollen lymph  nodes Redness, blistering, peeling, or loosening of the skin, including inside the mouth Sudden or severe stomach pain, bloody diarrhea, fever, nausea, vomiting Side effects that usually do not require medical attention (report these to your care team if they continue or are bothersome): Bone, joint, or muscle pain Diarrhea Fatigue Loss of appetite Nausea Skin rash This list may not describe all possible side effects. Call your doctor for medical advice about side effects. You may report side effects to FDA at 1-800-FDA-1088. Where should I keep my medication? This medication is given in a hospital or clinic. It will not be stored at home. NOTE: This sheet is a summary. It may not cover all possible information. If you have questions about this medicine, talk to your doctor, pharmacist, or health care provider.  2023 Elsevier/Gold Standard (2021-10-18 00:00:00)  Paclitaxel Injection What is this medication? PACLITAXEL (PAK li TAX el) treats some types of cancer. It works by slowing down the growth of cancer cells. This medicine may be used for other purposes; ask your health care provider or pharmacist if you have questions. COMMON BRAND NAME(S): Onxol, Taxol What should I tell my care team before I take this medication? They need to know if you have any of these conditions: Heart disease Liver disease Low white blood cell levels An unusual or allergic reaction to paclitaxel, other medications, foods, dyes, or preservatives If you or your partner are pregnant or trying to get pregnant Breast-feeding How should I use this medication? This medication is injected into a vein. It is given by your care team in a hospital or clinic setting. Talk to your care team about the use of this medication in children. While it may be given to children for selected conditions, precautions do apply. Overdosage: If you think you have taken too much of this medicine contact a poison control center or  emergency room at once. NOTE: This medicine is only for you. Do not share this medicine with others. What if I miss a dose? Keep appointments for follow-up doses. It is important not to miss your dose. Call your care team if you are unable to keep an appointment. What may interact with this medication? Do not take this medication with any of the following: Live virus vaccines Other medications may affect the way this medication works. Talk with your care team about all of the medications you take. They may suggest changes to your treatment plan to lower the risk of side effects and to make sure your medications work as intended. This list may not describe all possible interactions. Give your health care provider a list of all the medicines, herbs, non-prescription drugs, or dietary supplements you use. Also tell them if you smoke, drink alcohol, or use illegal drugs. Some items may interact with your medicine. What should I watch for while using this medication? Your condition will be monitored carefully while you are receiving this medication. You may need blood work while taking this medication. This medication may make you feel generally unwell. This is not uncommon as chemotherapy can affect healthy cells  as well as cancer cells. Report any side effects. Continue your course of treatment even though you feel ill unless your care team tells you to stop. This medication can cause serious allergic reactions. To reduce the risk, your care team may give you other medications to take before receiving this one. Be sure to follow the directions from your care team. This medication may increase your risk of getting an infection. Call your care team for advice if you get a fever, chills, sore throat, or other symptoms of a cold or flu. Do not treat yourself. Try to avoid being around people who are sick. This medication may increase your risk to bruise or bleed. Call your care team if you notice any unusual  bleeding. Be careful brushing or flossing your teeth or using a toothpick because you may get an infection or bleed more easily. If you have any dental work done, tell your dentist you are receiving this medication. Talk to your care team if you may be pregnant. Serious birth defects can occur if you take this medication during pregnancy. Talk to your care team before breastfeeding. Changes to your treatment plan may be needed. What side effects may I notice from receiving this medication? Side effects that you should report to your care team as soon as possible: Allergic reactions--skin rash, itching, hives, swelling of the face, lips, tongue, or throat Heart rhythm changes--fast or irregular heartbeat, dizziness, feeling faint or lightheaded, chest pain, trouble breathing Increase in blood pressure Infection--fever, chills, cough, sore throat, wounds that don't heal, pain or trouble when passing urine, general feeling of discomfort or being unwell Low blood pressure--dizziness, feeling faint or lightheaded, blurry vision Low red blood cell level--unusual weakness or fatigue, dizziness, headache, trouble breathing Painful swelling, warmth, or redness of the skin, blisters or sores at the infusion site Pain, tingling, or numbness in the hands or feet Slow heartbeat--dizziness, feeling faint or lightheaded, confusion, trouble breathing, unusual weakness or fatigue Unusual bruising or bleeding Side effects that usually do not require medical attention (report to your care team if they continue or are bothersome): Diarrhea Hair loss Joint pain Loss of appetite Muscle pain Nausea Vomiting This list may not describe all possible side effects. Call your doctor for medical advice about side effects. You may report side effects to FDA at 1-800-FDA-1088. Where should I keep my medication? This medication is given in a hospital or clinic. It will not be stored at home. NOTE: This sheet is a summary.  It may not cover all possible information. If you have questions about this medicine, talk to your doctor, pharmacist, or health care provider.  2023 Elsevier/Gold Standard (2021-10-20 00:00:00)  Carboplatin Injection What is this medication? CARBOPLATIN (KAR boe pla tin) treats some types of cancer. It works by slowing down the growth of cancer cells. This medicine may be used for other purposes; ask your health care provider or pharmacist if you have questions. COMMON BRAND NAME(S): Paraplatin What should I tell my care team before I take this medication? They need to know if you have any of these conditions: Blood disorders Hearing problems Kidney disease Recent or ongoing radiation therapy An unusual or allergic reaction to carboplatin, cisplatin, other medications, foods, dyes, or preservatives Pregnant or trying to get pregnant Breast-feeding How should I use this medication? This medication is injected into a vein. It is given by your care team in a hospital or clinic setting. Talk to your care team about the use of this medication  in children. Special care may be needed. Overdosage: If you think you have taken too much of this medicine contact a poison control center or emergency room at once. NOTE: This medicine is only for you. Do not share this medicine with others. What if I miss a dose? Keep appointments for follow-up doses. It is important not to miss your dose. Call your care team if you are unable to keep an appointment. What may interact with this medication? Medications for seizures Some antibiotics, such as amikacin, gentamicin, neomycin, streptomycin, tobramycin Vaccines This list may not describe all possible interactions. Give your health care provider a list of all the medicines, herbs, non-prescription drugs, or dietary supplements you use. Also tell them if you smoke, drink alcohol, or use illegal drugs. Some items may interact with your medicine. What should I  watch for while using this medication? Your condition will be monitored carefully while you are receiving this medication. You may need blood work while taking this medication. This medication may make you feel generally unwell. This is not uncommon, as chemotherapy can affect healthy cells as well as cancer cells. Report any side effects. Continue your course of treatment even though you feel ill unless your care team tells you to stop. In some cases, you may be given additional medications to help with side effects. Follow all directions for their use. This medication may increase your risk of getting an infection. Call your care team for advice if you get a fever, chills, sore throat, or other symptoms of a cold or flu. Do not treat yourself. Try to avoid being around people who are sick. Avoid taking medications that contain aspirin, acetaminophen, ibuprofen, naproxen, or ketoprofen unless instructed by your care team. These medications may hide a fever. Be careful brushing or flossing your teeth or using a toothpick because you may get an infection or bleed more easily. If you have any dental work done, tell your dentist you are receiving this medication. Talk to your care team if you wish to become pregnant or think you might be pregnant. This medication can cause serious birth defects. Talk to your care team about effective forms of contraception. Do not breast-feed while taking this medication. What side effects may I notice from receiving this medication? Side effects that you should report to your care team as soon as possible: Allergic reactions--skin rash, itching, hives, swelling of the face, lips, tongue, or throat Infection--fever, chills, cough, sore throat, wounds that don't heal, pain or trouble when passing urine, general feeling of discomfort or being unwell Low red blood cell level--unusual weakness or fatigue, dizziness, headache, trouble breathing Pain, tingling, or numbness in  the hands or feet, muscle weakness, change in vision, confusion or trouble speaking, loss of balance or coordination, trouble walking, seizures Unusual bruising or bleeding Side effects that usually do not require medical attention (report to your care team if they continue or are bothersome): Hair loss Nausea Unusual weakness or fatigue Vomiting This list may not describe all possible side effects. Call your doctor for medical advice about side effects. You may report side effects to FDA at 1-800-FDA-1088. Where should I keep my medication? This medication is given in a hospital or clinic. It will not be stored at home. NOTE: This sheet is a summary. It may not cover all possible information. If you have questions about this medicine, talk to your doctor, pharmacist, or health care provider.  2023 Elsevier/Gold Standard (2021-09-27 00:00:00)\[id number]\

## 2022-06-05 ENCOUNTER — Telehealth: Payer: Self-pay

## 2022-06-05 ENCOUNTER — Other Ambulatory Visit: Payer: Self-pay | Admitting: Oncology

## 2022-06-05 NOTE — Progress Notes (Signed)
Gynecologic Oncology Multi-Disciplinary Disposition Conference Note  Date of the Conference: 06/05/2022  Patient Name: Tiffany [name]  Referring Provider: Dr. Matthew Saras Primary GYN Oncologist: Dr. 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 Disposition is to MRI and PET scan after completion of adjuvant chemotherapy.  Consideration for radiation if further treatment is needed after repeat imaging.   This Multidisciplinary conference took place involving physicians from [facility], Medical Oncology, Radiation Oncology, Pathology, Radiology along with the Gynecologic Oncology Nurse Practitioner and Gynecologic Oncology Nurse Navigator.  Comprehensive assessment of the patient's malignancy, staging, need for surgery, chemotherapy, radiation therapy, and need for further testing were reviewed. Supportive measures, both inpatient and following discharge were also discussed. The recommended plan of care is documented. Greater than 35 minutes were spent correlating and coordinating this patient's care.

## 2022-06-05 NOTE — Telephone Encounter (Signed)
Pt called office in hopes to speak to Dr.Tucker. She is aware Berline Lopes is out of the office today but I would forward a message.  Pt would like to discuss the MRI results in further detail.  CB# [id number]

## 2022-06-06 ENCOUNTER — Other Ambulatory Visit: Payer: Self-pay

## 2022-06-06 ENCOUNTER — Other Ambulatory Visit (HOSPITAL_COMMUNITY): Payer: Self-pay

## 2022-06-06 ENCOUNTER — Telehealth: Payer: Self-pay | Admitting: Surgery

## 2022-06-06 NOTE — Telephone Encounter (Signed)
06/05/2022 this nurse received [facility] paperwork signed by physician.  Returned via fax.  Form to [facility] bin for items to be scanned.  Envelope addressed to patient to registration ABC-file folder for patient pick up 06/09/2022 completes process.  No further instructions received or actions performed by this nurse.

## 2022-06-06 NOTE — Telephone Encounter (Signed)
Patient called stating she is having some pink discharge when she tries to have a BM. States this has started occurring since starting chemotherapy. Denies current constipation but was having constipation for a few days following chemotherapy. Discharge is only present on tissue when she wipes. Patient concerned that incision could be opening and also that she may be too sore to tolerate an exam at her upcoming appointment with Dr Berline Lopes on Friday. Advised patient to ensure she is taking stool softeners and to drink plenty of water. Advised patient that Dr Berline Lopes notified and our office will call her if Dr Berline Lopes has additional recommendations.

## 2022-06-09 ENCOUNTER — Telehealth: Payer: Self-pay | Admitting: Oncology

## 2022-06-09 ENCOUNTER — Encounter: Payer: Self-pay | Admitting: Gynecologic Oncology

## 2022-06-09 ENCOUNTER — Other Ambulatory Visit: Payer: Self-pay

## 2022-06-09 ENCOUNTER — Telehealth: Payer: Self-pay | Admitting: *Deleted

## 2022-06-09 ENCOUNTER — Inpatient Hospital Stay (HOSPITAL_BASED_OUTPATIENT_CLINIC_OR_DEPARTMENT_OTHER): Payer: No Typology Code available for payment source | Admitting: Gynecologic Oncology

## 2022-06-09 ENCOUNTER — Encounter (HOSPITAL_BASED_OUTPATIENT_CLINIC_OR_DEPARTMENT_OTHER): Payer: Self-pay | Admitting: Gynecologic Oncology

## 2022-06-09 VITALS — BP 146/83 | HR 100 | Temp 98.5°F | Resp 16 | Ht 63.0 in | Wt 134.0 lb

## 2022-06-09 DIAGNOSIS — C579 Malignant neoplasm of female genital organ, unspecified: Secondary | ICD-10-CM

## 2022-06-09 DIAGNOSIS — Z7189 Other specified counseling: Secondary | ICD-10-CM

## 2022-06-09 DIAGNOSIS — D259 Leiomyoma of uterus, unspecified: Secondary | ICD-10-CM

## 2022-06-09 DIAGNOSIS — Z90722 Acquired absence of ovaries, bilateral: Secondary | ICD-10-CM

## 2022-06-09 DIAGNOSIS — Z90710 Acquired absence of both cervix and uterus: Secondary | ICD-10-CM

## 2022-06-09 NOTE — Telephone Encounter (Signed)
Called Tiffany [name] and advised her that her procedure will be on 06/14/22 midmorning and that she will be notified of the exact time.  She verbalized understanding and agreement.

## 2022-06-09 NOTE — Telephone Encounter (Signed)
[facility] faxed request to collaborative for 01/09/2022 through 06/08/2022 record information described below. Diagnostic test results  Medications and any side effects Office notes with physical exam findings Operative pr Procedural Notes Outline of Treatment Plan Physical Therapy/Occupational Therapy  Message left for Tiffany [name]'s assigned [facility] analyst Amy to resend request to main office fax number [phone] addressed to legal name "Tiffany [name]", (SW) [facility] unable to comply with request addressed to a provider.  This nurse direct line [phone] provided for any questions.

## 2022-06-09 NOTE — Progress Notes (Signed)
Gynecologic Oncology Return Clinic Visit  06/09/22  Reason for Visit: follow-up  Treatment History: Oncology History Overview Note  HER2 negative, Neg Genetics   Uterine cancer ([place])  12/03/2021 Initial Diagnosis   Patient reports several month history of intermittent pelvic pain that she describes as discomfort or sometimes cramping, that has worsened with time.  She notes that this is tolerable.  She began having postmenopausal bleeding started on 6/17 with a few spots of blood.     12/29/2021 Imaging   1. Bilateral large multiloculated cystic lesions of the ovaries, concerning for ovarian malignancy. Recommend gynecologic consultation and pelvic ultrasound and/or pelvic MRI with contrast for further evaluation. 2. Large heterogeneous mass of the posterior pelvis likely arising from the lower uterus, possibly a fibroid, although malignancy is also a concern. Recommend attention on pelvic ultrasound or MRI as above. 3. Additional mass arising from the right uterine fundus which correlates with fibroid described on prior 2015 ultrasound. 4. Nonspecific small ground-glass nodule of the right lower lobe measuring 6 mm. Recommend follow-up chest CT in 6 months for further evaluation. 5. Aortic Atherosclerosis (ICD10-I70.0).   12/30/2021 Pathology Results   A. CERVIX, BIOPSY:  -  Scantly cellular specimen with predominantly blood, fibrin and acute inflammatory infiltrate but with scattered highly atypical cells highly suspicious for malignancy.   Note: A p16 shows strong positivity in these scattered cells; however, while p53 is mildly increased definitive strong overexpression or under expression is not identified.  These findings support the suspicion for malignancy; however, are insufficient for a definitive diagnosis.     01/03/2022 Imaging   MRI pelvis  1. Large mixed solid and cystic lesions of the bilateral ovaries, measuring 8.4 x 7.0 x 5.4 cm on the right and 10.3 x 8.7 x 8.1 cm on the  left, highly concerning for primary ovarian malignancy and contralateral ovarian metastasis.   2. Homogeneously enhancing mass arising from the right aspect of the uterine body and fundus measuring 6.0 x 5.5 x 4.3 cm, consistent with a uterine fibroid and similar to prior ultrasound dated 2015.   3. An additional, much more heterogeneous mass which appears to arise from the cervix or posterior lower uterine segment measuring 8.1 x 6.4 x 6.2 cm. This may reflect an additional fibroid with internal degeneration, however this was not clearly visualized on ultrasound dated 2015 and is highly suspicious for a cervical mass as significant enlargement of benign fibroids is not expected following menopause. This mass appears to closely abut and compress or perhaps even directly involve the adjacent rectum.   4.  No evidence of lymphadenopathy in the pelvis.   01/12/2022 PET scan   1. Hypermetabolic heterogeneous mass centered on the cervix/lower uterine segment is most consistent with primary gynecologic neoplasm. 2. Multiloculated complex bilateral adnexal cystic lesions with hypermetabolic soft tissue component likely reflect metastatic lesions. 3. No hypermetabolic abdominopelvic adenopathy. 4. No evidence of hypermetabolic metastatic disease in the chest or abdomen.   01/18/2022 Pathology Results   FINAL MICROSCOPIC DIAGNOSIS:   A. CERVICAL MASS, BIOPSIES:  - Poorly differentiated carcinoma with necrosis, consistent with high-grade serous carcinoma, see comment   B. CERVICAL MASS, EXCISION:  - Poorly differentiated carcinoma with necrosis, consistent with high-grade serous carcinoma see comment  - Edges of resected tissue fragments are positive for carcinoma   COMMENT:   A and B.   The tumor cells are positive for PAX8, ER and p16 immunostains.  Immunostain for p53 shows significant overexpression. Immunostain for CK5/6 shows weak  focal labeling while immunostain for  p63 is negative.  The  immunoprofile is consistent with above interpretation.  The carcinoma is likely endometrial or ovarian origin with secondary involvement of the cervix.     01/23/2022 Initial Diagnosis   Uterine cancer ([place]iffany [name])   01/23/2022 Cancer Staging   Staging form: Corpus Uteri - Carcinoma and Carcinosarcoma, AJCC 8th Edition - Clinical stage from 01/23/2022: FIGO Stage IIIA (cT3a, cN0, cM0) - Signed by [name], MD on 01/23/2022 Stage prefix: Initial diagnosis   01/31/2022 - 01/31/2022 Chemotherapy   Patient is on Treatment Plan : UTERINE ENDOMETRIAL Dostarlimab-gxly (500 mg) + Carboplatin (AUC 5) + Paclitaxel (175 mg/m2) q21d x 6 cycles / Dostarlimab-gxly (1000 mg) q42d x 6 cycles      01/31/2022 -  Chemotherapy   Patient is on Treatment Plan : UTERINE ENDOMETRIAL Dostarlimab-gxly (500 mg) + Carboplatin (AUC 5) + Paclitaxel (175 mg/m2) q21d x 6 cycles / Dostarlimab-gxly (1000 mg) q42d x 6 cycles      04/14/2022 PET scan   1. Significant interval positive response to therapy. Mildly hypermetabolic solid 5.5 x 2.8 cm posterior uterine cervix mass and mildly hypermetabolic bilateral adnexal mixed cystic and solid masses are all significantly decreased in size and metabolism. 2. No new or progressive hypermetabolic metastatic disease. 3. Low level FDG uptake associated with healing subacute sclerotic nondisplaced anterior left third, fourth and fifth rib fractures without associated discrete osseous lesions, correlate for history of interval injury in this location. 4. Horseshoe kidney.   05/03/2022 Surgery   Robotic-assisted laparoscopic total hysterectomy with bilateral salpingo-oophorectomy, tumor debulking including infracolic omentectomy, mini-lap, cystoscopy, and vaginal laceration repair   Findings:  On speculum exam, normal-appearing cervix that is flush with the vaginal apex.  No visible cervical lesion.  On EUA, moderately mobile, enlarged uterus with mobile fullness appreciated in the cul-de-sac.  On  intra-abdominal entry, normal upper abdominal survey.  Some adhesions of the omentum to the anterior abdominal wall below the umbilicus were noted.  Normal-appearing omentum, small and large bowel.  No ascites.  Right ovary minimally enlarged but overall normal in appearance.  Left ovary replaced by a 6 cm cystic mass, smooth.  Normal-appearing bilateral fallopian tubes.  Bladder was somewhat adherent anteriorly to the cervix.  Uterus approximately 8 cm and deviated to the left given a 10 cm anterior and right-sided fibroid.  The cervix itself did not appear enlarged, either on intra-abdominal phonation or speculum exam.  Posteriorly, a white ridge of somewhat indurated tissue was noted underneath the cervix and just above the rectum.  On palpation, this ridge could be appreciated only on rectovaginal exam.  It was separate and free from the cervix itself.  It was difficult to distinguish whether this was treated tumor or active tumor although its detached location from the cervix and uterus was noted.  Given my conversation with the patient prior to surgery and that she would not want an ostomy (even temporarily), there was no way to resect this area out without significant risk of damage to the rectum.  Given its location, even in the setting of a bowel prep (which she had not had), she would likely require at least a protective diverting ostomy. Cystoscopy, bladder dome noted to be intact and good efflux seen from bilateral ureteral orifices.   05/03/2022 Pathology Results   A. UTERUS, CERVIX, BILATERAL TUBES AND OVARIES, RESECTION:  - Cervix      Nabothian cysts      Negative for malignancy  - Endometrium  Inactive endometrium      Two benign endometrial polyps      Negative for malignancy  - Myometrium      Leiomyoma      Negative for malignancy  - Right ovary      Hemorrhagic cyst with fibrosis, hemosiderin and focal calcification      Negative for malignancy  - Left ovary       Hemorrhagic cyst with fibrosis, hemosiderin and focal calcification      Negative for malignancy  - Bilateral fallopian tubes      Unremarkable      Negative for malignancy  - See oncology table   B. OMENTUM, EXCISION:  - Benign omental adipose tissue.  - Negative for malignancy.     Genetic Testing   Negative genetic testing. No pathogenic variants identified on the [facility] CancerNext-Expanded+RNA panel. VUS in [place] called [id number] identified. The report date is 05/03/2022.  The CancerNext-Expanded + RNAinsight gene panel offered by [employer] and includes sequencing and rearrangement analysis for the following 77 genes: IP, ALK, APC*, ATM*, AXIN2, BAP1, BARD1, BLM, BMPR1A, BRCA1*, BRCA2*, BRIP1*, CDC73, CDH1*,CDK4, CDKN1B, CDKN2A, CHEK2*, CTNNA1, DICER1, FANCC, FH, FLCN, GALNT12, KIF1B, LZTR1, MAX, MEN1, MET, MLH1*, MSH2*, MSH3, MSH6*, MUTYH*, NBN, NF1*, NF2, NTHL1, PALB2*, PHOX2B, PMS2*, POT1, PRKAR1A, PTCH1, PTEN*, RAD51C*, RAD51D*,RB1, RECQL, RET, SDHA, SDHAF2, SDHB, SDHC, SDHD, SMAD4, SMARCA4, SMARCB1, SMARCE1, STK11, SUFU, TMEM127, TP53*,TSC1, TSC2, VHL and XRCC2 (sequencing and deletion/duplication); EGFR, EGLN1, HOXB13, KIT, MITF, PDGFRA, POLD1 and POLE (sequencing only); EPCAM and GREM1 (deletion/duplication only).     Interval History: Reports some pelvic bone pain.  She also endorses some pain and what she thinks is the area between her vagina and her rectum.  She had noted slight increase in pink discharge after her chemotherapy last week.  He describes this is not seeing any pink or red when she urinates or on her underwear.  She notices a little pink on the tissue after she wipes.  She endorses normal bowel function.  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   oncologist-- dr Alvy Bimler;   dx 06/ 2023   Multinodular thyroid    bilateral nodules ;  hx bx 07-31-2017 benign ;  last ultrasound in epic 01-08-2020   Neuropathy due to chemotherapeutic drug [facility])    Uterine cancer [name]) 01/2022   gyn oncologist-- dr Marthann Abshier/  oncologist--- dr Alvy Bimler;  dx 01-23-2022,  started chemo 01-31-2022    Past Surgical History:  Procedure Laterality Date   CESAREAN SECTION     COLONOSCOPY  2016   dr Jeneen Rinks [facility])   CYSTOSCOPY N/A 05/03/2022   Procedure: CYSTOSCOPY;  Surgeon: [name], MD;  Location: [facility];  Service: Gynecology;  Laterality: N/A;   DILATION AND EVACUATION  08/17/2000   _0  for missed ab   IR IMAGING GUIDED PORT INSERTION  01/25/2022   LESION REMOVAL N/A 01/18/2022   Procedure: CERVICAL BIOPSIES;  Surgeon: [name], MD;  Location: [facility];  Service: Gynecology;  Laterality: N/A;   ROBOTIC ASSISTED TOTAL HYSTERECTOMY WITH BILATERAL SALPINGO OOPHERECTOMY  05/03/2022   _1  by dr Berline Lopes ;   with mini lap, omentectomy, tumor debulking    Family History  Problem Relation Age of Onset  Thyroid disease Mother    Breast cancer Mother        dx late 78s   Cancer Other        unk type, d. 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  Tobacco Use   Smoking status: Never   Smokeless tobacco: Never  Vaping Use   Vaping Use: Never used  Substance and Sexual Activity   Alcohol use: Never   Drug use: Never   Sexual activity: Not Currently  Other Topics Concern   Not on file  Social History Narrative   Not on file   Social Determinants  of Health   Financial Resource Strain: Not on file  Food Insecurity: No Food Insecurity (05/03/2022)   Hunger Vital Sign    Worried About Running Out of Food in the Last Year: Never true    Ran Out of Food in the Last Year: Never true  Transportation Needs: No Transportation Needs (05/03/2022)   PRAPARE - Hydrologist (Medical): No    Lack of Transportation (Non-Medical): No  Physical Activity: Not on file  Stress: Not on file  Social Connections: Not on file    Current Medications:  Current Outpatient Medications:    Cholecalciferol (VITAMIN D3 PO), Take 1 tablet by mouth daily., Disp: , Rfl:    dexamethasone (DECADRON) 4 MG tablet, Take 2 tablets the night before and 2 tablets the morning of chemotherapy, every 3 weeks, by mouth for 6 cycles, Disp: 36 tablet, Rfl: 6   diazepam (VALIUM) 5 MG tablet, Take 1 tablet (5 mg total) by mouth once for 1 dose. Take one hour prior to appointment with Dr. Berline Lopes. You will need a driver to and from, Disp: 1 tablet, Rfl: 0   miconazole (MICATIN) 2 % cream, Apply to the vulva twice daily as needed for itching, Disp: 30 g, Rfl: 0   Multiple Vitamin (MULTIVITAMIN WITH MINERALS) TABS tablet, Take 1 tablet by mouth daily., Disp: , Rfl:    ondansetron (ZOFRAN) 4 MG tablet, Take 4 mg by mouth every 8 (eight) hours as needed for nausea or vomiting., Disp: , Rfl:    prochlorperazine (COMPAZINE) 10 MG tablet, Take 10 mg by mouth every 6 (six) hours as needed for nausea or vomiting., Disp: , Rfl:   Review of Systems: + abdominal pain, vaginal bleeding Denies appetite changes, fevers, chills, fatigue, unexplained weight changes. Denies hearing loss, neck lumps or masses, mouth sores, ringing in ears or voice changes. Denies cough or wheezing.  Denies shortness of breath. Denies chest pain or palpitations. Denies leg swelling. Denies abdominal distention, pain, blood in stools, constipation, diarrhea, nausea, vomiting, or early  satiety. Denies pain with intercourse, dysuria, frequency, hematuria or incontinence. Denies hot flashes, pelvic pain, vaginal bleeding or vaginal discharge.   Denies joint pain, back pain or muscle pain/cramps. Denies itching, rash, or wounds. Denies dizziness, headaches, numbness or seizures. Denies swollen lymph nodes or glands, denies easy bruising or bleeding. Denies anxiety, depression,  confusion, or decreased concentration.  Physical Exam: BP (!) 146/83 (BP Location: Left Arm, Patient Position: Sitting)   Pulse 100   Temp 98.5 F (36.9 C) (Oral)   Resp 16   Ht _0  (1.6 m)   Wt 134 lb (60.8 kg)   SpO2 100%   BMI 23.74 kg/m  General: Alert, oriented, no acute distress. HEENT: Normocephalic, atraumatic, sclera anicteric. Chest: Unlabored breathing on room air.  GU: Normal appearing external genitalia without erythema, excoriation, or lesions.  There are some adhesions that I suspect developed after surgery along the posterior aspect of the vagina, narrowing the introitus some.  Speculum exam not tolerated by the patient.  On single-digit exam, I am not quite able to reach up to the vaginal cuff but I do not palpate any masses or nodularity with my exam.  Even the single digit exam is very poorly tolerated by the patient.  Laboratory & Radiologic Studies: None new  Assessment & Plan: Tiffany [name] is a 60 y.o. 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   Discussed with the patient that based on my limited exam, I do not feel or see any abnormalities nor cause of her very small quantity of spotting.  Given inability to perform a pelvic exam even with premedication use, I offered that we could plan for an outpatient exam under anesthesia.  We had initially talked about doing this after her adjuvant therapy to assess the rectovaginal septum.  She is amenable to scheduling this next week but will call me early in the week if spotting stops.  She has a virtual second  opinion with [facility] after the first of the year.  We spent some time again discussing the difficulties of her cancer diagnosis, uncertainty of the origin of her cancer, and difficulty of living with the fear related to cancer recurrence.  28 minutes of total time was spent for this patient encounter, including preparation, face-to-face counseling with the patient and coordination of care, and documentation of the encounter.  Jeral Pinch, MD  Division of Gynecologic Oncology  Department of Obstetrics and Gynecology  [facility]

## 2022-06-09 NOTE — Patient Instructions (Signed)
Melissa will call you this afternoon with a time and date for the exam under anesthesia. I am hopeful we can give you some pain medications through the IV, without putting you to sleep completely, to be able to do a good exam.

## 2022-06-13 ENCOUNTER — Encounter (HOSPITAL_BASED_OUTPATIENT_CLINIC_OR_DEPARTMENT_OTHER): Payer: Self-pay | Admitting: Gynecologic Oncology

## 2022-06-13 ENCOUNTER — Encounter: Payer: Self-pay | Admitting: Surgery

## 2022-06-13 ENCOUNTER — Other Ambulatory Visit: Payer: Self-pay | Admitting: Gynecologic Oncology

## 2022-06-13 ENCOUNTER — Encounter (HOSPITAL_COMMUNITY): Payer: Self-pay | Admitting: Anesthesiology

## 2022-06-13 ENCOUNTER — Telehealth: Payer: Self-pay | Admitting: Surgery

## 2022-06-13 DIAGNOSIS — N952 Postmenopausal atrophic vaginitis: Secondary | ICD-10-CM

## 2022-06-13 MED ORDER — ESTROGENS CONJUGATED 0.625 MG/GM VA CREA: 1.0000 | TOPICAL_CREAM | VAGINAL | 1 refills | Status: DC

## 2022-06-13 NOTE — Telephone Encounter (Signed)
Patient informed of vaginal estrogen cream prescription via mychart. Office number provided for any questions.

## 2022-06-13 NOTE — Telephone Encounter (Addendum)
Called patient for pre-op. Patient informed that she no longer wants to go through with surgery. States "it doesn't make sense for me to go under anesthesia and all of that for an exam, there should be another way." Patient states that she has previously been given a "hormone cream" to thicken the vaginal tissues and that that worked and she was able to tolerate an exam. Patient unsure of name of cream, stating she would call her pharmacy to obtain the name of it. (Patient called back and states cream was estradiol cream 0.1%) States that after her exam Friday she had more spotting and feels the area is thinning more and wants to give the area time to heal. Patient advised that Dr Berline Lopes would be notified of her wishes and our office would be in contact for next steps.

## 2022-06-13 NOTE — Anesthesia Preprocedure Evaluation (Signed)
Anesthesia Evaluation    Reviewed: Allergy & Precautions, Patient's Chart, lab work & pertinent test results  History of Anesthesia Complications Negative for: history of anesthetic complications  Airway        Dental   Pulmonary neg pulmonary ROS, neg shortness of breath, neg COPD, neg recent URI          Cardiovascular negative cardio ROS      Neuro/Psych  PSYCHIATRIC DISORDERS Anxiety Depression     Neuromuscular disease    GI/Hepatic Neg liver ROS,GERD  ,,  Endo/Other  negative endocrine ROS    Renal/GU negative Renal ROS     Musculoskeletal  (+) Arthritis ,    Abdominal   Peds  Hematology Lab Results      Component                Value               Date                      WBC                      7.0                 04/28/2022                HGB                      12.0                04/28/2022                HCT                      37.0                04/28/2022                MCV                      77.2 (L)            04/28/2022                PLT                      126 (L)             04/28/2022              Anesthesia Other Findings   Reproductive/Obstetrics                             Anesthesia Physical Anesthesia Plan  ASA: 3  Anesthesia Plan: General   Post-op Pain Management: Tylenol PO (pre-op)* and Toradol IV (intra-op)*   Induction: Intravenous  PONV Risk Score and Plan: 3 and Ondansetron, Dexamethasone, Midazolam, Propofol infusion and TIVA  Airway Management Planned: LMA  Additional Equipment: None  Intra-op Plan:   Post-operative Plan: Extubation in OR  Informed Consent:   Plan Discussed with: Anesthesiologist  Anesthesia Plan Comments:        Anesthesia Quick Evaluation

## 2022-06-13 NOTE — Progress Notes (Signed)
Patient stated that she called her surgeon's office at 8am this morning and cancelled her surgery for 06/14/2022.

## 2022-06-14 ENCOUNTER — Telehealth: Payer: Self-pay

## 2022-06-14 ENCOUNTER — Ambulatory Visit (HOSPITAL_BASED_OUTPATIENT_CLINIC_OR_DEPARTMENT_OTHER)
Admission: RE | Admit: 2022-06-14 | Payer: No Typology Code available for payment source | Source: Home / Self Care | Admitting: Gynecologic Oncology

## 2022-06-14 DIAGNOSIS — Z01411 Encounter for gynecological examination (general) (routine) with abnormal findings: Secondary | ICD-10-CM

## 2022-06-14 DIAGNOSIS — C579 Malignant neoplasm of female genital organ, unspecified: Secondary | ICD-10-CM

## 2022-06-14 HISTORY — DX: Drug-induced polyneuropathy: G62.0

## 2022-06-14 HISTORY — DX: Personal history of adenomatous and serrated colon polyps: Z86.0101

## 2022-06-14 HISTORY — DX: Nontoxic multinodular goiter: E04.2

## 2022-06-14 HISTORY — DX: Adverse effect of antineoplastic and immunosuppressive drugs, initial encounter: T45.1X5A

## 2022-06-14 HISTORY — DX: Personal history of colonic polyps: Z86.010

## 2022-06-14 SURGERY — EXAM UNDER ANESTHESIA
Anesthesia: Choice

## 2022-06-14 NOTE — Telephone Encounter (Signed)
The [place] forms (claim# [id number]) faxed and confirmed.

## 2022-06-15 ENCOUNTER — Telehealth: Payer: Self-pay

## 2022-06-15 NOTE — Telephone Encounter (Signed)
T/C from pt asking if it is ok for her to use the Premarin vaginal cream Dr Berline Lopes prescribed for vaginal atrophy or should she wait.  LM for pt it os OK to use the estrogen vaginal cream.  It is just not recommended to use the patches or oral estrogen.

## 2022-06-16 ENCOUNTER — Other Ambulatory Visit (HOSPITAL_COMMUNITY): Payer: Self-pay

## 2022-06-19 ENCOUNTER — Encounter: Payer: Self-pay | Admitting: Hematology and Oncology

## 2022-06-22 MED FILL — Dexamethasone Sodium Phosphate Inj 100 MG/10ML: INTRAMUSCULAR | Qty: 1 | Status: AC

## 2022-06-22 MED FILL — Fosaprepitant Dimeglumine For IV Infusion 150 MG (Base Eq): INTRAVENOUS | Qty: 5 | Status: AC

## 2022-06-22 NOTE — Telephone Encounter (Signed)
Mckinzey Fitzwater emailed letter received from [employer] to forms email.  [place] requested records described in 06/09/2022 phone encounter from OB/GYN, GYN Oncology and Medical Oncology.  This nurse successfully faxed letter with [facility] [employer] to [facility], GYN/Oncology and [facility] to process this request.

## 2022-06-23 ENCOUNTER — Encounter: Payer: Self-pay | Admitting: Hematology and Oncology

## 2022-06-23 ENCOUNTER — Other Ambulatory Visit (HOSPITAL_COMMUNITY): Payer: Self-pay

## 2022-06-23 ENCOUNTER — Inpatient Hospital Stay: Payer: 59

## 2022-06-23 ENCOUNTER — Encounter: Payer: Self-pay | Admitting: Oncology

## 2022-06-23 ENCOUNTER — Other Ambulatory Visit: Payer: Self-pay

## 2022-06-23 ENCOUNTER — Inpatient Hospital Stay: Payer: 59 | Admitting: Licensed Clinical Social Worker

## 2022-06-23 ENCOUNTER — Inpatient Hospital Stay: Payer: 59 | Attending: Gynecologic Oncology | Admitting: Hematology and Oncology

## 2022-06-23 VITALS — BP 169/94 | HR 111 | Temp 98.8°F | Resp 18 | Ht 63.0 in | Wt 135.2 lb

## 2022-06-23 VITALS — HR 98

## 2022-06-23 DIAGNOSIS — Z5111 Encounter for antineoplastic chemotherapy: Secondary | ICD-10-CM | POA: Diagnosis present

## 2022-06-23 DIAGNOSIS — S3141XA Laceration without foreign body of vagina and vulva, initial encounter: Secondary | ICD-10-CM | POA: Insufficient documentation

## 2022-06-23 DIAGNOSIS — Z79899 Other long term (current) drug therapy: Secondary | ICD-10-CM | POA: Diagnosis not present

## 2022-06-23 DIAGNOSIS — C539 Malignant neoplasm of cervix uteri, unspecified: Secondary | ICD-10-CM

## 2022-06-23 DIAGNOSIS — Z5112 Encounter for antineoplastic immunotherapy: Secondary | ICD-10-CM | POA: Diagnosis present

## 2022-06-23 DIAGNOSIS — D696 Thrombocytopenia, unspecified: Secondary | ICD-10-CM | POA: Insufficient documentation

## 2022-06-23 DIAGNOSIS — T451X5A Adverse effect of antineoplastic and immunosuppressive drugs, initial encounter: Secondary | ICD-10-CM | POA: Insufficient documentation

## 2022-06-23 DIAGNOSIS — Z90710 Acquired absence of both cervix and uterus: Secondary | ICD-10-CM | POA: Insufficient documentation

## 2022-06-23 DIAGNOSIS — N898 Other specified noninflammatory disorders of vagina: Secondary | ICD-10-CM | POA: Insufficient documentation

## 2022-06-23 DIAGNOSIS — C55 Malignant neoplasm of uterus, part unspecified: Secondary | ICD-10-CM | POA: Insufficient documentation

## 2022-06-23 DIAGNOSIS — G620 Drug-induced polyneuropathy: Secondary | ICD-10-CM | POA: Diagnosis not present

## 2022-06-23 DIAGNOSIS — Q631 Lobulated, fused and horseshoe kidney: Secondary | ICD-10-CM | POA: Diagnosis not present

## 2022-06-23 LAB — CBC WITH DIFFERENTIAL (CANCER CENTER ONLY)
Abs Immature Granulocytes: 0.08 10*3/uL — ABNORMAL HIGH (ref 0.00–0.07)
Basophils Absolute: 0 10*3/uL (ref 0.0–0.1)
Basophils Relative: 0 %
Eosinophils Absolute: 0 10*3/uL (ref 0.0–0.5)
Eosinophils Relative: 0 %
HCT: 36.9 % (ref 36.0–46.0)
Hemoglobin: 12.6 g/dL (ref 12.0–15.0)
Immature Granulocytes: 1 %
Lymphocytes Relative: 16 %
Lymphs Abs: 1.5 10*3/uL (ref 0.7–4.0)
MCH: 25.2 pg — ABNORMAL LOW (ref 26.0–34.0)
MCHC: 34.1 g/dL (ref 30.0–36.0)
MCV: 73.8 fL — ABNORMAL LOW (ref 80.0–100.0)
Monocytes Absolute: 0.2 10*3/uL (ref 0.1–1.0)
Monocytes Relative: 2 %
Neutro Abs: 7.5 10*3/uL (ref 1.7–7.7)
Neutrophils Relative %: 81 %
Platelet Count: 168 10*3/uL (ref 150–400)
RBC: 5 MIL/uL (ref 3.87–5.11)
RDW: 14.8 % (ref 11.5–15.5)
WBC Count: 9.2 10*3/uL (ref 4.0–10.5)
nRBC: 0 % (ref 0.0–0.2)

## 2022-06-23 LAB — CMP (CANCER CENTER ONLY)
ALT: 29 U/L (ref 0–44)
AST: 19 U/L (ref 15–41)
Albumin: 4.7 g/dL (ref 3.5–5.0)
Alkaline Phosphatase: 81 U/L (ref 38–126)
Anion gap: 11 (ref 5–15)
BUN: 15 mg/dL (ref 6–20)
CO2: 23 mmol/L (ref 22–32)
Calcium: 10 mg/dL (ref 8.9–10.3)
Chloride: 106 mmol/L (ref 98–111)
Creatinine: 0.65 mg/dL (ref 0.44–1.00)
GFR, Estimated: >60 mL/min (ref >60)
Glucose, Bld: 178 mg/dL — ABNORMAL HIGH (ref 70–99)
Potassium: 3.9 mmol/L (ref 3.5–5.1)
Sodium: 140 mmol/L (ref 135–145)
Total Bilirubin: 0.3 mg/dL (ref 0.3–1.2)
Total Protein: 7.1 g/dL (ref 6.5–8.1)

## 2022-06-23 LAB — TSH: TSH: 1.349 u[IU]/mL (ref 0.350–4.500)

## 2022-06-23 MED ORDER — SODIUM CHLORIDE 0.9 % IV SOLN
500.0000 mg | Freq: Once | INTRAVENOUS | Status: AC
  Administered 2022-06-23: 500 mg via INTRAVENOUS
  Filled 2022-06-23: qty 10

## 2022-06-23 MED ORDER — PALONOSETRON HCL INJECTION 0.25 MG/5ML
0.2500 mg | Freq: Once | INTRAVENOUS | Status: AC
  Administered 2022-06-23: 0.25 mg via INTRAVENOUS
  Filled 2022-06-23: qty 5

## 2022-06-23 MED ORDER — HEPARIN SOD (PORK) LOCK FLUSH 100 UNIT/ML IV SOLN
500.0000 [IU] | Freq: Once | INTRAVENOUS | Status: AC | PRN
  Administered 2022-06-23: 500 [IU]

## 2022-06-23 MED ORDER — SODIUM CHLORIDE 0.9 % IV SOLN
10.0000 mg | Freq: Once | INTRAVENOUS | Status: AC
  Administered 2022-06-23: 10 mg via INTRAVENOUS
  Filled 2022-06-23: qty 10

## 2022-06-23 MED ORDER — DEXAMETHASONE 4 MG PO TABS
ORAL_TABLET | ORAL | 6 refills | Status: DC
  Filled 2022-06-23: qty 4, fill #0
  Filled 2022-07-07: qty 4, 21d supply, fill #0

## 2022-06-23 MED ORDER — SODIUM CHLORIDE 0.9% FLUSH
10.0000 mL | Freq: Once | INTRAVENOUS | Status: AC
  Administered 2022-06-23: 10 mL

## 2022-06-23 MED ORDER — SODIUM CHLORIDE 0.9 % IV SOLN
175.0000 mg/m2 | Freq: Once | INTRAVENOUS | Status: AC
  Administered 2022-06-23: 288 mg via INTRAVENOUS
  Filled 2022-06-23: qty 48

## 2022-06-23 MED ORDER — SODIUM CHLORIDE 0.9% FLUSH
10.0000 mL | INTRAVENOUS | Status: DC | PRN
  Administered 2022-06-23: 10 mL

## 2022-06-23 MED ORDER — CETIRIZINE HCL 10 MG/ML IV SOLN
10.0000 mg | Freq: Once | INTRAVENOUS | Status: AC
  Administered 2022-06-23: 10 mg via INTRAVENOUS
  Filled 2022-06-23: qty 1

## 2022-06-23 MED ORDER — SODIUM CHLORIDE 0.9 % IV SOLN
150.0000 mg | Freq: Once | INTRAVENOUS | Status: AC
  Administered 2022-06-23: 150 mg via INTRAVENOUS
  Filled 2022-06-23: qty 150

## 2022-06-23 MED ORDER — SODIUM CHLORIDE 0.9 % IV SOLN: Freq: Once | INTRAVENOUS | Status: AC

## 2022-06-23 MED ORDER — SODIUM CHLORIDE 0.9 % IV SOLN
535.0000 mg | Freq: Once | INTRAVENOUS | Status: AC
  Administered 2022-06-23: 540 mg via INTRAVENOUS
  Filled 2022-06-23: qty 54

## 2022-06-23 MED ORDER — FAMOTIDINE IN NACL 20-0.9 MG/50ML-% IV SOLN
20.0000 mg | Freq: Once | INTRAVENOUS | Status: AC
  Administered 2022-06-23: 20 mg via INTRAVENOUS
  Filled 2022-06-23: qty 50

## 2022-06-23 NOTE — Assessment & Plan Note (Signed)
I have reviewed recommendation from [place] and discussed this with the patient We will investigate to see if MSI testing has been done on previous biopsy, if not, that would be ordered We discussed the role of further chemotherapy, immunotherapy, timing of CT imaging and the role of adjuvant radiation treatment Ultimately, we agreed to proceed for another dose of chemotherapy beyond today's treatment for total of 3 cycles of adjuvant therapy after surgery After that, I plan to order CT imaging to establish a new baseline before we proceed with maintenance immunotherapy

## 2022-06-23 NOTE — Telephone Encounter (Signed)
Yesterday's faxed letter is grey and too dark to read.  Will resend and advise patient currently in [facility] Treatment infusion area.

## 2022-06-23 NOTE — Progress Notes (Signed)
MD used SCr = 0.7 when calculating carboplatin dose

## 2022-06-23 NOTE — Progress Notes (Signed)
Requested MMR on accession [id number] with [facility] Pathology via email.

## 2022-06-23 NOTE — Progress Notes (Signed)
[place] OFFICE PROGRESS NOTE  Patient Care Team: Lawerance Cruel, MD as PCP - General (Family Medicine)  ASSESSMENT & PLAN:  Uterine cancer [facility]) I have reviewed recommendation from [place] and discussed this with the patient We will investigate to see if MSI testing has been done on previous biopsy, if not, that would be ordered We discussed the role of further chemotherapy, immunotherapy, timing of CT imaging and the role of adjuvant radiation treatment Ultimately, we agreed to proceed for another dose of chemotherapy beyond today's treatment for total of 3 cycles of adjuvant therapy after surgery After that, I plan to order CT imaging to establish a new baseline before we proceed with maintenance immunotherapy  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, [name], I will consider modifying the dose of the treatment   Orders Placed This Encounter  Procedures   CT CHEST ABDOMEN PELVIS W CONTRAST    Standing Status:   Future    Standing Expiration Date:   06/24/2023    Order Specific Question:   Preferred imaging location?    Answer:   [facility]    Order Specific Question:   Radiology Contrast Protocol - do NOT remove file path    Answer:   [email or web address]    Order Specific Question:   Is patient pregnant?    Answer:   No   CBC with Differential (Cancer Center Only)    Standing Status:   Future    Standing Expiration Date:   07/15/2023   CMP ([place] only)    Standing Status:   Future    Standing Expiration Date:   07/15/2023   T4    Standing Status:   Future    Standing Expiration Date:   07/15/2023   TSH    Standing Status:   Future    Standing Expiration Date:   07/15/2023    All questions were answered. The patient knows to  call the clinic with any problems, questions or concerns. The total time spent in the appointment was 30 minutes encounter with patients including review of chart and various tests results, discussions about plan of care and coordination of care plan   [name], MD 06/23/2022 10:42 AM  INTERVAL HISTORY: Please see below for problem oriented charting. she returns for treatment follow-up seen prior to cycle 6 of chemo I have reviewed recommendation from 2 and we discussed this in length today She denies worsening peripheral neuropathy while on treatment She had intermittent vaginal discharge from previous visit but that has resolved Her abdominal pain/discomfort has resolved  REVIEW OF SYSTEMS:   Constitutional: Denies fevers, [name], nose, mouth, throat, [name], [name], chest discomfort or lower extremity swelling Gastrointestinal:  Denies nausea, [name], no new changes  All other systems were reviewed with the patient and are negative.  I have reviewed the past medical history, past surgical history, social history and family history with the patient and they are unchanged from previous note.  ALLERGIES:  is allergic to seasonal ic [octacosanol], chlorhexidine gluconate, and latex.  MEDICATIONS:  Current Outpatient Medications  Medication Sig Dispense Refill   Cholecalciferol (VITAMIN D3 PO) Take 1 tablet by mouth daily.     conjugated estrogens (PREMARIN) vaginal cream Place 1 Applicatorful vaginally 3 (three) times a week. Place one fingertip's worth of cream in the outer part of the vagina 3x a week at night 42.5 g 1   dexamethasone (DECADRON) 4 MG tablet Take 2  tablets the night before and 2 tablets the morning of chemotherapy, every 3 weeks, by mouth for 6 cycles 4 tablet 6   miconazole (MICATIN) 2 % cream Apply to the vulva twice daily as needed for itching 30 g 0   Multiple Vitamin (MULTIVITAMIN WITH MINERALS) TABS tablet Take 1 tablet by mouth daily.     ondansetron (ZOFRAN) 4 MG tablet Take 4 mg by mouth every 8 (eight) hours as needed for nausea or vomiting.     prochlorperazine (COMPAZINE) 10 MG tablet Take 10 mg by mouth every 6 (six) hours as needed for nausea or vomiting.     No current facility-administered medications for this visit.   Facility-Administered Medications Ordered in Other Visits  Medication Dose Route Frequency Provider Last Rate Last Admin   CARBOplatin (PARAPLATIN) 540 mg in sodium chloride 0.9 % 250 mL chemo infusion  540 mg Intravenous Once Alvy Bimler, Kady Toothaker, MD       dostarlimab-gxly (JEMPERLI) 500 mg in sodium chloride 0.9 % 100 mL (4.5455 mg/mL) chemo infusion  500 mg Intravenous Once Alvy Bimler, Adriahna Shearman, MD 220 mL/hr at 06/23/22 1038 500 mg at 06/23/22 1038   heparin lock flush 100 unit/mL  500 Units Intracatheter Once PRN [name], MD       PACLitaxel (TAXOL) 288 mg in sodium chloride 0.9 % 250 mL chemo infusion (> '80mg'$ /m2)  175 mg/m2 (Treatment Plan Recorded) Intravenous Once Yanessa Hocevar, MD       sodium chloride flush (NS) 0.9 % injection 10 mL  10 mL Intracatheter PRN Alvy Bimler, Kashtyn Jankowski, MD        SUMMARY OF ONCOLOGIC HISTORY: Oncology History Overview Note  HER2 negative, Neg Genetics   Uterine cancer ([place])  12/03/2021 Initial Diagnosis   Patient reports several month history of intermittent pelvic pain that she describes as discomfort or sometimes cramping, that has worsened with time.  She notes that this is tolerable.  She began having postmenopausal bleeding started on 6/17 with a few spots of blood.     12/29/2021 Imaging   1. Bilateral large multiloculated cystic lesions of the ovaries, concerning for ovarian malignancy.  Recommend gynecologic consultation and pelvic ultrasound and/or pelvic MRI with contrast for further evaluation. 2. Large heterogeneous mass of the posterior pelvis likely arising from the lower uterus, possibly a fibroid, although malignancy is also a concern. Recommend attention on pelvic ultrasound or MRI as above. 3. Additional mass arising from the right uterine fundus which correlates with fibroid described on prior 2015 ultrasound. 4. Nonspecific small ground-glass nodule of the right lower lobe measuring 6 mm. Recommend follow-up chest CT in 6 months for further evaluation. 5. Aortic Atherosclerosis (ICD10-I70.0).   12/30/2021 Pathology Results   A. CERVIX, BIOPSY:  -  Scantly cellular specimen with predominantly blood, fibrin and acute inflammatory infiltrate but with scattered highly atypical cells highly suspicious for malignancy.   Note: A p16 shows strong positivity in these scattered cells; however, while p53 is mildly increased definitive strong overexpression or under expression is not identified.  These findings support the suspicion for malignancy; however, are insufficient for a definitive diagnosis.  01/03/2022 Imaging   MRI pelvis  1. Large mixed solid and cystic lesions of the bilateral ovaries, measuring 8.4 x 7.0 x 5.4 cm on the right and 10.3 x 8.7 x 8.1 cm on the left, highly concerning for primary ovarian malignancy and contralateral ovarian metastasis.   2. Homogeneously enhancing mass arising from the right aspect of the uterine body and fundus measuring 6.0 x 5.5 x 4.3 cm, consistent with a uterine fibroid and similar to prior ultrasound dated 2015.   3. An additional, much more heterogeneous mass which appears to arise from the cervix or posterior lower uterine segment measuring 8.1 x 6.4 x 6.2 cm. This may reflect an additional fibroid with internal degeneration, however this was not clearly visualized on ultrasound dated 2015 and is highly suspicious for a  cervical mass as significant enlargement of benign fibroids is not expected following menopause. This mass appears to closely abut and compress or perhaps even directly involve the adjacent rectum.   4.  No evidence of lymphadenopathy in the pelvis.   01/12/2022 PET scan   1. Hypermetabolic heterogeneous mass centered on the cervix/lower uterine segment is most consistent with primary gynecologic neoplasm. 2. Multiloculated complex bilateral adnexal cystic lesions with hypermetabolic soft tissue component likely reflect metastatic lesions. 3. No hypermetabolic abdominopelvic adenopathy. 4. No evidence of hypermetabolic metastatic disease in the chest or abdomen.   01/18/2022 Pathology Results   FINAL MICROSCOPIC DIAGNOSIS:   A. CERVICAL MASS, BIOPSIES:  - Poorly differentiated carcinoma with necrosis, consistent with high-grade serous carcinoma, see comment   B. CERVICAL MASS, EXCISION:  - Poorly differentiated carcinoma with necrosis, consistent with high-grade serous carcinoma see comment  - Edges of resected tissue fragments are positive for carcinoma   COMMENT:   A and B.   The tumor cells are positive for PAX8, ER and p16 immunostains.  Immunostain for p53 shows significant overexpression. Immunostain for CK5/6 shows weak focal labeling while immunostain for  p63 is negative.  The immunoprofile is consistent with above interpretation.  The carcinoma is likely endometrial or ovarian origin with secondary involvement of the cervix.     01/23/2022 Initial Diagnosis   Uterine cancer ([place])   01/23/2022 Cancer Staging   Staging form: Corpus Uteri - Carcinoma and Carcinosarcoma, AJCC 8th Edition - Clinical stage from 01/23/2022: FIGO Stage IIIA (cT3a, cN0, cM0) - Signed by [name], MD on 01/23/2022 Stage prefix: Initial diagnosis   01/31/2022 - 01/31/2022 Chemotherapy   Patient is on Treatment Plan : UTERINE ENDOMETRIAL Dostarlimab-gxly (500 mg) + Carboplatin (AUC 5) + Paclitaxel (175 mg/m2)  q21d x 6 cycles / Dostarlimab-gxly (1000 mg) q42d x 6 cycles      01/31/2022 -  Chemotherapy   Patient is on Treatment Plan : UTERINE ENDOMETRIAL Dostarlimab-gxly (500 mg) + Carboplatin (AUC 5) + Paclitaxel (175 mg/m2) q21d x 6 cycles / Dostarlimab-gxly (1000 mg) q42d x 6 cycles      04/14/2022 PET scan   1. Significant interval positive response to therapy. Mildly hypermetabolic solid 5.5 x 2.8 cm posterior uterine cervix mass and mildly hypermetabolic bilateral adnexal mixed cystic and solid masses are all significantly decreased in size and metabolism. 2. No new or progressive hypermetabolic metastatic disease. 3. Low level FDG uptake associated with healing subacute sclerotic nondisplaced anterior left third, fourth and fifth rib fractures without associated discrete osseous lesions, correlate for history of interval injury in this location. 4. Horseshoe kidney.   05/03/2022 Surgery   Robotic-assisted laparoscopic total hysterectomy with bilateral salpingo-oophorectomy, tumor  debulking including infracolic omentectomy, mini-lap, cystoscopy, and vaginal laceration repair   Findings:  On speculum exam, normal-appearing cervix that is flush with the vaginal apex.  No visible cervical lesion.  On EUA, moderately mobile, enlarged uterus with mobile fullness appreciated in the cul-de-sac.  On intra-abdominal entry, normal upper abdominal survey.  Some adhesions of the omentum to the anterior abdominal wall below the umbilicus were noted.  Normal-appearing omentum, small and large bowel.  No ascites.  Right ovary minimally enlarged but overall normal in appearance.  Left ovary replaced by a 6 cm cystic mass, smooth.  Normal-appearing bilateral fallopian tubes.  Bladder was somewhat adherent anteriorly to the cervix.  Uterus approximately 8 cm and deviated to the left given a 10 cm anterior and right-sided fibroid.  The cervix itself did not appear enlarged, either on intra-abdominal phonation or speculum  exam.  Posteriorly, a white ridge of somewhat indurated tissue was noted underneath the cervix and just above the rectum.  On palpation, this ridge could be appreciated only on rectovaginal exam.  It was separate and free from the cervix itself.  It was difficult to distinguish whether this was treated tumor or active tumor although its detached location from the cervix and uterus was noted.  Given my conversation with the patient prior to surgery and that she would not want an ostomy (even temporarily), there was no way to resect this area out without significant risk of damage to the rectum.  Given its location, even in the setting of a bowel prep (which she had not had), she would likely require at least a protective diverting ostomy. Cystoscopy, bladder dome noted to be intact and good efflux seen from bilateral ureteral orifices.   05/03/2022 Pathology Results   A. UTERUS, CERVIX, BILATERAL TUBES AND OVARIES, RESECTION:  - Cervix      Nabothian cysts      Negative for malignancy  - Endometrium      Inactive endometrium      Two benign endometrial polyps      Negative for malignancy  - Myometrium      Leiomyoma      Negative for malignancy  - Right ovary      Hemorrhagic cyst with fibrosis, hemosiderin and focal calcification      Negative for malignancy  - Left ovary      Hemorrhagic cyst with fibrosis, hemosiderin and focal calcification      Negative for malignancy  - Bilateral fallopian tubes      Unremarkable      Negative for malignancy  - See oncology table   B. OMENTUM, EXCISION:  - Benign omental adipose tissue.  - Negative for malignancy.     Genetic Testing   Negative genetic testing. No pathogenic variants identified on the [facility] CancerNext-Expanded+RNA panel. [facility] in [place] called [id number] identified. The report date is 05/03/2022.  The CancerNext-Expanded + RNAinsight gene panel offered by [employer] and includes sequencing and rearrangement analysis for the  following 77 genes: IP, ALK, APC*, ATM*, AXIN2, BAP1, BARD1, BLM, BMPR1A, BRCA1*, BRCA2*, BRIP1*, CDC73, CDH1*,CDK4, CDKN1B, CDKN2A, CHEK2*, CTNNA1, DICER1, FANCC, FH, FLCN, GALNT12, KIF1B, LZTR1, MAX, MEN1, MET, MLH1*, MSH2*, MSH3, MSH6*, MUTYH*, NBN, NF1*, NF2, NTHL1, PALB2*, PHOX2B, PMS2*, POT1, PRKAR1A, PTCH1, PTEN*, RAD51C*, RAD51D*,RB1, RECQL, RET, SDHA, SDHAF2, SDHB, SDHC, SDHD, SMAD4, SMARCA4, SMARCB1, SMARCE1, STK11, SUFU, TMEM127, TP53*,TSC1, TSC2, VHL and XRCC2 (sequencing and deletion/duplication); EGFR, EGLN1, HOXB13, KIT, MITF, PDGFRA, POLD1 and POLE (sequencing only); EPCAM and GREM1 (deletion/duplication only).  PHYSICAL EXAMINATION: ECOG PERFORMANCE STATUS: 1 - Symptomatic but completely ambulatory  Vitals:   06/23/22 0914  BP: (!) 169/94  Pulse: (!) 111  Resp: 18  Temp: 98.8 F (37.1 C)  SpO2: 100%   Filed Weights   06/23/22 0914  Weight: 135 lb 3.2 oz (61.3 kg)    GENERAL:alert, no distress and comfortable NEURO: alert & oriented x 3 with fluent speech, no focal motor/sensory deficits  LABORATORY DATA:  I have reviewed the data as listed    Component Value Date/Time   NA 140 06/23/2022 0851   K 3.9 06/23/2022 0851   CL 106 06/23/2022 0851   CO2 23 06/23/2022 0851   GLUCOSE 178 (H) 06/23/2022 0851   BUN 15 06/23/2022 0851   CREATININE 0.65 06/23/2022 0851   CALCIUM 10.0 06/23/2022 0851   PROT 7.1 06/23/2022 0851   ALBUMIN 4.7 06/23/2022 0851   AST 19 06/23/2022 0851   ALT 29 06/23/2022 0851   ALKPHOS 81 06/23/2022 0851   BILITOT 0.3 06/23/2022 0851   GFRNONAA >60 06/23/2022 0851    No results found for: "SPEP", "UPEP"  Lab Results  Component Value Date   WBC 9.2 06/23/2022   NEUTROABS 7.5 06/23/2022   HGB 12.6 06/23/2022   HCT 36.9 06/23/2022   MCV 73.8 (L) 06/23/2022   PLT 168 06/23/2022      Chemistry      Component Value Date/Time   NA 140 06/23/2022 0851   K 3.9 06/23/2022 0851   CL 106 06/23/2022 0851   CO2 23 06/23/2022 0851    BUN 15 06/23/2022 0851   CREATININE 0.65 06/23/2022 0851      Component Value Date/Time   CALCIUM 10.0 06/23/2022 0851   ALKPHOS 81 06/23/2022 0851   AST 19 06/23/2022 0851   ALT 29 06/23/2022 0851   BILITOT 0.3 06/23/2022 0851       RADIOGRAPHIC STUDIES: I have personally reviewed the radiological images as listed and agreed with the findings in the report. MR Pelvis W Wo Contrast  Addendum Date: 06/01/2022   ADDENDUM REPORT: 06/01/2022 12:12 ADDENDUM: Addendum is made to note presence of a small area of T2 hyperintensity and rim enhancement in the left pelvic sidewall adjacent to the left external iliac vein (series 5, image 14, series 19, image 30). This measures approximately 1.8 x 1.1 cm. By report, there was no pelvic lymph node dissection intraoperatively and there was no evidence of lymphadenopathy or soft tissue metastatic disease in this vicinity on prior staging imaging, and this is of uncertain nature, potentially reflecting a necrotic lymph node developed in the interval to latest preoperative staging imaging dated 04/14/2022 or alternately postoperative hematoma/seroma. Attention on follow-up. These findings were discussed with Dr. Berline Lopes by Dr. Jacalyn Lefevre, 06/01/2022. Electronically Signed   By: Delanna Ahmadi M.D.   On: 06/01/2022 12:12   Result Date: 06/01/2022 CLINICAL DATA:  Serous carcinoma, status post recent hysterectomy, history of prolapsing mass EXAM: MRI PELVIS WITHOUT AND WITH CONTRAST TECHNIQUE: Multiplanar multisequence MR imaging of the pelvis was performed both before and after administration of intravenous contrast. CONTRAST:  61m GADAVIST GADOBUTROL 1 MMOL/ML IV SOLN COMPARISON:  PET-CT, 04/14/2022, MR pelvis, 01/03/2022 FINDINGS: Urinary Tract:  No abnormality visualized. Bowel:  Unremarkable visualized pelvic bowel loops. Vascular/Lymphatic: No pathologically enlarged lymph nodes. No significant vascular abnormality seen. Reproductive: Status post interval  hysterectomy and bilateral oophorectomy. Other:  None. Musculoskeletal: No suspicious bone lesions identified. IMPRESSION: Status post interval hysterectomy and bilateral oophorectomy. No evidence of recurrent  or metastatic disease in the pelvis. Electronically Signed: By: Delanna Ahmadi M.D. On: 05/31/2022 16:49

## 2022-06-23 NOTE — Progress Notes (Signed)
[place] CSW Progress Note  Holiday representative met with patient in infusion for ongoing support. Her son's father was present with her today. Pt reports that she is overall doing fair. She is looking forward to just one more chemo treatment after today and then immunotherapy. She met with a doctor from [facility] and was disheartened to hear that there is a chance of recurrence. Briefly discussed this today and discussed ways to help process and support her through this fear, including counseling, FYNN, and support group. Pt may pursue these in the future. Pt also had forms for FMLA team- CSW made copies and sent to [place] today.    Uri Covey E Nabilah Davoli, LCSW

## 2022-06-23 NOTE — Telephone Encounter (Signed)
Provided (SW) H.I.M. phone: [phone] opt 2 to [facility] in infusion room before discharge.  Advised to call, ask if referring provider, GYN Oncology and Medical Oncology records will be sent.  Second step if not is to contact these offices to send. Currently no further questions or needs.   Connected with GYN Oncology staff.  A Fax confirmation of 47-pages of records sent to [facility] on 06/14/2022 received however Birdella Blincoe has left [place].  Fax not resubmitted.  Patient and (SW) H.I.M. to complete process.

## 2022-06-23 NOTE — Assessment & Plan Note (Signed)
she has mild peripheral neuropathy, [name], [name], I will consider modifying the dose of the treatment  

## 2022-06-24 LAB — T4: T4, Total: 8.8 ug/dL (ref 4.5–12.0)

## 2022-06-25 ENCOUNTER — Other Ambulatory Visit: Payer: Self-pay

## 2022-06-26 ENCOUNTER — Other Ambulatory Visit (HOSPITAL_COMMUNITY): Payer: Self-pay

## 2022-06-29 ENCOUNTER — Other Ambulatory Visit: Payer: Self-pay

## 2022-06-30 ENCOUNTER — Other Ambulatory Visit: Payer: Self-pay

## 2022-07-03 LAB — SURGICAL PATHOLOGY

## 2022-07-07 ENCOUNTER — Other Ambulatory Visit (HOSPITAL_COMMUNITY): Payer: Self-pay

## 2022-07-07 ENCOUNTER — Encounter: Payer: Self-pay | Admitting: Hematology and Oncology

## 2022-07-07 ENCOUNTER — Other Ambulatory Visit: Payer: Self-pay

## 2022-07-11 ENCOUNTER — Telehealth: Payer: Self-pay

## 2022-07-11 NOTE — Telephone Encounter (Signed)
Tiffany [name] called office with the concern of having pain and discharge. She states pain is her usual pain since her hysterectomy (05/03/22) which at some point went away but she has noticed it, along with vaginal discharge upon wiping, since she started chemo 3 weeks ago. Pain is 5/10 on pain scale but she isn't having to take any medication to relieve it. She states the pain is not in her genital area but right above it when she presses. No vaginal itching or irritation noted, and no swelling.The discharge is light pink and has no odor, no fever/chills.No UTI symptoms (pain/pressure/burning/urgency). She states she did lift her 59 month old grandson last week, but no extra heavy lifting/pulling/pushing and this all started before then. She states her next chemo appointment is Friday 1/26 and she thinks this is her last one.  Joylene John NP, and Dr. Berline Lopes notified

## 2022-07-12 NOTE — Telephone Encounter (Signed)
Please let the patient know that I am out of town until next week. Melissa and I discussed offering her an appt with Melissa this week. Without being able to do an exam, its really hard for me to determine what is causing her pain and spotting. I am happy to discuss getting her back on the schedule for an exam under anesthesia.

## 2022-07-13 ENCOUNTER — Encounter: Payer: Self-pay | Admitting: Hematology and Oncology

## 2022-07-13 MED FILL — Dexamethasone Sodium Phosphate Inj 100 MG/10ML: INTRAMUSCULAR | Qty: 1 | Status: AC

## 2022-07-13 MED FILL — Fosaprepitant Dimeglumine For IV Infusion 150 MG (Base Eq): INTRAVENOUS | Qty: 5 | Status: AC

## 2022-07-13 NOTE — Telephone Encounter (Signed)
Call to patient. Advised of option from Dr Berline Lopes to proceed with EUA.  Patient is adamant she does not want to go to hospital and/or have anesthesia for exam.  Wants to try alternative option of Estradiol Cream for one week and then office visit for exam here.   Will this option work for you?

## 2022-07-14 ENCOUNTER — Inpatient Hospital Stay: Payer: 59

## 2022-07-14 ENCOUNTER — Inpatient Hospital Stay: Payer: 59 | Admitting: Licensed Clinical Social Worker

## 2022-07-14 ENCOUNTER — Inpatient Hospital Stay (HOSPITAL_BASED_OUTPATIENT_CLINIC_OR_DEPARTMENT_OTHER): Payer: 59 | Admitting: Hematology and Oncology

## 2022-07-14 ENCOUNTER — Other Ambulatory Visit: Payer: Self-pay | Admitting: Gynecologic Oncology

## 2022-07-14 ENCOUNTER — Other Ambulatory Visit (HOSPITAL_BASED_OUTPATIENT_CLINIC_OR_DEPARTMENT_OTHER): Payer: Self-pay

## 2022-07-14 ENCOUNTER — Encounter: Payer: Self-pay | Admitting: Hematology and Oncology

## 2022-07-14 ENCOUNTER — Telehealth: Payer: Self-pay | Admitting: Hematology and Oncology

## 2022-07-14 ENCOUNTER — Other Ambulatory Visit (HOSPITAL_COMMUNITY): Payer: Self-pay

## 2022-07-14 VITALS — BP 147/87 | HR 102 | Temp 97.7°F | Resp 20

## 2022-07-14 VITALS — BP 162/96 | HR 118 | Temp 98.8°F | Resp 18 | Ht 63.0 in | Wt 135.2 lb

## 2022-07-14 DIAGNOSIS — C539 Malignant neoplasm of cervix uteri, unspecified: Secondary | ICD-10-CM

## 2022-07-14 DIAGNOSIS — D696 Thrombocytopenia, unspecified: Secondary | ICD-10-CM

## 2022-07-14 DIAGNOSIS — G620 Drug-induced polyneuropathy: Secondary | ICD-10-CM

## 2022-07-14 DIAGNOSIS — T451X5A Adverse effect of antineoplastic and immunosuppressive drugs, initial encounter: Secondary | ICD-10-CM | POA: Diagnosis not present

## 2022-07-14 DIAGNOSIS — Z5112 Encounter for antineoplastic immunotherapy: Secondary | ICD-10-CM | POA: Diagnosis not present

## 2022-07-14 DIAGNOSIS — F418 Other specified anxiety disorders: Secondary | ICD-10-CM

## 2022-07-14 LAB — CMP (CANCER CENTER ONLY)
ALT: 28 U/L (ref 0–44)
AST: 21 U/L (ref 15–41)
Albumin: 4.5 g/dL (ref 3.5–5.0)
Alkaline Phosphatase: 87 U/L (ref 38–126)
Anion gap: 10 (ref 5–15)
BUN: 17 mg/dL (ref 6–20)
CO2: 24 mmol/L (ref 22–32)
Calcium: 9.8 mg/dL (ref 8.9–10.3)
Chloride: 105 mmol/L (ref 98–111)
Creatinine: 0.65 mg/dL (ref 0.44–1.00)
GFR, Estimated: >60 mL/min (ref >60)
Glucose, Bld: 162 mg/dL — ABNORMAL HIGH (ref 70–99)
Potassium: 3.8 mmol/L (ref 3.5–5.1)
Sodium: 139 mmol/L (ref 135–145)
Total Bilirubin: 0.3 mg/dL (ref 0.3–1.2)
Total Protein: 7.7 g/dL (ref 6.5–8.1)

## 2022-07-14 LAB — CBC WITH DIFFERENTIAL (CANCER CENTER ONLY)
Abs Immature Granulocytes: 0.05 10*3/uL (ref 0.00–0.07)
Basophils Absolute: 0 10*3/uL (ref 0.0–0.1)
Basophils Relative: 0 %
Eosinophils Absolute: 0 10*3/uL (ref 0.0–0.5)
Eosinophils Relative: 0 %
HCT: 36.2 % (ref 36.0–46.0)
Hemoglobin: 12.3 g/dL (ref 12.0–15.0)
Immature Granulocytes: 1 %
Lymphocytes Relative: 13 %
Lymphs Abs: 1.4 10*3/uL (ref 0.7–4.0)
MCH: 25.3 pg — ABNORMAL LOW (ref 26.0–34.0)
MCHC: 34 g/dL (ref 30.0–36.0)
MCV: 74.5 fL — ABNORMAL LOW (ref 80.0–100.0)
Monocytes Absolute: 0.2 10*3/uL (ref 0.1–1.0)
Monocytes Relative: 2 %
Neutro Abs: 9.3 10*3/uL — ABNORMAL HIGH (ref 1.7–7.7)
Neutrophils Relative %: 84 %
Platelet Count: 137 10*3/uL — ABNORMAL LOW (ref 150–400)
RBC: 4.86 MIL/uL (ref 3.87–5.11)
RDW: 14.6 % (ref 11.5–15.5)
WBC Count: 11 10*3/uL — ABNORMAL HIGH (ref 4.0–10.5)
nRBC: 0 % (ref 0.0–0.2)

## 2022-07-14 LAB — TSH: TSH: 1.266 u[IU]/mL (ref 0.350–4.500)

## 2022-07-14 MED ORDER — PALONOSETRON HCL INJECTION 0.25 MG/5ML
0.2500 mg | Freq: Once | INTRAVENOUS | Status: AC
  Administered 2022-07-14: 0.25 mg via INTRAVENOUS
  Filled 2022-07-14: qty 5

## 2022-07-14 MED ORDER — SODIUM CHLORIDE 0.9 % IV SOLN
10.0000 mg | Freq: Once | INTRAVENOUS | Status: AC
  Administered 2022-07-14: 10 mg via INTRAVENOUS
  Filled 2022-07-14: qty 10

## 2022-07-14 MED ORDER — HEPARIN SOD (PORK) LOCK FLUSH 100 UNIT/ML IV SOLN
500.0000 [IU] | Freq: Once | INTRAVENOUS | Status: AC | PRN
  Administered 2022-07-14: 500 [IU]

## 2022-07-14 MED ORDER — FAMOTIDINE IN NACL 20-0.9 MG/50ML-% IV SOLN
20.0000 mg | Freq: Once | INTRAVENOUS | Status: AC
  Administered 2022-07-14: 20 mg via INTRAVENOUS
  Filled 2022-07-14: qty 50

## 2022-07-14 MED ORDER — SODIUM CHLORIDE 0.9 % IV SOLN
540.0000 mg | Freq: Once | INTRAVENOUS | Status: AC
  Administered 2022-07-14: 550 mg via INTRAVENOUS
  Filled 2022-07-14: qty 55

## 2022-07-14 MED ORDER — SODIUM CHLORIDE 0.9 % IV SOLN
150.0000 mg | Freq: Once | INTRAVENOUS | Status: AC
  Administered 2022-07-14: 150 mg via INTRAVENOUS
  Filled 2022-07-14: qty 150

## 2022-07-14 MED ORDER — SODIUM CHLORIDE 0.9 % IV SOLN
500.0000 mg | Freq: Once | INTRAVENOUS | Status: AC
  Administered 2022-07-14: 500 mg via INTRAVENOUS
  Filled 2022-07-14: qty 10

## 2022-07-14 MED ORDER — SODIUM CHLORIDE 0.9% FLUSH
10.0000 mL | Freq: Once | INTRAVENOUS | Status: AC
  Administered 2022-07-14: 10 mL

## 2022-07-14 MED ORDER — CETIRIZINE HCL 10 MG/ML IV SOLN
10.0000 mg | Freq: Once | INTRAVENOUS | Status: AC
  Administered 2022-07-14: 10 mg via INTRAVENOUS
  Filled 2022-07-14: qty 1

## 2022-07-14 MED ORDER — SODIUM CHLORIDE 0.9 % IV SOLN: Freq: Once | INTRAVENOUS | Status: AC

## 2022-07-14 MED ORDER — SODIUM CHLORIDE 0.9 % IV SOLN
175.0000 mg/m2 | Freq: Once | INTRAVENOUS | Status: AC
  Administered 2022-07-14: 288 mg via INTRAVENOUS
  Filled 2022-07-14: qty 48

## 2022-07-14 MED ORDER — SODIUM CHLORIDE 0.9% FLUSH
10.0000 mL | INTRAVENOUS | Status: DC | PRN
  Administered 2022-07-14: 10 mL

## 2022-07-14 MED ORDER — LORAZEPAM 0.5 MG PO TABS
0.5000 mg | ORAL_TABLET | Freq: Once | ORAL | 0 refills | Status: AC
  Filled 2022-07-14 – 2022-08-07 (×2): qty 1, 1d supply, fill #0

## 2022-07-14 NOTE — Telephone Encounter (Signed)
Call to patient. Advised of in-office appointment with Dr Berline Lopes on 08-10-22 at 0830. States she saw Dr Alvy Bimler this morning and have coordinated next appointment on same day with Dr Berline Lopes. PET scan also ordered and planned for week of 08-07-22 prior to appointments that week. Patient is agreeable to medication to help her relax for exam. Advised will need driver to and from appointment. Confirmed [facility] pharm. Voiced understanding.

## 2022-07-14 NOTE — Patient Instructions (Signed)
[facility]  Discharge Instructions: Thank you for choosing [facility] to provide your oncology and hematology care.   If you have a lab appointment with the Cancer Center, please go directly to the Cancer Center and check in at the registration area.   Wear comfortable clothing and clothing appropriate for easy access to any Portacath or PICC line.   We strive to give you quality time with your provider. You may need to reschedule your appointment if you arrive late (15 or more minutes).  Arriving late affects you and other patients whose appointments are after yours.  Also, if you miss three or more appointments without notifying the office, you may be dismissed from the clinic at the provider's discretion.      For prescription refill requests, have your pharmacy contact our office and allow 72 hours for refills to be completed.    Today you received the following chemotherapy and/or immunotherapy agents: Dostarlimab (Jempreli), Paclitaxel (Taxol), and Carboplatin.   To help prevent nausea and vomiting after your treatment, we encourage you to take your nausea medication as directed.  BELOW ARE SYMPTOMS THAT SHOULD BE REPORTED IMMEDIATELY: *FEVER GREATER THAN 100.4 F (38 C) OR HIGHER *CHILLS OR SWEATING *NAUSEA AND VOMITING THAT IS NOT CONTROLLED WITH YOUR NAUSEA MEDICATION *UNUSUAL SHORTNESS OF BREATH *UNUSUAL BRUISING OR BLEEDING *URINARY PROBLEMS (pain or burning when urinating, or frequent urination) *BOWEL PROBLEMS (unusual diarrhea, constipation, pain near the anus) TENDERNESS IN MOUTH AND THROAT WITH OR WITHOUT PRESENCE OF ULCERS (sore throat, sores in mouth, or a toothache) UNUSUAL RASH, SWELLING OR PAIN  UNUSUAL VAGINAL DISCHARGE OR ITCHING   Items with * indicate a potential emergency and should be followed up as soon as possible or go to the Emergency Department if any problems should occur.  Please show the CHEMOTHERAPY  ALERT CARD or IMMUNOTHERAPY ALERT CARD at check-in to the Emergency Department and triage nurse.  Should you have questions after your visit or need to cancel or reschedule your appointment, please contact [facility]  Dept: [phone]  and follow the prompts.  Office hours are 8:00 a.m. to 4:30 p.m. Monday - Friday. Please note that voicemails left after 4:00 p.m. may not be returned until the following business day.  We are closed weekends and major holidays. You have access to a nurse at all times for urgent questions. Please call the main number to the clinic Dept: [phone] and follow the prompts.   For any non-urgent questions, you may also contact your provider using MyChart. We now offer e-Visits for anyone 18 and older to request care online for non-urgent symptoms. For details visit [email or web address].   Also download the MyChart app! Go to the app store, search "MyChart", open the app, select [facility], and log in with your MyChart username and password.  

## 2022-07-14 NOTE — Progress Notes (Signed)
[facility] CSW Progress Note  Holiday representative met with patient to provide support during last infusion treatment. Congratulated pt on finishing chemo today. Pt is relieved and hopes to start feeling better in the next 7-10 days. She will have scans in February and do immunotherapy but is looking forward to her hair growing back and eventually travelling again.  Pt's long-term disability was approved. Pt had questions about hardship policy re: hospital bills- CSW gave information to my knowledge and encouraged pt to contact billing dept.  Reviewed available options for counseling and emotional support and signed up pt for [facility] class in March.   Follow-up: pt will contact CSW when ready to schedule a counseling appointment   Kapil Petropoulos E Arianne Klinge, LCSW

## 2022-07-14 NOTE — Progress Notes (Signed)
Per Dr. Alvy Bimler, ok for treatment today with elevated heart rate. Pt. denies chest pain, dizziness, no shortness of breath noted.

## 2022-07-14 NOTE — Telephone Encounter (Signed)
Scheduled appointments per WQ. Patient is aware.

## 2022-07-15 ENCOUNTER — Encounter: Payer: Self-pay | Admitting: Hematology and Oncology

## 2022-07-15 DIAGNOSIS — D696 Thrombocytopenia, unspecified: Secondary | ICD-10-CM | POA: Insufficient documentation

## 2022-07-15 NOTE — Assessment & Plan Note (Addendum)
she has mild peripheral neuropathy, [name], not bothering the patient. I will observe for now

## 2022-07-15 NOTE — Assessment & Plan Note (Signed)
I have reviewed recent molecular testing with the patient Even though the MMR is normal and it is not likely she has MSI high disease, she could still benefit from immunotherapy We discussed the risk and benefits of immunotherapy in the future We discussed rationale behind repeating another PET/CT imaging next month at the conclusion of her chemotherapy She has a lot of questions and attempt to answer them to the best of my ability

## 2022-07-15 NOTE — Progress Notes (Signed)
[name] PROGRESS NOTE  Patient Care Team: Lawerance Cruel, MD as PCP - General (Family Medicine)  ASSESSMENT & PLAN:  Uterine cancer [facility]) I have reviewed recent molecular testing with the patient Even though the MMR is normal and it is not likely she has MSI high disease, she could still benefit from immunotherapy We discussed the risk and benefits of immunotherapy in the future We discussed rationale behind repeating another PET/CT imaging next month at the conclusion of her chemotherapy She has a lot of questions and attempt to answer them to the best of my ability  Thrombocytopenia [facility]) This is related to her treatment She is not symptomatic We will proceed with treatment without delay  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, not bothering the patient. I will observe for now  Orders Placed This Encounter  Procedures   NM PET Image Restage (PS) Skull Base to Thigh (F-18 FDG)    Standing Status:   Future    Standing Expiration Date:   07/15/2023    Order Specific Question:   If indicated for the ordered procedure, I authorize the administration of a radiopharmaceutical per Radiology protocol    Answer:   Yes    Order Specific Question:   Preferred imaging location?    Answer:   [facility]    Order Specific Question:   Radiology Contrast Protocol - do NOT remove file path    Answer:   [email or web address]    Order Specific Question:   Is the patient pregnant?    Answer:   No    All questions were answered. The patient knows to call the clinic with any problems, questions or concerns. The total time spent in the appointment was 30 minutes encounter with patients including review of chart and various tests results, discussions about plan of care and coordination of care plan   [name],  MD 07/15/2022 10:07 AM  INTERVAL HISTORY: Please see below for problem oriented charting. she returns for treatment follow-up seen prior to final treatment She tolerated last cycle of treatment well I gave her a copy of her recent molecular study and spent majority of our time reviewing test results and discussed next step  REVIEW OF SYSTEMS:   Constitutional: Denies fevers, [name], nose, mouth, throat, [name], [name], chest discomfort or lower extremity swelling Gastrointestinal:  Denies nausea, heartburn or change in bowel habits Skin: Denies abnormal skin rashes Lymphatics: Denies new lymphadenopathy or easy bruising  Behavioral/Psych: Mood is stable, no new changes  All other systems were reviewed with the patient and are negative.  I have reviewed the past medical history, past surgical history, social history and family history with the patient and they are unchanged from previous note.  ALLERGIES:  is allergic to seasonal ic [octacosanol], chlorhexidine gluconate, and latex.  MEDICATIONS:  Current Outpatient Medications  Medication Sig Dispense Refill   Cholecalciferol (VITAMIN D3 PO) Take 1 tablet by mouth daily.     conjugated estrogens (PREMARIN) vaginal cream Place 1 Applicatorful vaginally 3 (three) times a week. Place one fingertip's worth of cream in the outer part of the vagina 3x a week at night 42.5 g 1   dexamethasone (DECADRON) 4 MG tablet Take 2 tablets by mouth the night before  and 2 tablets the morning of chemotherapy, every 3 weeks, for 6 cycles 4 tablet 6   [START ON 08/10/2022] LORazepam (ATIVAN) 0.5 MG tablet Take 1 tablet by mouth once for 1 dose. Take 30 minutes prior to appointment, have someone drive you to and from the appointment 1 tablet 0   Multiple Vitamin (MULTIVITAMIN WITH  MINERALS) TABS tablet Take 1 tablet by mouth daily.     ondansetron (ZOFRAN) 4 MG tablet Take 4 mg by mouth every 8 (eight) hours as needed for nausea or vomiting.     prochlorperazine (COMPAZINE) 10 MG tablet Take 10 mg by mouth every 6 (six) hours as needed for nausea or vomiting.     No current facility-administered medications for this visit.    SUMMARY OF ONCOLOGIC HISTORY: Oncology History Overview Note  HER2 negative, Neg Genetics MMR Normal   Uterine cancer ([place])  12/03/2021 Initial Diagnosis   Patient reports several month history of intermittent pelvic pain that she describes as discomfort or sometimes cramping, that has worsened with time.  She notes that this is tolerable.  She began having postmenopausal bleeding started on 6/17 with a few spots of blood.     12/29/2021 Imaging   1. Bilateral large multiloculated cystic lesions of the ovaries, concerning for ovarian malignancy. Recommend gynecologic consultation and pelvic ultrasound and/or pelvic MRI with contrast for further evaluation. 2. Large heterogeneous mass of the posterior pelvis likely arising from the lower uterus, possibly a fibroid, although malignancy is also a concern. Recommend attention on pelvic ultrasound or MRI as above. 3. Additional mass arising from the right uterine fundus which correlates with fibroid described on prior 2015 ultrasound. 4. Nonspecific small ground-glass nodule of the right lower lobe measuring 6 mm. Recommend follow-up chest CT in 6 months for further evaluation. 5. Aortic Atherosclerosis (ICD10-I70.0).   12/30/2021 Pathology Results   A. CERVIX, BIOPSY:  -  Scantly cellular specimen with predominantly blood, fibrin and acute inflammatory infiltrate but with scattered highly atypical cells highly suspicious for malignancy.   Note: A p16 shows strong positivity in these scattered cells; however, while p53 is mildly increased definitive strong overexpression or under expression is not  identified.  These findings support the suspicion for malignancy; however, are insufficient for a definitive diagnosis.     01/03/2022 Imaging   MRI pelvis  1. Large mixed solid and cystic lesions of the bilateral ovaries, measuring 8.4 x 7.0 x 5.4 cm on the right and 10.3 x 8.7 x 8.1 cm on the left, highly concerning for primary ovarian malignancy and contralateral ovarian metastasis.   2. Homogeneously enhancing mass arising from the right aspect of the uterine body and fundus measuring 6.0 x 5.5 x 4.3 cm, consistent with a uterine fibroid and similar to prior ultrasound dated 2015.   3. An additional, much more heterogeneous mass which appears to arise from the cervix or posterior lower uterine segment measuring 8.1 x 6.4 x 6.2 cm. This may reflect an additional fibroid with internal degeneration, however this was not clearly visualized on ultrasound dated 2015 and is highly suspicious for a cervical mass as significant enlargement of benign fibroids is not expected following menopause. This mass appears to closely abut and compress or perhaps even directly involve the adjacent rectum.   4.  No evidence of lymphadenopathy in the pelvis.   01/12/2022 PET scan   1. Hypermetabolic heterogeneous mass centered on the cervix/lower uterine segment is most consistent with primary gynecologic neoplasm. 2. Multiloculated complex bilateral adnexal  cystic lesions with hypermetabolic soft tissue component likely reflect metastatic lesions. 3. No hypermetabolic abdominopelvic adenopathy. 4. No evidence of hypermetabolic metastatic disease in the chest or abdomen.   01/18/2022 Pathology Results   FINAL MICROSCOPIC DIAGNOSIS:   A. CERVICAL MASS, BIOPSIES:  - Poorly differentiated carcinoma with necrosis, consistent with high-grade serous carcinoma, see comment   B. CERVICAL MASS, EXCISION:  - Poorly differentiated carcinoma with necrosis, consistent with high-grade serous carcinoma see comment  - Edges of  resected tissue fragments are positive for carcinoma   COMMENT:   A and B.   The tumor cells are positive for PAX8, ER and p16 immunostains.  Immunostain for p53 shows significant overexpression. Immunostain for CK5/6 shows weak focal labeling while immunostain for  p63 is negative.  The immunoprofile is consistent with above interpretation.  The carcinoma is likely endometrial or ovarian origin with secondary involvement of the cervix.     01/23/2022 Initial Diagnosis   Uterine cancer ([place])   01/23/2022 Cancer Staging   Staging form: Corpus Uteri - Carcinoma and Carcinosarcoma, AJCC 8th Edition - Clinical stage from 01/23/2022: FIGO Stage IIIA (cT3a, cN0, cM0) - Signed by [name], MD on 01/23/2022 Stage prefix: Initial diagnosis   01/31/2022 - 01/31/2022 Chemotherapy   Patient is on Treatment Plan : UTERINE ENDOMETRIAL Dostarlimab-gxly (500 mg) + Carboplatin (AUC 5) + Paclitaxel (175 mg/m2) q21d x 6 cycles / Dostarlimab-gxly (1000 mg) q42d x 6 cycles      01/31/2022 -  Chemotherapy   Patient is on Treatment Plan : UTERINE ENDOMETRIAL Dostarlimab-gxly (500 mg) + Carboplatin (AUC 5) + Paclitaxel (175 mg/m2) q21d x 6 cycles / Dostarlimab-gxly (1000 mg) q42d x 6 cycles      04/14/2022 PET scan   1. Significant interval positive response to therapy. Mildly hypermetabolic solid 5.5 x 2.8 cm posterior uterine cervix mass and mildly hypermetabolic bilateral adnexal mixed cystic and solid masses are all significantly decreased in size and metabolism. 2. No new or progressive hypermetabolic metastatic disease. 3. Low level FDG uptake associated with healing subacute sclerotic nondisplaced anterior left third, fourth and fifth rib fractures without associated discrete osseous lesions, correlate for history of interval injury in this location. 4. Horseshoe kidney.   05/03/2022 Surgery   Robotic-assisted laparoscopic total hysterectomy with bilateral salpingo-oophorectomy, tumor debulking including  infracolic omentectomy, mini-lap, cystoscopy, and vaginal laceration repair   Findings:  On speculum exam, normal-appearing cervix that is flush with the vaginal apex.  No visible cervical lesion.  On EUA, moderately mobile, enlarged uterus with mobile fullness appreciated in the cul-de-sac.  On intra-abdominal entry, normal upper abdominal survey.  Some adhesions of the omentum to the anterior abdominal wall below the umbilicus were noted.  Normal-appearing omentum, small and large bowel.  No ascites.  Right ovary minimally enlarged but overall normal in appearance.  Left ovary replaced by a 6 cm cystic mass, smooth.  Normal-appearing bilateral fallopian tubes.  Bladder was somewhat adherent anteriorly to the cervix.  Uterus approximately 8 cm and deviated to the left given a 10 cm anterior and right-sided fibroid.  The cervix itself did not appear enlarged, either on intra-abdominal phonation or speculum exam.  Posteriorly, a white ridge of somewhat indurated tissue was noted underneath the cervix and just above the rectum.  On palpation, this ridge could be appreciated only on rectovaginal exam.  It was separate and free from the cervix itself.  It was difficult to distinguish whether this was treated tumor or active tumor although its detached location  from the cervix and uterus was noted.  Given my conversation with the patient prior to surgery and that she would not want an ostomy (even temporarily), there was no way to resect this area out without significant risk of damage to the rectum.  Given its location, even in the setting of a bowel prep (which she had not had), she would likely require at least a protective diverting ostomy. Cystoscopy, bladder dome noted to be intact and good efflux seen from bilateral ureteral orifices.   05/03/2022 Pathology Results   A. UTERUS, CERVIX, BILATERAL TUBES AND OVARIES, RESECTION:  - Cervix      Nabothian cysts      Negative for malignancy  - Endometrium       Inactive endometrium      Two benign endometrial polyps      Negative for malignancy  - Myometrium      Leiomyoma      Negative for malignancy  - Right ovary      Hemorrhagic cyst with fibrosis, hemosiderin and focal calcification      Negative for malignancy  - Left ovary      Hemorrhagic cyst with fibrosis, hemosiderin and focal calcification      Negative for malignancy  - Bilateral fallopian tubes      Unremarkable      Negative for malignancy  - See oncology table   B. OMENTUM, EXCISION:  - Benign omental adipose tissue.  - Negative for malignancy.     Genetic Testing   Negative genetic testing. No pathogenic variants identified on the [facility] CancerNext-Expanded+RNA panel. VUS in [place] called [id number] identified. The report date is 05/03/2022.  The CancerNext-Expanded + RNAinsight gene panel offered by [employer] and includes sequencing and rearrangement analysis for the following 77 genes: IP, ALK, APC*, ATM*, AXIN2, BAP1, BARD1, BLM, BMPR1A, BRCA1*, BRCA2*, BRIP1*, CDC73, CDH1*,CDK4, CDKN1B, CDKN2A, CHEK2*, CTNNA1, DICER1, FANCC, FH, FLCN, GALNT12, KIF1B, LZTR1, MAX, MEN1, MET, MLH1*, MSH2*, MSH3, MSH6*, MUTYH*, NBN, NF1*, NF2, NTHL1, PALB2*, PHOX2B, PMS2*, POT1, PRKAR1A, PTCH1, PTEN*, RAD51C*, RAD51D*,RB1, RECQL, RET, SDHA, SDHAF2, SDHB, SDHC, SDHD, SMAD4, SMARCA4, SMARCB1, SMARCE1, STK11, SUFU, TMEM127, TP53*,TSC1, TSC2, VHL and XRCC2 (sequencing and deletion/duplication); EGFR, EGLN1, HOXB13, KIT, MITF, PDGFRA, POLD1 and POLE (sequencing only); EPCAM and GREM1 (deletion/duplication only).     PHYSICAL EXAMINATION: ECOG PERFORMANCE STATUS: 0 - Asymptomatic  Vitals:   07/14/22 0809  BP: (!) 162/96  Pulse: (!) 118  Resp: 18  Temp: 98.8 F (37.1 C)  SpO2: 100%   Filed Weights   07/14/22 0809  Weight: 135 lb 3.2 oz (61.3 kg)    GENERAL:alert, no distress and comfortable  NEURO: alert & oriented x 3 with fluent speech, no focal motor/sensory deficits  LABORATORY  DATA:  I have reviewed the data as listed    Component Value Date/Time   NA 139 07/14/2022 0753   K 3.8 07/14/2022 0753   CL 105 07/14/2022 0753   CO2 24 07/14/2022 0753   GLUCOSE 162 (H) 07/14/2022 0753   BUN 17 07/14/2022 0753   CREATININE 0.65 07/14/2022 0753   CALCIUM 9.8 07/14/2022 0753   PROT 7.7 07/14/2022 0753   ALBUMIN 4.5 07/14/2022 0753   AST 21 07/14/2022 0753   ALT 28 07/14/2022 0753   ALKPHOS 87 07/14/2022 0753   BILITOT 0.3 07/14/2022 0753   GFRNONAA >60 07/14/2022 0753    No results found for: "SPEP", "UPEP"  Lab Results  Component Value Date   WBC 11.0 (H) 07/14/2022  NEUTROABS 9.3 (H) 07/14/2022   HGB 12.3 07/14/2022   HCT 36.2 07/14/2022   MCV 74.5 (L) 07/14/2022   PLT 137 (L) 07/14/2022      Chemistry      Component Value Date/Time   NA 139 07/14/2022 0753   K 3.8 07/14/2022 0753   CL 105 07/14/2022 0753   CO2 24 07/14/2022 0753   BUN 17 07/14/2022 0753   CREATININE 0.65 07/14/2022 0753      Component Value Date/Time   CALCIUM 9.8 07/14/2022 0753   ALKPHOS 87 07/14/2022 0753   AST 21 07/14/2022 0753   ALT 28 07/14/2022 0753   BILITOT 0.3 07/14/2022 0753

## 2022-07-15 NOTE — Assessment & Plan Note (Signed)
This is related to her treatment She is not symptomatic We will proceed with treatment without delay

## 2022-07-16 ENCOUNTER — Other Ambulatory Visit: Payer: Self-pay

## 2022-07-16 LAB — T4: T4, Total: 8.4 ug/dL (ref 4.5–12.0)

## 2022-07-24 ENCOUNTER — Other Ambulatory Visit: Payer: Self-pay

## 2022-07-25 ENCOUNTER — Other Ambulatory Visit (HOSPITAL_COMMUNITY): Payer: Self-pay

## 2022-07-27 ENCOUNTER — Telehealth: Payer: Self-pay

## 2022-07-27 NOTE — Telephone Encounter (Signed)
Ms.Franzoni called office this morning, stating she has a follow up appointment on 2/22 with Dr.Tucker. She states she is not having any issues currently. No vaginal bleeding/discharge/pain. She is wanting to know if she really needs the appointment on 2/22?  Pt aware I will ask Dr.Tucker and call her back.

## 2022-07-27 NOTE — Telephone Encounter (Signed)
Returned her call to clarify PET scan scheduled on 2/19. Instructed per Dr. Alvy Bimler to keep PET as scheduled and CT scan order has been canceled. She verbalized understanding.

## 2022-07-27 NOTE — Telephone Encounter (Signed)
Pt is aware to keep the appointment for an exam with Dr.Tucker on 2/22. Advised her to use the cream 1 week before appointment and also told her Joylene John NP sent in Ativan for her to use per instructions. Pt voiced an understanding

## 2022-08-01 ENCOUNTER — Telehealth: Payer: Self-pay

## 2022-08-01 NOTE — Telephone Encounter (Signed)
Returned her call. She does not want to use port for lab on 2/22. Changed 8 am appt to lab. She is aware of appts and Dr. Alvy Bimler aware.

## 2022-08-02 ENCOUNTER — Other Ambulatory Visit: Payer: Self-pay

## 2022-08-03 ENCOUNTER — Telehealth: Payer: Self-pay

## 2022-08-03 ENCOUNTER — Other Ambulatory Visit: Payer: Self-pay | Admitting: Hematology and Oncology

## 2022-08-03 DIAGNOSIS — C539 Malignant neoplasm of cervix uteri, unspecified: Secondary | ICD-10-CM

## 2022-08-03 NOTE — Telephone Encounter (Signed)
Called her and told her insurance did not approve PET scan scheduled on 2/19. PET canceled. Dr. Alvy Bimler order CT scan, appt scheduled on 2/23 at [facility], arrive at 1230 to start drinking contrast for 3 pm appt. NPO 4 hours prior to CT scan. She verbalized understanding.

## 2022-08-04 ENCOUNTER — Other Ambulatory Visit: Payer: Self-pay

## 2022-08-04 ENCOUNTER — Telehealth: Payer: Self-pay

## 2022-08-04 ENCOUNTER — Other Ambulatory Visit: Payer: Self-pay | Admitting: Hematology and Oncology

## 2022-08-04 NOTE — Telephone Encounter (Signed)
Returned her call. She is upset that insurance denies PET scan. Reviewed again with her the reason it was denied. CT has been order and scheduled. She is aware of appt for CT. She would like to reschedule 2/22 appts to Tuesday 2/27. Forwarded a message to Dr. Alvy Bimler.

## 2022-08-04 NOTE — Telephone Encounter (Addendum)
Ms.Beranek called stating she has an appointment with Dr.Tucker on 2/22. Her CT scan had to be moved to 2/23. Dr. Berline Lopes wanted to see her after the scan. Does she need to keep the 2/22 appointment and Dr. Berline Lopes can call with results?   Dr. Berline Lopes notified, on when to schedule pt.

## 2022-08-07 ENCOUNTER — Encounter (HOSPITAL_COMMUNITY): Payer: Self-pay

## 2022-08-07 ENCOUNTER — Telehealth: Payer: Self-pay | Admitting: Hematology and Oncology

## 2022-08-07 ENCOUNTER — Other Ambulatory Visit (HOSPITAL_COMMUNITY): Payer: Self-pay

## 2022-08-07 ENCOUNTER — Telehealth: Payer: Self-pay

## 2022-08-07 ENCOUNTER — Ambulatory Visit (HOSPITAL_COMMUNITY): Payer: 59

## 2022-08-07 NOTE — Telephone Encounter (Signed)
Left patient a vm regarding appointment change  

## 2022-08-07 NOTE — Telephone Encounter (Signed)
Returned her call. She is asking about a bill that she got for 06/23/22 service date for pathology. Left phone # for billing questions of [phone].  Ask her to call the office for questions.

## 2022-08-08 ENCOUNTER — Other Ambulatory Visit: Payer: Self-pay

## 2022-08-09 ENCOUNTER — Telehealth: Payer: Self-pay

## 2022-08-09 ENCOUNTER — Other Ambulatory Visit: Payer: Self-pay | Admitting: Hematology and Oncology

## 2022-08-09 DIAGNOSIS — C539 Malignant neoplasm of cervix uteri, unspecified: Secondary | ICD-10-CM

## 2022-08-09 NOTE — Telephone Encounter (Signed)
Pt called and voiced concern regarding bill from [facility] 06/23/22. She states [facility] billing dept can't help with this as it is from [place]. She would like to know what was sent to [facility] and why it was not [facility], as she prefers to make payments via [facility]. It appears these labs were ordered by Dr Berline Lopes. Message sent to [place], [place].

## 2022-08-09 NOTE — Progress Notes (Unsigned)
Gynecologic Oncology Return Clinic Visit  08/10/22  Reason for Visit: treatment planning  Treatment History: Oncology History Overview Note  HER2 negative, Neg Genetics MMR Normal P53 positive   Uterine cancer ([place])  12/03/2021 Initial Diagnosis   Patient reports several month history of intermittent pelvic pain that she describes as discomfort or sometimes cramping, that has worsened with time.  She notes that this is tolerable.  She began having postmenopausal bleeding started on 6/17 with a few spots of blood.     12/29/2021 Imaging   1. Bilateral large multiloculated cystic lesions of the ovaries, concerning for ovarian malignancy. Recommend gynecologic consultation and pelvic ultrasound and/or pelvic MRI with contrast for further evaluation. 2. Large heterogeneous mass of the posterior pelvis likely arising from the lower uterus, possibly a fibroid, although malignancy is also a concern. Recommend attention on pelvic ultrasound or MRI as above. 3. Additional mass arising from the right uterine fundus which correlates with fibroid described on prior 2015 ultrasound. 4. Nonspecific small ground-glass nodule of the right lower lobe measuring 6 mm. Recommend follow-up chest CT in 6 months for further evaluation. 5. Aortic Atherosclerosis (ICD10-I70.0).   12/30/2021 Pathology Results   A. CERVIX, BIOPSY:  -  Scantly cellular specimen with predominantly blood, fibrin and acute inflammatory infiltrate but with scattered highly atypical cells highly suspicious for malignancy.   Note: A p16 shows strong positivity in these scattered cells; however, while p53 is mildly increased definitive strong overexpression or under expression is not identified.  These findings support the suspicion for malignancy; however, are insufficient for a definitive diagnosis.     01/03/2022 Imaging   MRI pelvis  1. Large mixed solid and cystic lesions of the bilateral ovaries, measuring 8.4 x 7.0 x 5.4 cm on the  right and 10.3 x 8.7 x 8.1 cm on the left, highly concerning for primary ovarian malignancy and contralateral ovarian metastasis.   2. Homogeneously enhancing mass arising from the right aspect of the uterine body and fundus measuring 6.0 x 5.5 x 4.3 cm, consistent with a uterine fibroid and similar to prior ultrasound dated 2015.   3. An additional, much more heterogeneous mass which appears to arise from the cervix or posterior lower uterine segment measuring 8.1 x 6.4 x 6.2 cm. This may reflect an additional fibroid with internal degeneration, however this was not clearly visualized on ultrasound dated 2015 and is highly suspicious for a cervical mass as significant enlargement of benign fibroids is not expected following menopause. This mass appears to closely abut and compress or perhaps even directly involve the adjacent rectum.   4.  No evidence of lymphadenopathy in the pelvis.   01/12/2022 PET scan   1. Hypermetabolic heterogeneous mass centered on the cervix/lower uterine segment is most consistent with primary gynecologic neoplasm. 2. Multiloculated complex bilateral adnexal cystic lesions with hypermetabolic soft tissue component likely reflect metastatic lesions. 3. No hypermetabolic abdominopelvic adenopathy. 4. No evidence of hypermetabolic metastatic disease in the chest or abdomen.   01/18/2022 Pathology Results   FINAL MICROSCOPIC DIAGNOSIS:   A. CERVICAL MASS, BIOPSIES:  - Poorly differentiated carcinoma with necrosis, consistent with high-grade serous carcinoma, see comment   B. CERVICAL MASS, EXCISION:  - Poorly differentiated carcinoma with necrosis, consistent with high-grade serous carcinoma see comment  - Edges of resected tissue fragments are positive for carcinoma   COMMENT:   A and B.   The tumor cells are positive for PAX8, ER and p16 immunostains.  Immunostain for p53 shows significant overexpression.  Immunostain for CK5/6 shows weak focal labeling while  immunostain for  p63 is negative.  The immunoprofile is consistent with above interpretation.  The carcinoma is likely endometrial or ovarian origin with secondary involvement of the cervix.     01/23/2022 Initial Diagnosis   Uterine cancer ([place])   01/23/2022 Cancer Staging   Staging form: Corpus Uteri - Carcinoma and Carcinosarcoma, AJCC 8th Edition - Clinical stage from 01/23/2022: FIGO Stage IIIA (cT3a, cN0, cM0) - Signed by [name], MD on 01/23/2022 Stage prefix: Initial diagnosis   01/31/2022 - 01/31/2022 Chemotherapy   Patient is on Treatment Plan : UTERINE ENDOMETRIAL Dostarlimab-gxly (500 mg) + Carboplatin (AUC 5) + Paclitaxel (175 mg/m2) q21d x 6 cycles / Dostarlimab-gxly (1000 mg) q42d x 6 cycles      01/31/2022 -  Chemotherapy   Patient is on Treatment Plan : UTERINE ENDOMETRIAL Dostarlimab-gxly (500 mg) + Carboplatin (AUC 5) + Paclitaxel (175 mg/m2) q21d x 6 cycles / Dostarlimab-gxly (1000 mg) q42d x 6 cycles      04/14/2022 PET scan   1. Significant interval positive response to therapy. Mildly hypermetabolic solid 5.5 x 2.8 cm posterior uterine cervix mass and mildly hypermetabolic bilateral adnexal mixed cystic and solid masses are all significantly decreased in size and metabolism. 2. No new or progressive hypermetabolic metastatic disease. 3. Low level FDG uptake associated with healing subacute sclerotic nondisplaced anterior left third, fourth and fifth rib fractures without associated discrete osseous lesions, correlate for history of interval injury in this location. 4. Horseshoe kidney.   05/03/2022 Surgery   Robotic-assisted laparoscopic total hysterectomy with bilateral salpingo-oophorectomy, tumor debulking including infracolic omentectomy, mini-lap, cystoscopy, and vaginal laceration repair   Findings:  On speculum exam, normal-appearing cervix that is flush with the vaginal apex.  No visible cervical lesion.  On EUA, moderately mobile, enlarged uterus with mobile  fullness appreciated in the cul-de-sac.  On intra-abdominal entry, normal upper abdominal survey.  Some adhesions of the omentum to the anterior abdominal wall below the umbilicus were noted.  Normal-appearing omentum, small and large bowel.  No ascites.  Right ovary minimally enlarged but overall normal in appearance.  Left ovary replaced by a 6 cm cystic mass, smooth.  Normal-appearing bilateral fallopian tubes.  Bladder was somewhat adherent anteriorly to the cervix.  Uterus approximately 8 cm and deviated to the left given a 10 cm anterior and right-sided fibroid.  The cervix itself did not appear enlarged, either on intra-abdominal phonation or speculum exam.  Posteriorly, a white ridge of somewhat indurated tissue was noted underneath the cervix and just above the rectum.  On palpation, this ridge could be appreciated only on rectovaginal exam.  It was separate and free from the cervix itself.  It was difficult to distinguish whether this was treated tumor or active tumor although its detached location from the cervix and uterus was noted.  Given my conversation with the patient prior to surgery and that she would not want an ostomy (even temporarily), there was no way to resect this area out without significant risk of damage to the rectum.  Given its location, even in the setting of a bowel prep (which she had not had), she would likely require at least a protective diverting ostomy. Cystoscopy, bladder dome noted to be intact and good efflux seen from bilateral ureteral orifices.   05/03/2022 Pathology Results   A. UTERUS, CERVIX, BILATERAL TUBES AND OVARIES, RESECTION:  - Cervix      Nabothian cysts      Negative  for malignancy  - Endometrium      Inactive endometrium      Two benign endometrial polyps      Negative for malignancy  - Myometrium      Leiomyoma      Negative for malignancy  - Right ovary      Hemorrhagic cyst with fibrosis, hemosiderin and focal calcification      Negative  for malignancy  - Left ovary      Hemorrhagic cyst with fibrosis, hemosiderin and focal calcification      Negative for malignancy  - Bilateral fallopian tubes      Unremarkable      Negative for malignancy  - See oncology table   B. OMENTUM, EXCISION:  - Benign omental adipose tissue.  - Negative for malignancy.     Genetic Testing   Negative genetic testing. No pathogenic variants identified on the [facility] CancerNext-Expanded+RNA panel. VUS in [place] called [id number] identified. The report date is 05/03/2022.  The CancerNext-Expanded + RNAinsight gene panel offered by [employer] and includes sequencing and rearrangement analysis for the following 77 genes: IP, ALK, APC*, ATM*, AXIN2, BAP1, BARD1, BLM, BMPR1A, BRCA1*, BRCA2*, BRIP1*, CDC73, CDH1*,CDK4, CDKN1B, CDKN2A, CHEK2*, CTNNA1, DICER1, FANCC, FH, FLCN, GALNT12, KIF1B, LZTR1, MAX, MEN1, MET, MLH1*, MSH2*, MSH3, MSH6*, MUTYH*, NBN, NF1*, NF2, NTHL1, PALB2*, PHOX2B, PMS2*, POT1, PRKAR1A, PTCH1, PTEN*, RAD51C*, RAD51D*,RB1, RECQL, RET, SDHA, SDHAF2, SDHB, SDHC, SDHD, SMAD4, SMARCA4, SMARCB1, SMARCE1, STK11, SUFU, TMEM127, TP53*,TSC1, TSC2, VHL and XRCC2 (sequencing and deletion/duplication); EGFR, EGLN1, HOXB13, KIT, MITF, PDGFRA, POLD1 and POLE (sequencing only); EPCAM and GREM1 (deletion/duplication only).    Received C7 of carboplatin/taxol/dostarlimab on 07/14/22.  Interval History: Patient reports overall doing well.  She denies any abdominal or pelvic pain.  Reports having normal bowel function, denies any rectal pain.  Reports normal urinary function.  Vaginal spotting stopped 5 or 6 weeks ago.  She has been using the vaginal estrogen for the last 2 weeks in anticipation of her visit with me.  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   oncologist-- dr Alvy Bimler;   dx 06/ 2023   Multinodular thyroid    bilateral nodules ;  hx bx 07-31-2017 benign ;  last ultrasound in epic 01-08-2020   Neuropathy due to chemotherapeutic drug [facility])    Uterine cancer [facility]) 01/2022   gyn oncologist-- dr Onix Jumper/  oncologist--- dr Alvy Bimler;  dx 01-23-2022,  started chemo 01-31-2022    Past Surgical History:  Procedure Laterality Date   CESAREAN SECTION     COLONOSCOPY  2016   dr Jeneen Rinks [facility])   CYSTOSCOPY N/A 05/03/2022   Procedure: CYSTOSCOPY;  Surgeon: [name], MD;  Location: [facility];  Service: Gynecology;  Laterality: N/A;   DILATION AND EVACUATION  08/17/2000   @[facility]$  for missed ab   IR IMAGING GUIDED PORT INSERTION  01/25/2022   LESION REMOVAL N/A 01/18/2022   Procedure: CERVICAL BIOPSIES;  Surgeon: [name], MD;  Location: [facility];  Service: Gynecology;  Laterality: N/A;   ROBOTIC ASSISTED TOTAL HYSTERECTOMY WITH BILATERAL SALPINGO OOPHERECTOMY  05/03/2022   @[facility]$  by dr Berline Lopes ;   with mini lap, omentectomy, tumor debulking    Family History  Problem Relation Age of Onset   Thyroid  disease Mother    Breast cancer Mother        dx late 95s   Cancer Other        unk type, d. 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  Tobacco Use   Smoking status: Never   Smokeless tobacco: Never  Vaping Use   Vaping Use: Never used  Substance and Sexual Activity   Alcohol use: Never   Drug use: Never   Sexual activity: Not Currently  Other Topics Concern   Not on file  Social History Narrative   Not on file    Social Determinants of Health   Financial Resource Strain: Not on file  Food Insecurity: No Food Insecurity (05/03/2022)   Hunger Vital Sign    Worried About Running Out of Food in the Last Year: Never true    Ran Out of Food in the Last Year: Never true  Transportation Needs: No Transportation Needs (05/03/2022)   PRAPARE - Hydrologist (Medical): No    Lack of Transportation (Non-Medical): No  Physical Activity: Not on file  Stress: Not on file  Social Connections: Not on file    Current Medications:  Current Outpatient Medications:    Cholecalciferol (VITAMIN D3 PO), Take 1 tablet by mouth daily., Disp: , Rfl:    conjugated estrogens (PREMARIN) vaginal cream, Place 1 Applicatorful vaginally 3 (three) times a week. Place one fingertip's worth of cream in the outer part of the vagina 3x a week at night, Disp: 42.5 g, Rfl: 1   dexamethasone (DECADRON) 4 MG tablet, Take 2 tablets by mouth the night before and 2 tablets the morning of chemotherapy, every 3 weeks, for 6 cycles, Disp: 4 tablet, Rfl: 6   LORazepam (ATIVAN) 0.5 MG tablet, Take 1 tablet by mouth once for 1 dose. Take 30 minutes prior to appointment, have someone drive you to and from the appointment, Disp: 1 tablet, Rfl: 0   Multiple Vitamin (MULTIVITAMIN WITH MINERALS) TABS tablet, Take 1 tablet by mouth daily., Disp: , Rfl:    ondansetron (ZOFRAN) 4 MG tablet, Take 4 mg by mouth every 8 (eight) hours as needed for nausea or vomiting., Disp: , Rfl:    prochlorperazine (COMPAZINE) 10 MG tablet, Take 10 mg by mouth every 6 (six) hours as needed for nausea or vomiting., Disp: , Rfl:   Review of Systems: + Fatigue, vision problems Denies appetite changes, fevers, chills, nexplained weight changes. Denies hearing loss, neck lumps or masses, mouth sores, ringing in ears or voice changes. Denies cough or wheezing.  Denies shortness of breath. Denies chest pain or palpitations. Denies leg  swelling. Denies abdominal distention, pain, blood in stools, constipation, diarrhea, nausea, vomiting, or early satiety. Denies pain with intercourse, dysuria, frequency, hematuria or incontinence. Denies hot flashes, pelvic pain, vaginal bleeding or vaginal discharge.   Denies joint pain, back pain or muscle pain/cramps. Denies itching, rash, or wounds. Denies dizziness, headaches, numbness or seizures. Denies swollen lymph nodes or  glands, denies easy bruising or bleeding. Denies anxiety, depression, confusion, or decreased concentration.  Physical Exam: BP (!) 149/95 (BP Location: Left Arm, Patient Position: Sitting) Comment: informed Dr Berline Lopes; patient didn't want to do the R arm; BP was done on L arm  Pulse 90   Temp 98.1 F (36.7 C) (Oral)   Resp 14   Wt 139 lb 12.8 oz (63.4 kg)   SpO2 100%   BMI 24.76 kg/m  General: Alert, oriented, no acute distress. HEENT: Normocephalic, atraumatic, sclera anicteric. Chest: Unlabored breathing on room air. Abdomen: soft, nontender.  Normoactive bowel sounds.  No masses or hepatosplenomegaly appreciated.  Well-healed incisions. Extremities: Grossly normal range of motion.  Warm, well perfused.  No edema bilaterally. GU: Normal appearing external genitalia without erythema, excoriation, or lesions.  Speculum exam with extra small speculum is somewhat tolerated by the patient.  Unable to see the apex but along the length of the speculum, is normal in appearance.  Bimanual exam 1 digit reveals vaginal mucosa is smooth, no nodularity or masses appreciated.  Rectovaginal exam confirms findings, although poorly tolerated by the patient.  I was only able to reach approximately 3 cm into the rectum.  Laboratory & Radiologic Studies: Scheduled for CT A/P on 09-04-2022  Assessment & Plan: Tiffany [name] is a 62 y.o. 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    Patient is overall doing well.   She is completed adjuvant chemotherapy.  Has some neuropathy although feels that this is already starting to improve.  She has posttreatment scan scheduled tomorrow.  She sees Dr. Alvy Bimler next week.  Tentative plan is for her to continue on maintenance immunotherapy.  We discussed data for what is presumed to be uterine cancer today.  More recent trials show the strongest benefit for advanced uterine cancer in patients who are mismatch repair deficient.  In looking at PFS and OS among patients in the RUBY trial (C/T/dostlimab) by molecular subgroup, those with p53 abnormal tumors also seem to have benefit from immunotherapy.  Given her remarkable response to neoadjuvant chemotherapy, I would strongly encourage maintenance immunotherapy.  We reviewed signs and symptoms that would be concerning for disease recurrence.  I stressed the importance of calling if she develops any of these between visits.  Given how challenging exams are for her, we will likely plan on pelvic exam every 4-6 months.  She took Ativan before her visit today which seemed to help with her ability to tolerate the exam.  28 minutes of total time was spent for this patient encounter, including preparation, face-to-face counseling with the patient and coordination of care, and documentation of the encounter.  Jeral Pinch, MD  Division of Gynecologic Oncology  Department of Obstetrics and Gynecology  [facility]

## 2022-08-10 ENCOUNTER — Inpatient Hospital Stay: Payer: 59 | Admitting: Hematology and Oncology

## 2022-08-10 ENCOUNTER — Inpatient Hospital Stay: Payer: 59

## 2022-08-10 ENCOUNTER — Encounter: Payer: Self-pay | Admitting: Gynecologic Oncology

## 2022-08-10 ENCOUNTER — Other Ambulatory Visit: Payer: Self-pay

## 2022-08-10 ENCOUNTER — Inpatient Hospital Stay: Payer: 59 | Attending: Gynecologic Oncology | Admitting: Gynecologic Oncology

## 2022-08-10 VITALS — BP 149/95 | HR 90 | Temp 98.1°F | Resp 14 | Wt 139.8 lb

## 2022-08-10 DIAGNOSIS — Z79620 Long term (current) use of immunosuppressive biologic: Secondary | ICD-10-CM | POA: Insufficient documentation

## 2022-08-10 DIAGNOSIS — F419 Anxiety disorder, unspecified: Secondary | ICD-10-CM | POA: Diagnosis not present

## 2022-08-10 DIAGNOSIS — N83202 Unspecified ovarian cyst, left side: Secondary | ICD-10-CM | POA: Diagnosis not present

## 2022-08-10 DIAGNOSIS — C55 Malignant neoplasm of uterus, part unspecified: Secondary | ICD-10-CM

## 2022-08-10 DIAGNOSIS — Q631 Lobulated, fused and horseshoe kidney: Secondary | ICD-10-CM | POA: Diagnosis not present

## 2022-08-10 DIAGNOSIS — G629 Polyneuropathy, unspecified: Secondary | ICD-10-CM | POA: Diagnosis not present

## 2022-08-10 DIAGNOSIS — Z809 Family history of malignant neoplasm, unspecified: Secondary | ICD-10-CM | POA: Insufficient documentation

## 2022-08-10 DIAGNOSIS — Z7189 Other specified counseling: Secondary | ICD-10-CM

## 2022-08-10 DIAGNOSIS — E039 Hypothyroidism, unspecified: Secondary | ICD-10-CM | POA: Insufficient documentation

## 2022-08-10 DIAGNOSIS — N83201 Unspecified ovarian cyst, right side: Secondary | ICD-10-CM | POA: Diagnosis not present

## 2022-08-10 DIAGNOSIS — Z90710 Acquired absence of both cervix and uterus: Secondary | ICD-10-CM | POA: Diagnosis not present

## 2022-08-10 DIAGNOSIS — N952 Postmenopausal atrophic vaginitis: Secondary | ICD-10-CM | POA: Diagnosis not present

## 2022-08-10 DIAGNOSIS — C579 Malignant neoplasm of female genital organ, unspecified: Secondary | ICD-10-CM

## 2022-08-10 DIAGNOSIS — Z86010 Personal history of colonic polyps: Secondary | ICD-10-CM | POA: Diagnosis not present

## 2022-08-10 DIAGNOSIS — Z5112 Encounter for antineoplastic immunotherapy: Secondary | ICD-10-CM | POA: Insufficient documentation

## 2022-08-10 DIAGNOSIS — Z8349 Family history of other endocrine, nutritional and metabolic diseases: Secondary | ICD-10-CM | POA: Insufficient documentation

## 2022-08-10 DIAGNOSIS — Z803 Family history of malignant neoplasm of breast: Secondary | ICD-10-CM | POA: Insufficient documentation

## 2022-08-10 DIAGNOSIS — Z79899 Other long term (current) drug therapy: Secondary | ICD-10-CM | POA: Insufficient documentation

## 2022-08-10 NOTE — Patient Instructions (Signed)
It was good to see you today. Please call if you develop any of the symptoms we discussed between now and your follow-up. I will see you in June.

## 2022-08-11 ENCOUNTER — Telehealth: Payer: Self-pay

## 2022-08-11 ENCOUNTER — Ambulatory Visit (HOSPITAL_COMMUNITY)
Admission: RE | Admit: 2022-08-11 | Discharge: 2022-08-11 | Disposition: A | Discharge status: Home / Self Care | Payer: 59 | Source: Ambulatory Visit | Attending: Hematology and Oncology | Admitting: Hematology and Oncology

## 2022-08-11 ENCOUNTER — Other Ambulatory Visit: Payer: Self-pay

## 2022-08-11 DIAGNOSIS — C539 Malignant neoplasm of cervix uteri, unspecified: Secondary | ICD-10-CM | POA: Insufficient documentation

## 2022-08-11 MED ORDER — IOHEXOL 9 MG/ML PO SOLN: 1000.0000 mL | Freq: Once | ORAL | Status: DC

## 2022-08-11 MED ORDER — IOHEXOL 300 MG/ML  SOLN
100.0000 mL | Freq: Once | INTRAMUSCULAR | Status: AC | PRN
  Administered 2022-08-11: 100 mL via INTRAVENOUS

## 2022-08-11 MED ORDER — IOHEXOL 9 MG/ML PO SOLN: 500.0000 mL | ORAL | Status: DC

## 2022-08-11 NOTE — Telephone Encounter (Signed)
Returned her call. She is upset that someone changed lab appt to port lab/flush appt on 2/27. Canceled flush appt and changed to lab only at 0730. She does not want to use her port on 2/27.  Left her a detailed message and ask her to call the office for questions.

## 2022-08-14 ENCOUNTER — Telehealth: Payer: Self-pay

## 2022-08-14 NOTE — Telephone Encounter (Signed)
Called and offered appt today with Dr. Alvy Bimler. She called the on call answering service with cold symptoms/ fever over the weekend. She declined appt today and will keep appt as scheduled tomorrow. She is feeling better and thinks she just has a cold. She started using a nasal spray and tylenol cold medication. She denies fever and said that she did not have a fever over the weekend.  FYI

## 2022-08-15 ENCOUNTER — Other Ambulatory Visit (HOSPITAL_BASED_OUTPATIENT_CLINIC_OR_DEPARTMENT_OTHER): Payer: 59

## 2022-08-15 ENCOUNTER — Inpatient Hospital Stay (HOSPITAL_BASED_OUTPATIENT_CLINIC_OR_DEPARTMENT_OTHER): Payer: 59 | Admitting: Hematology and Oncology

## 2022-08-15 ENCOUNTER — Inpatient Hospital Stay: Payer: 59

## 2022-08-15 ENCOUNTER — Inpatient Hospital Stay: Payer: 59 | Admitting: Licensed Clinical Social Worker

## 2022-08-15 ENCOUNTER — Encounter: Payer: Self-pay | Admitting: Hematology and Oncology

## 2022-08-15 VITALS — BP 143/80 | HR 79 | Temp 99.5°F | Resp 18 | Ht 63.0 in | Wt 138.4 lb

## 2022-08-15 DIAGNOSIS — C539 Malignant neoplasm of cervix uteri, unspecified: Secondary | ICD-10-CM | POA: Diagnosis not present

## 2022-08-15 DIAGNOSIS — K760 Fatty (change of) liver, not elsewhere classified: Secondary | ICD-10-CM | POA: Diagnosis not present

## 2022-08-15 DIAGNOSIS — R718 Other abnormality of red blood cells: Secondary | ICD-10-CM | POA: Diagnosis not present

## 2022-08-15 DIAGNOSIS — Z5112 Encounter for antineoplastic immunotherapy: Secondary | ICD-10-CM | POA: Diagnosis not present

## 2022-08-15 LAB — CBC WITH DIFFERENTIAL (CANCER CENTER ONLY)
Abs Immature Granulocytes: 0.01 10*3/uL (ref 0.00–0.07)
Basophils Absolute: 0 10*3/uL (ref 0.0–0.1)
Basophils Relative: 1 %
Eosinophils Absolute: 0 10*3/uL (ref 0.0–0.5)
Eosinophils Relative: 0 %
HCT: 34.4 % — ABNORMAL LOW (ref 36.0–46.0)
Hemoglobin: 11.6 g/dL — ABNORMAL LOW (ref 12.0–15.0)
Immature Granulocytes: 0 %
Lymphocytes Relative: 40 %
Lymphs Abs: 1.7 10*3/uL (ref 0.7–4.0)
MCH: 26.1 pg (ref 26.0–34.0)
MCHC: 33.7 g/dL (ref 30.0–36.0)
MCV: 77.3 fL — ABNORMAL LOW (ref 80.0–100.0)
Monocytes Absolute: 0.7 10*3/uL (ref 0.1–1.0)
Monocytes Relative: 16 %
Neutro Abs: 1.9 10*3/uL (ref 1.7–7.7)
Neutrophils Relative %: 43 %
Platelet Count: 156 10*3/uL (ref 150–400)
RBC: 4.45 MIL/uL (ref 3.87–5.11)
RDW: 17 % — ABNORMAL HIGH (ref 11.5–15.5)
WBC Count: 4.3 10*3/uL (ref 4.0–10.5)
nRBC: 0 % (ref 0.0–0.2)

## 2022-08-15 LAB — CMP (CANCER CENTER ONLY)
ALT: 33 U/L (ref 0–44)
AST: 30 U/L (ref 15–41)
Albumin: 4.3 g/dL (ref 3.5–5.0)
Alkaline Phosphatase: 71 U/L (ref 38–126)
Anion gap: 10 (ref 5–15)
BUN: 13 mg/dL (ref 6–20)
CO2: 24 mmol/L (ref 22–32)
Calcium: 8.7 mg/dL — ABNORMAL LOW (ref 8.9–10.3)
Chloride: 105 mmol/L (ref 98–111)
Creatinine: 0.76 mg/dL (ref 0.44–1.00)
GFR, Estimated: >60 mL/min (ref >60)
Glucose, Bld: 118 mg/dL — ABNORMAL HIGH (ref 70–99)
Potassium: 3.9 mmol/L (ref 3.5–5.1)
Sodium: 139 mmol/L (ref 135–145)
Total Bilirubin: 0.4 mg/dL (ref 0.3–1.2)
Total Protein: 7.2 g/dL (ref 6.5–8.1)

## 2022-08-15 LAB — TSH: TSH: 8.337 u[IU]/mL — ABNORMAL HIGH (ref 0.350–4.500)

## 2022-08-15 MED ORDER — SODIUM CHLORIDE 0.9% FLUSH: 10.0000 mL | Freq: Once | INTRAVENOUS | Status: DC

## 2022-08-15 MED ORDER — SODIUM CHLORIDE 0.9 % IV SOLN: Freq: Once | INTRAVENOUS | Status: AC

## 2022-08-15 MED ORDER — SODIUM CHLORIDE 0.9 % IV SOLN
1000.0000 mg | Freq: Once | INTRAVENOUS | Status: AC
  Administered 2022-08-15: 1000 mg via INTRAVENOUS
  Filled 2022-08-15: qty 20

## 2022-08-15 NOTE — Assessment & Plan Note (Signed)
We reviewed multiple imaging studies together She has excellent response to therapy The change near the left pelvic 4 is likely postop changes Recommend observation only I plan to repeat imaging study again in 6 months for further follow-up We discussed the risk and benefits of continuing immunotherapy as maintenance and she is in agreement to proceed

## 2022-08-15 NOTE — Progress Notes (Signed)
Tiffany [name] OFFICE PROGRESS NOTE  Patient Care Team: Lawerance Cruel, MD as PCP - General (Family Medicine)  ASSESSMENT & PLAN:  Uterine cancer [facility]) We reviewed multiple imaging studies together Tiffany [name] has excellent response to therapy The change near the left pelvic 4 is likely postop changes Recommend observation only I plan to repeat imaging study again in 6 months for further follow-up We discussed the risk and benefits of continuing immunotherapy as maintenance and Tiffany [name] is in agreement to proceed  Microcytosis We reviewed her CBC I suspect Tiffany [name] might have alpha thalassemia That would explain her intermittent borderline anemia and microcytosis In order to diagnose this, we have to order additional workup Ultimately, the patient is comfortable not to pursue further workup on this  Hepatic steatosis This is likely due to chemotherapy We discussed importance of lifestyle changes and dietary modification  No orders of the defined types were placed in this encounter.   All questions were answered. The patient knows to call the clinic with any problems, questions or concerns. The total time spent in the appointment was 30 minutes encounter with patients including review of chart and various tests results, discussions about plan of care and coordination of care plan   [name], MD 08/15/2022 9:51 AM  INTERVAL HISTORY: Please see below for problem oriented charting. Tiffany [name] returns for treatment follow-up and review of test results Tiffany [name] tolerated last cycle of therapy well The patient elected not to get her port access in anticipation for future port removal We discussed imaging studies as well as results of her CBC  REVIEW OF SYSTEMS:   Constitutional: Denies fevers, [name], nose, mouth, throat, [name], dyspnea or wheezes Cardiovascular:  Denies palpitation, chest discomfort or lower extremity swelling Gastrointestinal:  Denies nausea, heartburn or change in bowel habits Skin: Denies abnormal skin rashes Lymphatics: Denies new lymphadenopathy or easy bruising Neurological:Denies numbness, tingling or new weaknesses Behavioral/Psych: Mood is stable, no new changes  All other systems were reviewed with the patient and are negative.  I have reviewed the past medical history, past surgical history, social history and family history with the patient and they are unchanged from previous note.  ALLERGIES:  is allergic to seasonal ic [octacosanol], chlorhexidine gluconate, and latex.  MEDICATIONS:  Current Outpatient Medications  Medication Sig Dispense Refill   Cholecalciferol (VITAMIN D3 PO) Take 1 tablet by mouth daily.     conjugated estrogens (PREMARIN) vaginal cream Place 1 Applicatorful vaginally 3 (three) times a week. Place one fingertip's worth of cream in the outer part of the vagina 3x a week at night 42.5 g 1   Multiple Vitamin (MULTIVITAMIN WITH MINERALS) TABS tablet Take 1 tablet by mouth daily.     No current facility-administered medications for this visit.   Facility-Administered Medications Ordered in Other Visits  Medication Dose Route Frequency Provider Last Rate Last Admin   dostarlimab-gxly (JEMPERLI) 1,000 mg in sodium chloride 0.9 % 100 mL (8.3333 mg/mL) chemo infusion  1,000 mg Intravenous Once Alvy Bimler, Emiliana Blaize, MD 240 mL/hr at 08/15/22 0925 1,000 mg at 08/15/22 0925   sodium chloride flush (NS) 0.9 % injection 10 mL  10 mL Intracatheter Once [name], MD        SUMMARY OF ONCOLOGIC HISTORY: Oncology History Overview Note  HER2 negative, Neg Genetics MMR Normal P53 positive   Uterine cancer ([place])  12/03/2021 Initial Diagnosis   Patient  reports several month history of intermittent pelvic pain that Tiffany [name] describes as discomfort or sometimes cramping, that has worsened with time.  Tiffany [name] notes that this is  tolerable.  Tiffany [name] began having postmenopausal bleeding started on 6/17 with a few spots of blood.     12/29/2021 Imaging   1. Bilateral large multiloculated cystic lesions of the ovaries, concerning for ovarian malignancy. Recommend gynecologic consultation and pelvic ultrasound and/or pelvic MRI with contrast for further evaluation. 2. Large heterogeneous mass of the posterior pelvis likely arising from the lower uterus, possibly a fibroid, although malignancy is also a concern. Recommend attention on pelvic ultrasound or MRI as above. 3. Additional mass arising from the right uterine fundus which correlates with fibroid described on prior 2015 ultrasound. 4. Nonspecific small ground-glass nodule of the right lower lobe measuring 6 mm. Recommend follow-up chest CT in 6 months for further evaluation. 5. Aortic Atherosclerosis (ICD10-I70.0).   12/30/2021 Pathology Results   A. CERVIX, BIOPSY:  -  Scantly cellular specimen with predominantly blood, fibrin and acute inflammatory infiltrate but with scattered highly atypical cells highly suspicious for malignancy.   Note: A p16 shows strong positivity in these scattered cells; however, while p53 is mildly increased definitive strong overexpression or under expression is not identified.  These findings support the suspicion for malignancy; however, are insufficient for a definitive diagnosis.     01/03/2022 Imaging   MRI pelvis  1. Large mixed solid and cystic lesions of the bilateral ovaries, measuring 8.4 x 7.0 x 5.4 cm on the right and 10.3 x 8.7 x 8.1 cm on the left, highly concerning for primary ovarian malignancy and contralateral ovarian metastasis.   2. Homogeneously enhancing mass arising from the right aspect of the uterine body and fundus measuring 6.0 x 5.5 x 4.3 cm, consistent with a uterine fibroid and similar to prior ultrasound dated 2015.   3. An additional, much more heterogeneous mass which appears to arise from the cervix or posterior  lower uterine segment measuring 8.1 x 6.4 x 6.2 cm. This may reflect an additional fibroid with internal degeneration, however this was not clearly visualized on ultrasound dated 2015 and is highly suspicious for a cervical mass as significant enlargement of benign fibroids is not expected following menopause. This mass appears to closely abut and compress or perhaps even directly involve the adjacent rectum.   4.  No evidence of lymphadenopathy in the pelvis.   01/12/2022 PET scan   1. Hypermetabolic heterogeneous mass centered on the cervix/lower uterine segment is most consistent with primary gynecologic neoplasm. 2. Multiloculated complex bilateral adnexal cystic lesions with hypermetabolic soft tissue component likely reflect metastatic lesions. 3. No hypermetabolic abdominopelvic adenopathy. 4. No evidence of hypermetabolic metastatic disease in the chest or abdomen.   01/18/2022 Pathology Results   FINAL MICROSCOPIC DIAGNOSIS:   A. CERVICAL MASS, BIOPSIES:  - Poorly differentiated carcinoma with necrosis, consistent with high-grade serous carcinoma, see comment   B. CERVICAL MASS, EXCISION:  - Poorly differentiated carcinoma with necrosis, consistent with high-grade serous carcinoma see comment  - Edges of resected tissue fragments are positive for carcinoma   COMMENT:   A and B.   The tumor cells are positive for PAX8, ER and p16 immunostains.  Immunostain for p53 shows significant overexpression. Immunostain for CK5/6 shows weak focal labeling while immunostain for  p63 is negative.  The immunoprofile is consistent with above interpretation.  The carcinoma is likely endometrial or ovarian origin with secondary involvement of the cervix.  01/23/2022 Initial Diagnosis   Uterine cancer ([place])   01/23/2022 Cancer Staging   Staging form: Corpus Uteri - Carcinoma and Carcinosarcoma, AJCC 8th Edition - Clinical stage from 01/23/2022: FIGO Stage IIIA (cT3a, cN0, cM0) - Signed by [name],  MD on 01/23/2022 Stage prefix: Initial diagnosis   01/31/2022 - 01/31/2022 Chemotherapy   Patient is on Treatment Plan : UTERINE ENDOMETRIAL Dostarlimab-gxly (500 mg) + Carboplatin (AUC 5) + Paclitaxel (175 mg/m2) q21d x 6 cycles / Dostarlimab-gxly (1000 mg) q42d x 6 cycles      01/31/2022 -  Chemotherapy   Patient is on Treatment Plan : UTERINE ENDOMETRIAL Dostarlimab-gxly (500 mg) + Carboplatin (AUC 5) + Paclitaxel (175 mg/m2) q21d x 6 cycles / Dostarlimab-gxly (1000 mg) q42d x 6 cycles      04/14/2022 PET scan   1. Significant interval positive response to therapy. Mildly hypermetabolic solid 5.5 x 2.8 cm posterior uterine cervix mass and mildly hypermetabolic bilateral adnexal mixed cystic and solid masses are all significantly decreased in size and metabolism. 2. No new or progressive hypermetabolic metastatic disease. 3. Low level FDG uptake associated with healing subacute sclerotic nondisplaced anterior left third, fourth and fifth rib fractures without associated discrete osseous lesions, correlate for history of interval injury in this location. 4. Horseshoe kidney.   05/03/2022 Surgery   Robotic-assisted laparoscopic total hysterectomy with bilateral salpingo-oophorectomy, tumor debulking including infracolic omentectomy, mini-lap, cystoscopy, and vaginal laceration repair   Findings:  On speculum exam, normal-appearing cervix that is flush with the vaginal apex.  No visible cervical lesion.  On EUA, moderately mobile, enlarged uterus with mobile fullness appreciated in the cul-de-sac.  On intra-abdominal entry, normal upper abdominal survey.  Some adhesions of the omentum to the anterior abdominal wall below the umbilicus were noted.  Normal-appearing omentum, small and large bowel.  No ascites.  Right ovary minimally enlarged but overall normal in appearance.  Left ovary replaced by a 6 cm cystic mass, smooth.  Normal-appearing bilateral fallopian tubes.  Bladder was somewhat adherent  anteriorly to the cervix.  Uterus approximately 8 cm and deviated to the left given a 10 cm anterior and right-sided fibroid.  The cervix itself did not appear enlarged, either on intra-abdominal phonation or speculum exam.  Posteriorly, a white ridge of somewhat indurated tissue was noted underneath the cervix and just above the rectum.  On palpation, this ridge could be appreciated only on rectovaginal exam.  It was separate and free from the cervix itself.  It was difficult to distinguish whether this was treated tumor or active tumor although its detached location from the cervix and uterus was noted.  Given my conversation with the patient prior to surgery and that Tiffany [name] would not want an ostomy (even temporarily), there was no way to resect this area out without significant risk of damage to the rectum.  Given its location, even in the setting of a bowel prep (which Tiffany [name] had not had), Tiffany [name] would likely require at least a protective diverting ostomy. Cystoscopy, bladder dome noted to be intact and good efflux seen from bilateral ureteral orifices.   05/03/2022 Pathology Results   A. 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 OMENTUM, EXCISION:  - Benign omental adipose tissue.  - Negative for malignancy.     Genetic Testing   Negative genetic testing. No pathogenic variants identified on the [facility] CancerNext-Expanded+RNA  panel. VUS in [place] called [id number] identified. The report date is 05/03/2022.  The CancerNext-Expanded + RNAinsight gene panel offered by [employer] and includes sequencing and rearrangement analysis for the following 77 genes: IP, ALK, APC*, ATM*, AXIN2, BAP1, BARD1, BLM, BMPR1A, BRCA1*, BRCA2*, BRIP1*, CDC73, CDH1*,CDK4, CDKN1B, CDKN2A, CHEK2*, CTNNA1, DICER1, FANCC, FH, FLCN, GALNT12, KIF1B, LZTR1, MAX, MEN1, MET, MLH1*, MSH2*, MSH3, MSH6*, MUTYH*, NBN, NF1*, NF2, NTHL1, PALB2*, PHOX2B, PMS2*, POT1, PRKAR1A, PTCH1, PTEN*, RAD51C*, RAD51D*,RB1, RECQL, RET, SDHA, SDHAF2, SDHB, SDHC, SDHD, SMAD4, SMARCA4, SMARCB1, SMARCE1, STK11, SUFU, TMEM127, TP53*,TSC1, TSC2, VHL and XRCC2 (sequencing and deletion/duplication); EGFR, EGLN1, HOXB13, KIT, MITF, PDGFRA, POLD1 and POLE (sequencing only); EPCAM and GREM1 (deletion/duplication only).   08/14/2022 Imaging   1. Prior hysterectomy without new suspicious enhancing soft tissue nodularity along the vaginal cuff. 2. Decreased size of the low-density focus along the left external iliac vessels, nonspecific. Interval decrease in size somewhat reassuring, and this is favored to reflect a small lymphocele or loculated fluid. However in the context of ongoing chemotherapy the interval decrease in size may reflect treatment response and as such while not favored metastatic disease can not be excluded. Continued attention on follow-up imaging is suggested. 3. No evidence of new or progressive disease in the abdomen or pelvis. 4. Diffuse hepatic steatosis.     PHYSICAL EXAMINATION: ECOG PERFORMANCE STATUS: 0 - Asymptomatic  Vitals:   08/15/22 0809  BP: (!) 143/80  Pulse: 79  Resp: 18  Temp: 99.5 F (37.5 C)  SpO2: 100%   Filed Weights   08/15/22 0809  Weight: 138 lb 6.4 oz (62.8 kg)    GENERAL:alert, no distress and comfortable  NEURO: alert & oriented x 3 with fluent speech, no focal motor/sensory deficits  LABORATORY DATA:  I have reviewed the  data as listed    Component Value Date/Time   NA 139 08/15/2022 0746   K 3.9 08/15/2022 0746   CL 105 08/15/2022 0746   CO2 24 08/15/2022 0746   GLUCOSE 118 (H) 08/15/2022 0746   BUN 13 08/15/2022 0746   CREATININE 0.76 08/15/2022 0746   CALCIUM 8.7 (L) 08/15/2022 0746   PROT 7.2 08/15/2022 0746   ALBUMIN 4.3 08/15/2022 0746   AST 30 08/15/2022 0746   ALT 33 08/15/2022 0746   ALKPHOS 71 08/15/2022 0746   BILITOT 0.4 08/15/2022 0746   GFRNONAA >60 08/15/2022 0746    No results found for: "SPEP", "UPEP"  Lab Results  Component Value Date   WBC 4.3 08/15/2022   NEUTROABS 1.9 08/15/2022   HGB 11.6 (L) 08/15/2022   HCT 34.4 (L) 08/15/2022   MCV 77.3 (L) 08/15/2022   PLT 156 08/15/2022      Chemistry      Component Value Date/Time   NA 139 08/15/2022 0746   K 3.9 08/15/2022 0746   CL 105 08/15/2022  0746   CO2 24 08/15/2022 0746   BUN 13 08/15/2022 0746   CREATININE 0.76 08/15/2022 0746      Component Value Date/Time   CALCIUM 8.7 (L) 08/15/2022 0746   ALKPHOS 71 08/15/2022 0746   AST 30 08/15/2022 0746   ALT 33 08/15/2022 0746   BILITOT 0.4 08/15/2022 0746       RADIOGRAPHIC STUDIES: I have reviewed multiple imaging studies with the patient I have personally reviewed the radiological images as listed and agreed with the findings in the report. CT ABDOMEN PELVIS W CONTRAST  Result Date: 08/14/2022 CLINICAL DATA:  History is cancer, assess treatment response. * Tracking Code: BO * EXAM: CT ABDOMEN AND PELVIS WITH CONTRAST TECHNIQUE: Multidetector CT imaging of the abdomen and pelvis was performed using the standard protocol following bolus administration of intravenous contrast. RADIATION DOSE REDUCTION: This exam was performed according to the departmental dose-optimization program which includes automated exposure control, adjustment of the mA and/or kV according to patient size and/or use of iterative reconstruction technique. CONTRAST:  182m OMNIPAQUE IOHEXOL 300  MG/ML  SOLN COMPARISON:  Multiple priors including MRI pelvis May 31, 2022 and nuclear medicine PET-CT April 14, 2022. FINDINGS: Lower chest: No acute abnormality. Partially visualized intracardiac lead. Hepatobiliary: Diffuse hepatic steatosis. No suspicious hepatic lesion. Gallbladder is unremarkable. No biliary ductal dilation. Pancreas: No pancreatic ductal dilation or evidence of acute inflammation. Spleen: No splenomegaly. Adrenals/Urinary Tract: Bilateral adrenal glands appear normal. Horseshoe renal morphology, anatomic variant. No hydronephrosis. Urinary bladder is unremarkable for degree of distension. Stomach/Bowel: Radiopaque enteric contrast material traverses the ascending colon. Stomach is minimally distended, limiting evaluation. No pathologic dilation of small or large bowel. Normal appendix. No evidence of acute bowel inflammation Vascular/Lymphatic: Aortic atherosclerosis. Smooth IVC contours. No pathologically enlarged abdominal or pelvic lymph nodes. Reproductive: Prior hysterectomy without new suspicious enhancing soft tissue nodularity along the vaginal cuff. Decreased size of the low-density focus along the left external iliac vessels measuring 12 x 7 mm on image 70/2 previously measuring 18 x 11 mm. Other: No significant abdominopelvic free fluid. Postsurgical change in the abdominal wall. Musculoskeletal: Probable postradiation change in the sacrum. No aggressive lytic or blastic lesion of bone. IMPRESSION: 1. Prior hysterectomy without new suspicious enhancing soft tissue nodularity along the vaginal cuff. 2. Decreased size of the low-density focus along the left external iliac vessels, nonspecific. Interval decrease in size somewhat reassuring, and this is favored to reflect a small lymphocele or loculated fluid. However in the context of ongoing chemotherapy the interval decrease in size may reflect treatment response and as such while not favored metastatic disease can not be  excluded. Continued attention on follow-up imaging is suggested. 3. No evidence of new or progressive disease in the abdomen or pelvis. 4. Diffuse hepatic steatosis. Electronically Signed   By: JDahlia BailiffM.D.   On: 08/14/2022 10:27

## 2022-08-15 NOTE — Assessment & Plan Note (Signed)
We reviewed her CBC I suspect she might have alpha thalassemia That would explain her intermittent borderline anemia and microcytosis In order to diagnose this, we have to order additional workup Ultimately, the patient is comfortable not to pursue further workup on this

## 2022-08-15 NOTE — Patient Instructions (Signed)
[place] [facility]  Discharge Instructions: Thank you for choosing [facility] to provide your oncology and hematology care.   If you have a lab appointment with the [facility], please go directly to the [facility] and check in at the registration area.   Wear comfortable clothing and clothing appropriate for easy access to any Portacath or PICC line.   We strive to give you quality time with your provider. You may need to reschedule your appointment if you arrive late (15 or more minutes).  Arriving late affects you and other patients whose appointments are after yours.  Also, if you miss three or more appointments without notifying the office, you may be dismissed from the clinic at the provider's discretion.      For prescription refill requests, have your pharmacy contact our office and allow 72 hours for refills to be completed.    Today you received the following chemotherapy and/or immunotherapy agents: Dostarlimab (Jempreli),   To help prevent nausea and vomiting after your treatment, we encourage you to take your nausea medication as directed.  BELOW ARE SYMPTOMS THAT SHOULD BE REPORTED IMMEDIATELY: *FEVER GREATER THAN 100.4 F (38 C) OR HIGHER *CHILLS OR SWEATING *NAUSEA AND VOMITING THAT IS NOT CONTROLLED WITH YOUR NAUSEA MEDICATION *UNUSUAL SHORTNESS OF BREATH *UNUSUAL BRUISING OR BLEEDING *URINARY PROBLEMS (pain or burning when urinating, or frequent urination) *BOWEL PROBLEMS (unusual diarrhea, constipation, pain near the anus) TENDERNESS IN MOUTH AND THROAT WITH OR WITHOUT PRESENCE OF ULCERS (sore throat, sores in mouth, or a toothache) UNUSUAL RASH, SWELLING OR PAIN  UNUSUAL VAGINAL DISCHARGE OR ITCHING   Items with * indicate a potential emergency and should be followed up as soon as possible or go to the Emergency Department if any problems should occur.  Please show the CHEMOTHERAPY ALERT CARD or IMMUNOTHERAPY ALERT  CARD at check-in to the Emergency Department and triage nurse.  Should you have questions after your visit or need to cancel or reschedule your appointment, please contact [facility]  Dept: [phone]  and follow the prompts.  Office hours are 8:00 a.m. to 4:30 p.m. Monday - Friday. Please note that voicemails left after 4:00 p.m. may not be returned until the following business day.  We are closed weekends and major holidays. You have access to a nurse at all times for urgent questions. Please call the main number to the clinic Dept: [phone] and follow the prompts.   For any non-urgent questions, you may also contact your provider using MyChart. We now offer e-Visits for anyone 54 and older to request care online for non-urgent symptoms. For details visit [email or web address].   Also download the MyChart app! Go to the app store, search "MyChart", open the app, select [facility], and log in with your MyChart username and password.

## 2022-08-15 NOTE — Assessment & Plan Note (Signed)
This is likely due to chemotherapy We discussed importance of lifestyle changes and dietary modification

## 2022-08-15 NOTE — Progress Notes (Signed)
[facility] CSW Progress Note  Holiday representative met with patient to provide support in infusion (immunotherapy). Patient is doing well overall. She is stressed with medical bills coming from different offices and trying to make payments. CSW reminded of potential [facility] assistance policies and to contact billing.  Reminded patient of other support programs including support group and FYNN class.    Wilkes Potvin E Leily Capek, LCSW

## 2022-08-17 ENCOUNTER — Other Ambulatory Visit (HOSPITAL_COMMUNITY): Payer: Self-pay

## 2022-08-17 ENCOUNTER — Telehealth: Payer: Self-pay

## 2022-08-17 ENCOUNTER — Other Ambulatory Visit: Payer: Self-pay

## 2022-08-17 LAB — T4: T4, Total: 7.1 ug/dL (ref 4.5–12.0)

## 2022-08-17 MED ORDER — LEVOTHYROXINE SODIUM 25 MCG PO TABS
25.0000 ug | ORAL_TABLET | Freq: Every day | ORAL | 1 refills | Status: DC
  Filled 2022-08-17: qty 30, 30d supply, fill #0

## 2022-08-17 NOTE — Telephone Encounter (Signed)
Called and given below message. She verbalized understanding and appreciated the call. Rx sent to preferred pharmacy.

## 2022-08-17 NOTE — Telephone Encounter (Signed)
-----   Message from [name], MD sent at 08/17/2022  7:58 AM EST ----- TSH is high, a side effect of immuunotherapy I recommend starting her on low dose synthroid Please call in 25 mcg po daily, 30 tabs 1 refill to her pharmacy

## 2022-08-21 ENCOUNTER — Telehealth: Payer: Self-pay

## 2022-08-21 NOTE — Telephone Encounter (Signed)
Returned her call. She left a message that she is having low energy and sleepy.  She just started Synthroid Friday. Told her the medication will take a while to start working. She verbalized understanding.

## 2022-08-22 ENCOUNTER — Telehealth: Payer: Self-pay

## 2022-08-22 ENCOUNTER — Other Ambulatory Visit (HOSPITAL_COMMUNITY): Payer: Self-pay

## 2022-08-22 ENCOUNTER — Encounter: Payer: Self-pay | Admitting: Gynecologic Oncology

## 2022-08-22 ENCOUNTER — Other Ambulatory Visit: Payer: Self-pay

## 2022-08-22 MED ORDER — SYNTHROID 25 MCG PO TABS
25.0000 ug | ORAL_TABLET | Freq: Every day | ORAL | 1 refills | Status: DC
  Filled 2022-08-22 – 2022-08-23 (×2): qty 30, 30d supply, fill #0
  Filled 2022-09-13: qty 30, 30d supply, fill #1

## 2022-08-22 NOTE — Telephone Encounter (Signed)
She called and left a long message. Last night she noticed that her Synthroid is generic. She is having side effects from the generic, such as headache and nausea. In the past when she took thyroid medication she took the brand name. She is willing to pay for the brand name Synthroid. In the past when she took synthroid she would take a 1/2 dose for 1 week or so to get use to the dose. She is asking if Dr. Alvy Bimler recommends that she take 1/2 dose?  Send brand name to pharmacy?

## 2022-08-22 NOTE — Telephone Encounter (Signed)
OK to send brand name I do not recommend 1/2 dose since she complained of fatigue recently

## 2022-08-22 NOTE — Telephone Encounter (Signed)
Called and told Rx sent to pharmacy. Given message from Dr. Alvy Bimler. She verbalized understanding and appreciated the call.

## 2022-08-23 ENCOUNTER — Other Ambulatory Visit (HOSPITAL_COMMUNITY): Payer: Self-pay

## 2022-08-24 ENCOUNTER — Telehealth: Payer: Self-pay | Admitting: Oncology

## 2022-08-24 NOTE — Telephone Encounter (Signed)
Left a message for [facility] billing regarding charge for MMR testing.  Requested a return call.

## 2022-08-29 ENCOUNTER — Telehealth: Payer: Self-pay | Admitting: Oncology

## 2022-08-29 NOTE — Telephone Encounter (Signed)
Staci Righter from [facility] called back and is going to check and fix the billing for the MMR charge on 06/23/2022.  She will call back once it is done.

## 2022-09-06 ENCOUNTER — Telehealth: Payer: Self-pay

## 2022-09-06 NOTE — Telephone Encounter (Signed)
Pt called and LVM regarding a bill she received for "about $64,000". She is asking what this charge is. Returned pt's call and LVM providing contact information for Ulice Dash Asst Director Revenue Cycle.

## 2022-09-13 ENCOUNTER — Other Ambulatory Visit: Payer: Self-pay

## 2022-09-18 ENCOUNTER — Other Ambulatory Visit (HOSPITAL_COMMUNITY): Payer: Self-pay

## 2022-09-26 ENCOUNTER — Encounter: Payer: Self-pay | Admitting: Hematology and Oncology

## 2022-09-26 ENCOUNTER — Inpatient Hospital Stay: Payer: 59 | Attending: Gynecologic Oncology

## 2022-09-26 ENCOUNTER — Inpatient Hospital Stay: Payer: 59 | Admitting: Licensed Clinical Social Worker

## 2022-09-26 ENCOUNTER — Telehealth: Payer: Self-pay

## 2022-09-26 ENCOUNTER — Other Ambulatory Visit: Payer: 59

## 2022-09-26 ENCOUNTER — Inpatient Hospital Stay: Payer: 59

## 2022-09-26 ENCOUNTER — Inpatient Hospital Stay (HOSPITAL_BASED_OUTPATIENT_CLINIC_OR_DEPARTMENT_OTHER): Payer: 59 | Admitting: Hematology and Oncology

## 2022-09-26 VITALS — BP 152/82 | HR 82 | Temp 99.3°F | Resp 18 | Ht 63.0 in | Wt 138.4 lb

## 2022-09-26 DIAGNOSIS — R718 Other abnormality of red blood cells: Secondary | ICD-10-CM | POA: Diagnosis not present

## 2022-09-26 DIAGNOSIS — Q631 Lobulated, fused and horseshoe kidney: Secondary | ICD-10-CM | POA: Diagnosis not present

## 2022-09-26 DIAGNOSIS — C539 Malignant neoplasm of cervix uteri, unspecified: Secondary | ICD-10-CM | POA: Diagnosis not present

## 2022-09-26 DIAGNOSIS — Z79620 Long term (current) use of immunosuppressive biologic: Secondary | ICD-10-CM | POA: Diagnosis not present

## 2022-09-26 DIAGNOSIS — S3141XA Laceration without foreign body of vagina and vulva, initial encounter: Secondary | ICD-10-CM | POA: Diagnosis not present

## 2022-09-26 DIAGNOSIS — R109 Unspecified abdominal pain: Secondary | ICD-10-CM | POA: Insufficient documentation

## 2022-09-26 DIAGNOSIS — E039 Hypothyroidism, unspecified: Secondary | ICD-10-CM | POA: Diagnosis not present

## 2022-09-26 DIAGNOSIS — Z5112 Encounter for antineoplastic immunotherapy: Secondary | ICD-10-CM | POA: Insufficient documentation

## 2022-09-26 DIAGNOSIS — Z79899 Other long term (current) drug therapy: Secondary | ICD-10-CM | POA: Insufficient documentation

## 2022-09-26 DIAGNOSIS — Z79890 Hormone replacement therapy (postmenopausal): Secondary | ICD-10-CM | POA: Insufficient documentation

## 2022-09-26 DIAGNOSIS — C55 Malignant neoplasm of uterus, part unspecified: Secondary | ICD-10-CM | POA: Diagnosis present

## 2022-09-26 LAB — CBC WITH DIFFERENTIAL (CANCER CENTER ONLY)
Abs Immature Granulocytes: 0.01 10*3/uL (ref 0.00–0.07)
Basophils Absolute: 0 10*3/uL (ref 0.0–0.1)
Basophils Relative: 0 %
Eosinophils Absolute: 0.1 10*3/uL (ref 0.0–0.5)
Eosinophils Relative: 1 %
HCT: 36.6 % (ref 36.0–46.0)
Hemoglobin: 12.1 g/dL (ref 12.0–15.0)
Immature Granulocytes: 0 %
Lymphocytes Relative: 37 %
Lymphs Abs: 2.3 10*3/uL (ref 0.7–4.0)
MCH: 25.7 pg — ABNORMAL LOW (ref 26.0–34.0)
MCHC: 33.1 g/dL (ref 30.0–36.0)
MCV: 77.7 fL — ABNORMAL LOW (ref 80.0–100.0)
Monocytes Absolute: 0.4 10*3/uL (ref 0.1–1.0)
Monocytes Relative: 7 %
Neutro Abs: 3.3 10*3/uL (ref 1.7–7.7)
Neutrophils Relative %: 55 %
Platelet Count: 158 10*3/uL (ref 150–400)
RBC: 4.71 MIL/uL (ref 3.87–5.11)
RDW: 14.5 % (ref 11.5–15.5)
WBC Count: 6.1 10*3/uL (ref 4.0–10.5)
nRBC: 0.3 % — ABNORMAL HIGH (ref 0.0–0.2)

## 2022-09-26 LAB — CMP (CANCER CENTER ONLY)
ALT: 31 U/L (ref 0–44)
AST: 27 U/L (ref 15–41)
Albumin: 4.3 g/dL (ref 3.5–5.0)
Alkaline Phosphatase: 63 U/L (ref 38–126)
Anion gap: 9 (ref 5–15)
BUN: 14 mg/dL (ref 6–20)
CO2: 25 mmol/L (ref 22–32)
Calcium: 9.6 mg/dL (ref 8.9–10.3)
Chloride: 106 mmol/L (ref 98–111)
Creatinine: 0.82 mg/dL (ref 0.44–1.00)
GFR, Estimated: >60 mL/min (ref >60)
Glucose, Bld: 132 mg/dL — ABNORMAL HIGH (ref 70–99)
Potassium: 4 mmol/L (ref 3.5–5.1)
Sodium: 140 mmol/L (ref 135–145)
Total Bilirubin: 0.4 mg/dL (ref 0.3–1.2)
Total Protein: 6.8 g/dL (ref 6.5–8.1)

## 2022-09-26 LAB — TSH: TSH: 4.713 u[IU]/mL — ABNORMAL HIGH (ref 0.350–4.500)

## 2022-09-26 MED ORDER — SODIUM CHLORIDE 0.9 % IV SOLN
1000.0000 mg | Freq: Once | INTRAVENOUS | Status: AC
  Administered 2022-09-26: 1000 mg via INTRAVENOUS
  Filled 2022-09-26: qty 20

## 2022-09-26 MED ORDER — SODIUM CHLORIDE 0.9 % IV SOLN: Freq: Once | INTRAVENOUS | Status: AC

## 2022-09-26 NOTE — Assessment & Plan Note (Signed)
I reviewed imaging studies again with the patient She will proceed with treatment as scheduled Her next imaging study will be in August

## 2022-09-26 NOTE — Assessment & Plan Note (Signed)
We reviewed her CBC I suspect she might have alpha thalassemia In order to diagnose this, we have to order additional workup Ultimately, the patient is comfortable not to pursue further workup on this

## 2022-09-26 NOTE — Telephone Encounter (Signed)
-----   Message from Artis Delay, MD sent at 09/26/2022  2:23 PM EDT ----- Pls let her know TSH is still low but much better No need to change dose of synthroid She sent mychart message about nucleated rbc. It is only important to worry about if she has anemia. She is not anemic. I can review with her in her next visit

## 2022-09-26 NOTE — Patient Instructions (Signed)
[facility]  Discharge Instructions: Thank you for choosing [facility] to provide your oncology and hematology care.   If you have a lab appointment with the Cancer Center, please go directly to the Cancer Center and check in at the registration area.   Wear comfortable clothing and clothing appropriate for easy access to any Portacath or PICC line.   We strive to give you quality time with your provider. You may need to reschedule your appointment if you arrive late (15 or more minutes).  Arriving late affects you and other patients whose appointments are after yours.  Also, if you miss three or more appointments without notifying the office, you may be dismissed from the clinic at the provider's discretion.      For prescription refill requests, have your pharmacy contact our office and allow 72 hours for refills to be completed.    Today you received the following chemotherapy and/or immunotherapy agents Jemperli To help prevent nausea and vomiting after your treatment, we encourage you to take your nausea medication as directed.  BELOW ARE SYMPTOMS THAT SHOULD BE REPORTED IMMEDIATELY: *FEVER GREATER THAN 100.4 F (38 C) OR HIGHER *CHILLS OR SWEATING *NAUSEA AND VOMITING THAT IS NOT CONTROLLED WITH YOUR NAUSEA MEDICATION *UNUSUAL SHORTNESS OF BREATH *UNUSUAL BRUISING OR BLEEDING *URINARY PROBLEMS (pain or burning when urinating, or frequent urination) *BOWEL PROBLEMS (unusual diarrhea, constipation, pain near the anus) TENDERNESS IN MOUTH AND THROAT WITH OR WITHOUT PRESENCE OF ULCERS (sore throat, sores in mouth, or a toothache) UNUSUAL RASH, SWELLING OR PAIN  UNUSUAL VAGINAL DISCHARGE OR ITCHING   Items with * indicate a potential emergency and should be followed up as soon as possible or go to the Emergency Department if any problems should occur.  Please show the CHEMOTHERAPY ALERT CARD or IMMUNOTHERAPY ALERT CARD at check-in to  the Emergency Department and triage nurse.  Should you have questions after your visit or need to cancel or reschedule your appointment, please contact [facility]  Dept: [phone]  and follow the prompts.  Office hours are 8:00 a.m. to 4:30 p.m. Monday - Friday. Please note that voicemails left after 4:00 p.m. may not be returned until the following business day.  We are closed weekends and major holidays. You have access to a nurse at all times for urgent questions. Please call the main number to the clinic Dept: [phone] and follow the prompts.   For any non-urgent questions, you may also contact your provider using MyChart. We now offer e-Visits for anyone 18 and older to request care online for non-urgent symptoms. For details visit [email or web address].   Also download the MyChart app! Go to the app store, search "MyChart", open the app, select [facility], and log in with your MyChart username and password.  

## 2022-09-26 NOTE — Progress Notes (Signed)
Pt request to not use her PAC today for treatment, request PIV instead.

## 2022-09-26 NOTE — Progress Notes (Signed)
Tiffany [name]  in the office today and concerned about monthly billing for treatment. She is most concerned about the pharmacy charge of [phone]. The bill she brought to the office was dated 06/23/22. I called the billing department and they are emailing her a itemized statement. The largest charge was for Jemperli that was $29,861.00. I told her the above.  Sent message to financial asking if they call call [facility] to see if they can be of assistance.

## 2022-09-26 NOTE — Assessment & Plan Note (Signed)
We discussed expected side effects of immunotherapy including intermittent changes to her thyroid function I will adjust the dose of her treatment accordingly

## 2022-09-26 NOTE — Progress Notes (Signed)
[facility] CSW Progress Note  Visual merchandiser met with patient in infusion for ongoing support. Pt has another high bill related to immunotherapy- she was connected with pharmacy [facility]) who is assisting with application for assistance through manufacturer. Discussed social security disability and interplay with long term disability as her LTD required she apply for SSDI.  Pt is hopeful to return to work in the next few months if she experiences physical improvements.  Otherwise, pt is doing fairly well. She has been attending some programs through [facility] and [facility] and CSW sent updated April calendar today.  Pt is looking forward to seeing her grandchild soon as well.    Bradden Tadros E Argus Caraher, LCSW

## 2022-09-26 NOTE — Telephone Encounter (Signed)
Called and left below message. Ask her to call the office back to discuss if needed.

## 2022-09-26 NOTE — Progress Notes (Signed)
[facility] OFFICE PROGRESS NOTE  Patient Care Team: Daisy Florooss, Charles Alan, MD as PCP - General (Family Medicine)  ASSESSMENT & PLAN:  Uterine cancer [facility](HCC) I reviewed imaging studies again with the patient She will proceed with treatment as scheduled Her next imaging study will be in August  Microcytosis We reviewed her CBC I suspect she might have alpha thalassemia In order to diagnose this, we have to order additional workup Ultimately, the patient is comfortable not to pursue further workup on this  Abdominal discomfort She has intermittent abdominal discomfort/rectal discomfort that comes and goes I recommend the patient to pursue colonoscopy for further evaluation  Hypothyroidism We discussed expected side effects of immunotherapy including intermittent changes to her thyroid function I will adjust the dose of her treatment accordingly  No orders of the defined types were placed in this encounter.   All questions were answered. The patient knows to call the clinic with any problems, questions or concerns. The total time spent in the appointment was 30 minutes encounter with patients including review of chart and various tests results, discussions about plan of care and coordination of care plan   Artis DelayNi Chrishonda Hesch, MD 09/26/2022 8:49 AM  INTERVAL HISTORY: Please see below for problem oriented charting. she returns for treatment follow-up  She has several questions The patient is convinced that if her GYN physician would have order ultrasound with each Pap smear, her cancer could be avoided She complained of intermittent abdominal discomfort/rectal discomfort She is wondering whether CT imaging and MRI is adequate to replace the role of colonoscopy We also discussed side effects of immunotherapy including changes in her thyroid function Otherwise, she tolerated immunotherapy well Her hair has started to grow back She has questions related to the definition of  microcytosis  REVIEW OF SYSTEMS:   Constitutional: Denies fevers, [name], nose, mouth, throat, [name], [name], chest discomfort or lower extremity swelling Gastrointestinal:  Denies nausea, heartburn or change in bowel habits Skin: Denies abnormal skin rashes Lymphatics: Denies new lymphadenopathy or easy bruising Neurological:Denies numbness, tingling or new weaknesses Behavioral/Psych: Mood is stable, no new changes  All other systems were reviewed with the patient and are negative.  I have reviewed the past medical history, past surgical history, social history and family history with the patient and they are unchanged from previous note.  ALLERGIES:  is allergic to seasonal ic [octacosanol], chlorhexidine gluconate, and latex.  MEDICATIONS:  Current Outpatient Medications  Medication Sig Dispense Refill   Cholecalciferol (VITAMIN D3 PO) Take 1 tablet by mouth daily.     Multiple Vitamin (MULTIVITAMIN WITH MINERALS) TABS tablet Take 1 tablet by mouth daily.     SYNTHROID 25 MCG tablet Take 1 tablet (25 mcg total) by mouth daily before breakfast. 30 tablet 1   No current facility-administered medications for this visit.   Facility-Administered Medications Ordered in Other Visits  Medication Dose Route Frequency Provider Last Rate Last Admin   0.9 %  sodium chloride infusion   Intravenous Once Bertis RuddyGorsuch, Jensine Luz, MD       dostarlimab-gxly (JEMPERLI) 1,000 mg in sodium chloride 0.9 % 100 mL (8.3333 mg/mL) chemo infusion  1,000 mg Intravenous Once Oz Gammel, MD       sodium chloride flush (NS) 0.9 % injection 10 mL  10 mL Intracatheter Once Artis DelayGorsuch, Redith Drach, MD  SUMMARY OF ONCOLOGIC HISTORY: Oncology History Overview Note  HER2 negative, Neg Genetics MMR Normal P53 positive   Uterine cancer  12/03/2021 Initial  Diagnosis   Patient reports several month history of intermittent pelvic pain that she describes as discomfort or sometimes cramping, that has worsened with time.  She notes that this is tolerable.  She began having postmenopausal bleeding started on 6/17 with a few spots of blood.     12/29/2021 Imaging   1. Bilateral large multiloculated cystic lesions of the ovaries, concerning for ovarian malignancy. Recommend gynecologic consultation and pelvic ultrasound and/or pelvic MRI with contrast for further evaluation. 2. Large heterogeneous mass of the posterior pelvis likely arising from the lower uterus, possibly a fibroid, although malignancy is also a concern. Recommend attention on pelvic ultrasound or MRI as above. 3. Additional mass arising from the right uterine fundus which correlates with fibroid described on prior 2015 ultrasound. 4. Nonspecific small ground-glass nodule of the right lower lobe measuring 6 mm. Recommend follow-up chest CT in 6 months for further evaluation. 5. Aortic Atherosclerosis (ICD10-I70.0).   12/30/2021 Pathology Results   A. CERVIX, BIOPSY:  -  Scantly cellular specimen with predominantly blood, fibrin and acute inflammatory infiltrate but with scattered highly atypical cells highly suspicious for malignancy.   Note: A p16 shows strong positivity in these scattered cells; however, while p53 is mildly increased definitive strong overexpression or under expression is not identified.  These findings support the suspicion for malignancy; however, are insufficient for a definitive diagnosis.     01/03/2022 Imaging   MRI pelvis  1. Large mixed solid and cystic lesions of the bilateral ovaries, measuring 8.4 x 7.0 x 5.4 cm on the right and 10.3 x 8.7 x 8.1 cm on the left, highly concerning for primary ovarian malignancy and contralateral ovarian metastasis.   2. Homogeneously enhancing mass arising from the right aspect of the uterine body and fundus measuring 6.0 x 5.5 x  4.3 cm, consistent with a uterine fibroid and similar to prior ultrasound dated 2015.   3. An additional, much more heterogeneous mass which appears to arise from the cervix or posterior lower uterine segment measuring 8.1 x 6.4 x 6.2 cm. This may reflect an additional fibroid with internal degeneration, however this was not clearly visualized on ultrasound dated 2015 and is highly suspicious for a cervical mass as significant enlargement of benign fibroids is not expected following menopause. This mass appears to closely abut and compress or perhaps even directly involve the adjacent rectum.   4.  No evidence of lymphadenopathy in the pelvis.   01/12/2022 PET scan   1. Hypermetabolic heterogeneous mass centered on the cervix/lower uterine segment is most consistent with primary gynecologic neoplasm. 2. Multiloculated complex bilateral adnexal cystic lesions with hypermetabolic soft tissue component likely reflect metastatic lesions. 3. No hypermetabolic abdominopelvic adenopathy. 4. No evidence of hypermetabolic metastatic disease in the chest or abdomen.   01/18/2022 Pathology Results   FINAL MICROSCOPIC DIAGNOSIS:   A. CERVICAL MASS, BIOPSIES:  - Poorly differentiated carcinoma with necrosis, consistent with high-grade serous carcinoma, see comment   B. CERVICAL MASS, EXCISION:  - Poorly differentiated carcinoma with necrosis, consistent with high-grade serous carcinoma see comment  - Edges of resected tissue fragments are positive for carcinoma   COMMENT:   A and B.   The tumor cells are positive for PAX8, ER and p16 immunostains.  Immunostain for p53 shows significant overexpression. Immunostain for CK5/6 shows weak focal labeling while immunostain for  p63  is negative.  The immunoprofile is consistent with above interpretation.  The carcinoma is likely endometrial or ovarian origin with secondary involvement of the cervix.     01/23/2022 Initial Diagnosis   Uterine cancer (HCC)    01/23/2022 Cancer Staging   Staging form: Corpus Uteri - Carcinoma and Carcinosarcoma, AJCC 8th Edition - Clinical stage from 01/23/2022: FIGO Stage IIIA (cT3a, cN0, cM0) - Signed by Artis Delay, MD on 01/23/2022 Stage prefix: Initial diagnosis   01/31/2022 - 01/31/2022 Chemotherapy   Patient is on Treatment Plan : UTERINE ENDOMETRIAL Dostarlimab-gxly (500 mg) + Carboplatin (AUC 5) + Paclitaxel (175 mg/m2) q21d x 6 cycles / Dostarlimab-gxly (1000 mg) q42d x 6 cycles      01/31/2022 -  Chemotherapy   Patient is on Treatment Plan : UTERINE ENDOMETRIAL Dostarlimab-gxly (500 mg) + Carboplatin (AUC 5) + Paclitaxel (175 mg/m2) q21d x 6 cycles / Dostarlimab-gxly (1000 mg) q42d x 6 cycles      04/14/2022 PET scan   1. Significant interval positive response to therapy. Mildly hypermetabolic solid 5.5 x 2.8 cm posterior uterine cervix mass and mildly hypermetabolic bilateral adnexal mixed cystic and solid masses are all significantly decreased in size and metabolism. 2. No new or progressive hypermetabolic metastatic disease. 3. Low level FDG uptake associated with healing subacute sclerotic nondisplaced anterior left third, fourth and fifth rib fractures without associated discrete osseous lesions, correlate for history of interval injury in this location. 4. Horseshoe kidney.   05/03/2022 Surgery   Robotic-assisted laparoscopic total hysterectomy with bilateral salpingo-oophorectomy, tumor debulking including infracolic omentectomy, mini-lap, cystoscopy, and vaginal laceration repair   Findings:  On speculum exam, normal-appearing cervix that is flush with the vaginal apex.  No visible cervical lesion.  On EUA, moderately mobile, enlarged uterus with mobile fullness appreciated in the cul-de-sac.  On intra-abdominal entry, normal upper abdominal survey.  Some adhesions of the omentum to the anterior abdominal wall below the umbilicus were noted.  Normal-appearing omentum, small and large bowel.  No ascites.   Right ovary minimally enlarged but overall normal in appearance.  Left ovary replaced by a 6 cm cystic mass, smooth.  Normal-appearing bilateral fallopian tubes.  Bladder was somewhat adherent anteriorly to the cervix.  Uterus approximately 8 cm and deviated to the left given a 10 cm anterior and right-sided fibroid.  The cervix itself did not appear enlarged, either on intra-abdominal phonation or speculum exam.  Posteriorly, a white ridge of somewhat indurated tissue was noted underneath the cervix and just above the rectum.  On palpation, this ridge could be appreciated only on rectovaginal exam.  It was separate and free from the cervix itself.  It was difficult to distinguish whether this was treated tumor or active tumor although its detached location from the cervix and uterus was noted.  Given my conversation with the patient prior to surgery and that she would not want an ostomy (even temporarily), there was no way to resect this area out without significant risk of damage to the rectum.  Given its location, even in the setting of a bowel prep (which she had not had), she would likely require at least a protective diverting ostomy. Cystoscopy, bladder dome noted to be intact and good efflux seen from bilateral ureteral orifices.   05/03/2022 Pathology Results   A. UTERUS, CERVIX, BILATERAL TUBES AND OVARIES, RESECTION:  - Cervix      Nabothian cysts      Negative for malignancy  - Endometrium      Inactive endometrium  Two benign endometrial polyps      Negative for malignancy  - Myometrium      Leiomyoma      Negative for malignancy  - Right ovary      Hemorrhagic cyst with fibrosis, hemosiderin and focal calcification      Negative for malignancy  - Left ovary      Hemorrhagic cyst with fibrosis, hemosiderin and focal calcification      Negative for malignancy  - Bilateral fallopian tubes      Unremarkable      Negative for malignancy  - See oncology table   B. OMENTUM,  EXCISION:  - Benign omental adipose tissue.  - Negative for malignancy.     Genetic Testing   Negative genetic testing. No pathogenic variants identified on the [facility] CancerNext-Expanded+RNA panel. VUS in SDHA called [id number] identified. The report date is 05/03/2022.  The CancerNext-Expanded + RNAinsight gene panel offered by [employer] and includes sequencing and rearrangement analysis for the following 77 genes: IP, ALK, APC*, ATM*, AXIN2, BAP1, BARD1, BLM, BMPR1A, BRCA1*, BRCA2*, BRIP1*, CDC73, CDH1*,CDK4, CDKN1B, CDKN2A, CHEK2*, CTNNA1, DICER1, FANCC, FH, FLCN, GALNT12, KIF1B, LZTR1, MAX, MEN1, MET, MLH1*, MSH2*, MSH3, MSH6*, MUTYH*, NBN, NF1*, NF2, NTHL1, PALB2*, PHOX2B, PMS2*, POT1, PRKAR1A, PTCH1, PTEN*, RAD51C*, RAD51D*,RB1, RECQL, RET, SDHA, SDHAF2, SDHB, SDHC, SDHD, SMAD4, SMARCA4, SMARCB1, SMARCE1, STK11, SUFU, TMEM127, TP53*,TSC1, TSC2, VHL and XRCC2 (sequencing and deletion/duplication); EGFR, EGLN1, HOXB13, KIT, MITF, PDGFRA, POLD1 and POLE (sequencing only); EPCAM and GREM1 (deletion/duplication only).   08/14/2022 Imaging   1. Prior hysterectomy without new suspicious enhancing soft tissue nodularity along the vaginal cuff. 2. Decreased size of the low-density focus along the left external iliac vessels, nonspecific. Interval decrease in size somewhat reassuring, and this is favored to reflect a small lymphocele or loculated fluid. However in the context of ongoing chemotherapy the interval decrease in size may reflect treatment response and as such while not favored metastatic disease can not be excluded. Continued attention on follow-up imaging is suggested. 3. No evidence of new or progressive disease in the abdomen or pelvis. 4. Diffuse hepatic steatosis.     PHYSICAL EXAMINATION: ECOG PERFORMANCE STATUS: 1 - Symptomatic but completely ambulatory  Vitals:   09/26/22 0812  BP: (!) 152/82  Pulse: 82  Resp: 18  Temp: 99.3 F (37.4 C)  SpO2: 100%   Filed Weights    09/26/22 0812  Weight: 138 lb 6.4 oz (62.8 kg)    GENERAL:alert, no distress and comfortable  NEURO: alert & oriented x 3 with fluent speech, no focal motor/sensory deficits  LABORATORY DATA:  I have reviewed the data as listed    Component Value Date/Time   NA 140 09/26/2022 0757   K 4.0 09/26/2022 0757   CL 106 09/26/2022 0757   CO2 25 09/26/2022 0757   GLUCOSE 132 (H) 09/26/2022 0757   BUN 14 09/26/2022 0757   CREATININE 0.82 09/26/2022 0757   CALCIUM 9.6 09/26/2022 0757   PROT 6.8 09/26/2022 0757   ALBUMIN 4.3 09/26/2022 0757   AST 27 09/26/2022 0757   ALT 31 09/26/2022 0757   ALKPHOS 63 09/26/2022 0757   BILITOT 0.4 09/26/2022 0757   GFRNONAA >60 09/26/2022 0757    No results found for: "SPEP", "UPEP"  Lab Results  Component Value Date   WBC 6.1 09/26/2022   NEUTROABS 3.3 09/26/2022   HGB 12.1 09/26/2022   HCT 36.6 09/26/2022   MCV 77.7 (L) 09/26/2022   PLT 158 09/26/2022  Chemistry      Component Value Date/Time   NA 140 09/26/2022 0757   K 4.0 09/26/2022 0757   CL 106 09/26/2022 0757   CO2 25 09/26/2022 0757   BUN 14 09/26/2022 0757   CREATININE 0.82 09/26/2022 0757      Component Value Date/Time   CALCIUM 9.6 09/26/2022 0757   ALKPHOS 63 09/26/2022 0757   AST 27 09/26/2022 0757   ALT 31 09/26/2022 0757   BILITOT 0.4 09/26/2022 0757

## 2022-09-26 NOTE — Assessment & Plan Note (Signed)
She has intermittent abdominal discomfort/rectal discomfort that comes and goes I recommend the patient to pursue colonoscopy for further evaluation

## 2022-09-27 ENCOUNTER — Other Ambulatory Visit: Payer: Self-pay

## 2022-09-27 ENCOUNTER — Telehealth: Payer: Self-pay

## 2022-09-27 DIAGNOSIS — R109 Unspecified abdominal pain: Secondary | ICD-10-CM

## 2022-09-27 LAB — T4: T4, Total: 7.3 ug/dL (ref 4.5–12.0)

## 2022-09-27 NOTE — Telephone Encounter (Signed)
Her previous GI doctor is at [facility] Please send a new referral to [facility] GI practice

## 2022-09-27 NOTE — Progress Notes (Signed)
GI referral ordered and faxed to [facility] at [phone] per MD with receipt confirmation. Pt aware.

## 2022-09-27 NOTE — Telephone Encounter (Signed)
Returned Pt's call regarding colonoscopy. Pt's PCP will not order/refer for colonoscopy without seeing Pt and their first available appt is in June. Pt reports abdominal pain/rectal discomfort and doesn't want to wait until June. Pt asking if we can give referral to GI for colonoscopy. Message relayed to MD, Pt verbalized understanding.

## 2022-09-29 ENCOUNTER — Telehealth: Payer: Self-pay

## 2022-09-29 NOTE — Telephone Encounter (Signed)
Returned her call. Left a message asking her to call the office back. She is complaining of pain on the right side of pelvic bone area.

## 2022-09-29 NOTE — Telephone Encounter (Signed)
Called and given below message. She verbalized understanding and will call the office back for questions. Called [facility] and spoke with scheduler. They will call Mariya to schedule appt. Given message to [facility].

## 2022-09-29 NOTE — Telephone Encounter (Signed)
She called back. The pain to the right side of pelvic bone is intermittent. She states that she can tolerate the pain. She is just concerned and worry's about the reason for the pain/ discomfort. She called [facility] about scheduling colonoscopy and MD is reviewing referral. They will call her about appt. Told her I would follow up with GI about appt.  She is asking if Dr. Bertis Ruddy can order more tests or has other recommendations?

## 2022-09-29 NOTE — Telephone Encounter (Signed)
There are no more tests I could think of

## 2022-10-03 ENCOUNTER — Telehealth: Payer: Self-pay

## 2022-10-03 ENCOUNTER — Encounter: Payer: Self-pay | Admitting: Hematology and Oncology

## 2022-10-03 NOTE — Telephone Encounter (Signed)
Returned her call. Will forward message to Drug assistance to assist with co-pay for January bill per her request.

## 2022-10-03 NOTE — Telephone Encounter (Signed)
Pt called office stating she is having pain in her pelvic bone area.   Abdominal/Pelvic pain triage protocol reviewed with patient.   It started 1 week to 10 days ago on the right side in the pelvic bone area. Intermittent  only when sits. 6-7/10 on pain scale. Dull discomfort sometimes itchy but no rash/bumps.Laying down/standing or walking makes it better because there is no pressure applied. No vaginal bleeding, normal BM's, eating and drinking fine. No UTI S&S (pain, pressure, burning or frequency). No appetite changes except since January she does not eat red meat.  She states she mentioned this to Dr.Gorsuch (see note) and she has referred her to [facility]. Pt has tried to call them and has not heard back. Her last Colonoscopy was 2016. She will call GI again today to get in with them. She just wanted advice from Dr. Pricilla Holm or Warner Mccreedy NP.

## 2022-10-04 ENCOUNTER — Telehealth: Payer: Self-pay

## 2022-10-04 ENCOUNTER — Encounter: Payer: Self-pay | Admitting: Hematology and Oncology

## 2022-10-04 NOTE — Telephone Encounter (Signed)
Pt called and states she called [facility] for appt per referral. [facility] advised if her situation were urgent she could be seen sooner but right now urgent appts are priority. Pt states she feels her sx are urgent since she is having pain and she is a cancer pt. Attempted to return pt's call to further discuss. Routing message to MD and supporting RN.

## 2022-10-04 NOTE — Telephone Encounter (Signed)
Pt states there is nothing on the outside of her skin. No redness/rash/bumps. Pain is definitely on the inside. Muscular. She has not tried any heat therapy but will as well as Solicitor. She was thankful for the follow up call.

## 2022-10-04 NOTE — Telephone Encounter (Signed)
Are these symptoms superficial? The pruritus sounds like they are symptoms on the skin over the pubic bone. If so, is this area red at all? Could try nystatin powder if so. Has she tried any heat therapy and/or stretching if this is a deeper discomfort?

## 2022-10-05 ENCOUNTER — Encounter: Payer: Self-pay | Admitting: Hematology and Oncology

## 2022-10-05 NOTE — Telephone Encounter (Signed)
Vernona Rieger,  I am not sure what you meant In any case, I have no control over [facility] schedule

## 2022-10-09 ENCOUNTER — Encounter: Payer: Self-pay | Admitting: *Deleted

## 2022-10-09 ENCOUNTER — Telehealth: Payer: Self-pay

## 2022-10-09 NOTE — Telephone Encounter (Signed)
Called per her request. She is concerned that she does not have appt with GI. Per office staff they have offered her appts. The earliest they have is 5/30 at 9 am and she will be on the cancellation list. [facility] will call and offer her the appt on 5/30.

## 2022-10-09 NOTE — Telephone Encounter (Signed)
Returned her call. She is asking if [facility] sent attending physician statement and medical records request. Told her that I heard back from disability dept and we have not received any paper work. Given office fax # to have [employer] send paper work Told her that disability dept will email her a release form via email. She verbalized understanding.

## 2022-10-13 ENCOUNTER — Telehealth: Payer: Self-pay

## 2022-10-13 ENCOUNTER — Other Ambulatory Visit (HOSPITAL_COMMUNITY): Payer: Self-pay

## 2022-10-13 ENCOUNTER — Other Ambulatory Visit: Payer: Self-pay | Admitting: Hematology and Oncology

## 2022-10-13 ENCOUNTER — Encounter: Payer: Self-pay | Admitting: Licensed Clinical Social Worker

## 2022-10-13 MED ORDER — SYNTHROID 25 MCG PO TABS
25.0000 ug | ORAL_TABLET | Freq: Every day | ORAL | 1 refills | Status: DC
  Filled 2022-10-13: qty 30, 30d supply, fill #0

## 2022-10-13 NOTE — Progress Notes (Signed)
[facility] CSW Progress Note  Clinical Child psychotherapist received e-mail from pt reporting that her job has been terminated (she has been on long-term disability) and that she will lose her benefits and insurance at the end of May.  She inquired about assistance finding a new job.  CSW sent resources for job search as well as information on how to find other health insurance (applying for [employer], COBRA, insurance through the healthcare marketplace).      Tayari Yankee E Misheel Gowans, LCSW

## 2022-10-13 NOTE — Telephone Encounter (Signed)
Returned her call. Her job has been terminated and that she will lose her benefits and insurance at the end of May. She has already contact the Child psychotherapist, Marcelino Duster and received info on applying for [employer] and [employer] and job information.  Sent message to [place] with drug assistance to see if she has any assistance to offer her.  Tiffany [name] is scheduled to see [facility] on 5/30 for appt and then will be scheduled for colonoscopy. She is wanting earlier appt with [facility]. Told her that I have called them multiple times already and 5/30 was the earliest appt available. Instructed her to call [facility] and get on cancellation list and explain her situation. She verbalized understanding.  FYI

## 2022-10-16 ENCOUNTER — Telehealth: Payer: Self-pay

## 2022-10-16 NOTE — Telephone Encounter (Signed)
Returned her call. She is still losing her insurance and the end on May. She will drop off a letter stting this to forward to [place] with drug assistance.  Her long term disability will continue. Forwarded a message per her request to disability department asking them to look out for the paper work.

## 2022-10-19 ENCOUNTER — Other Ambulatory Visit (HOSPITAL_COMMUNITY): Payer: Self-pay

## 2022-10-19 MED ORDER — PEG 3350-KCL-NA BICARB-NACL 420 G PO SOLR
4000.0000 mL | ORAL | 0 refills | Status: DC
  Filled 2022-10-19 – 2022-11-07 (×2): qty 4000, 1d supply, fill #0

## 2022-10-19 MED ORDER — BISACODYL 5 MG PO TBEC
20.0000 mg | DELAYED_RELEASE_TABLET | ORAL | 0 refills | Status: DC
  Filled 2022-10-19 – 2022-11-07 (×2): qty 4, 1d supply, fill #0

## 2022-10-20 ENCOUNTER — Telehealth: Payer: Self-pay

## 2022-10-20 NOTE — Telephone Encounter (Signed)
Returned her call. She is on the market place trying to get insurance. She is looking at [facility] health EPO plan. Given NPI: [id number]. TAX ID# [id number] and told her to give to agent to make sure we are in network. She verbalized understanding.

## 2022-10-24 ENCOUNTER — Other Ambulatory Visit: Payer: Self-pay | Admitting: Hematology and Oncology

## 2022-10-24 ENCOUNTER — Telehealth: Payer: Self-pay

## 2022-10-24 ENCOUNTER — Encounter: Payer: Self-pay | Admitting: Gynecologic Oncology

## 2022-10-24 DIAGNOSIS — C539 Malignant neoplasm of cervix uteri, unspecified: Secondary | ICD-10-CM

## 2022-10-24 NOTE — Telephone Encounter (Signed)
PLease schedule CT to be done by 5/17

## 2022-10-24 NOTE — Telephone Encounter (Signed)
Returned her call. She is still having the pain that is intermittent in the right side of her rectum area that goes towards her vagina. The pain bothers her when she is sitting. Sleeping and standing she does not have any pain. She is worried and concerned that the colonoscopy that is scheduled on 5/9 will not show anything. She keeps looking at the MRI results on 12/14 and the CT on 2/26. She is worried that her cancer is back.  She is asking if she can have appt to talk about everything.

## 2022-10-24 NOTE — Telephone Encounter (Signed)
Called and given below message and radiology scheduling #. She will call to schedule and verbalized understanding.

## 2022-10-24 NOTE — Telephone Encounter (Signed)
Notified Patient of completion of Disability Attending Physician's Statement. Request for records forwarded to [employer] [facility].  Fax transmission confirmation received. Copy of form mailed to Patient as requested. No other needs or concerns voiced at this time.

## 2022-10-30 ENCOUNTER — Other Ambulatory Visit (HOSPITAL_COMMUNITY): Payer: Self-pay

## 2022-11-01 ENCOUNTER — Ambulatory Visit (HOSPITAL_BASED_OUTPATIENT_CLINIC_OR_DEPARTMENT_OTHER)
Admission: RE | Admit: 2022-11-01 | Discharge: 2022-11-01 | Disposition: A | Discharge status: Home / Self Care | Payer: 59 | Source: Ambulatory Visit | Attending: Hematology and Oncology | Admitting: Hematology and Oncology

## 2022-11-01 DIAGNOSIS — C539 Malignant neoplasm of cervix uteri, unspecified: Secondary | ICD-10-CM | POA: Insufficient documentation

## 2022-11-01 MED ORDER — IOHEXOL 300 MG/ML  SOLN
100.0000 mL | Freq: Once | INTRAMUSCULAR | Status: AC | PRN
  Administered 2022-11-01: 100 mL via INTRAVENOUS

## 2022-11-03 ENCOUNTER — Telehealth: Payer: Self-pay

## 2022-11-03 NOTE — Telephone Encounter (Signed)
Called her back. Changed lab appt to port lab flush on 5/21 at 8 am. She is aware of appt times on 5/21. Per Dr. Bertis Ruddy, she will review recent CT at [facility] appt on 5/21. Tiffany [name] verbalized understanding and appreciated the call.

## 2022-11-07 ENCOUNTER — Inpatient Hospital Stay: Payer: 59 | Attending: Gynecologic Oncology | Admitting: Hematology and Oncology

## 2022-11-07 ENCOUNTER — Inpatient Hospital Stay: Payer: 59 | Admitting: Licensed Clinical Social Worker

## 2022-11-07 ENCOUNTER — Inpatient Hospital Stay: Payer: 59

## 2022-11-07 ENCOUNTER — Telehealth: Payer: Self-pay

## 2022-11-07 ENCOUNTER — Other Ambulatory Visit: Payer: Self-pay

## 2022-11-07 ENCOUNTER — Encounter: Payer: Self-pay | Admitting: Hematology and Oncology

## 2022-11-07 ENCOUNTER — Other Ambulatory Visit (HOSPITAL_COMMUNITY): Payer: Self-pay

## 2022-11-07 VITALS — BP 157/80 | HR 76 | Temp 99.1°F | Resp 18 | Ht 63.0 in | Wt 138.8 lb

## 2022-11-07 DIAGNOSIS — Q631 Lobulated, fused and horseshoe kidney: Secondary | ICD-10-CM | POA: Diagnosis not present

## 2022-11-07 DIAGNOSIS — G620 Drug-induced polyneuropathy: Secondary | ICD-10-CM

## 2022-11-07 DIAGNOSIS — R718 Other abnormality of red blood cells: Secondary | ICD-10-CM

## 2022-11-07 DIAGNOSIS — Z79899 Other long term (current) drug therapy: Secondary | ICD-10-CM | POA: Insufficient documentation

## 2022-11-07 DIAGNOSIS — Z5112 Encounter for antineoplastic immunotherapy: Secondary | ICD-10-CM | POA: Diagnosis present

## 2022-11-07 DIAGNOSIS — T451X5A Adverse effect of antineoplastic and immunosuppressive drugs, initial encounter: Secondary | ICD-10-CM | POA: Insufficient documentation

## 2022-11-07 DIAGNOSIS — I700 Atherosclerosis of aorta: Secondary | ICD-10-CM | POA: Insufficient documentation

## 2022-11-07 DIAGNOSIS — Z79620 Long term (current) use of immunosuppressive biologic: Secondary | ICD-10-CM | POA: Diagnosis not present

## 2022-11-07 DIAGNOSIS — C539 Malignant neoplasm of cervix uteri, unspecified: Secondary | ICD-10-CM | POA: Diagnosis not present

## 2022-11-07 DIAGNOSIS — C55 Malignant neoplasm of uterus, part unspecified: Secondary | ICD-10-CM | POA: Insufficient documentation

## 2022-11-07 DIAGNOSIS — K760 Fatty (change of) liver, not elsewhere classified: Secondary | ICD-10-CM | POA: Diagnosis not present

## 2022-11-07 DIAGNOSIS — L608 Other nail disorders: Secondary | ICD-10-CM

## 2022-11-07 LAB — CBC WITH DIFFERENTIAL (CANCER CENTER ONLY)
Abs Immature Granulocytes: 0.03 10*3/uL (ref 0.00–0.07)
Basophils Absolute: 0 10*3/uL (ref 0.0–0.1)
Basophils Relative: 0 %
Eosinophils Absolute: 0 10*3/uL (ref 0.0–0.5)
Eosinophils Relative: 1 %
HCT: 34.8 % — ABNORMAL LOW (ref 36.0–46.0)
Hemoglobin: 11.7 g/dL — ABNORMAL LOW (ref 12.0–15.0)
Immature Granulocytes: 1 %
Lymphocytes Relative: 37 %
Lymphs Abs: 2.3 10*3/uL (ref 0.7–4.0)
MCH: 25.9 pg — ABNORMAL LOW (ref 26.0–34.0)
MCHC: 33.6 g/dL (ref 30.0–36.0)
MCV: 77.2 fL — ABNORMAL LOW (ref 80.0–100.0)
Monocytes Absolute: 0.4 10*3/uL (ref 0.1–1.0)
Monocytes Relative: 6 %
Neutro Abs: 3.4 10*3/uL (ref 1.7–7.7)
Neutrophils Relative %: 55 %
Platelet Count: 164 10*3/uL (ref 150–400)
RBC: 4.51 MIL/uL (ref 3.87–5.11)
RDW: 13.1 % (ref 11.5–15.5)
WBC Count: 6 10*3/uL (ref 4.0–10.5)
nRBC: 0 % (ref 0.0–0.2)

## 2022-11-07 LAB — CMP (CANCER CENTER ONLY)
ALT: 31 U/L (ref 0–44)
AST: 23 U/L (ref 15–41)
Albumin: 4.3 g/dL (ref 3.5–5.0)
Alkaline Phosphatase: 59 U/L (ref 38–126)
Anion gap: 8 (ref 5–15)
BUN: 14 mg/dL (ref 6–20)
CO2: 24 mmol/L (ref 22–32)
Calcium: 8.9 mg/dL (ref 8.9–10.3)
Chloride: 107 mmol/L (ref 98–111)
Creatinine: 0.67 mg/dL (ref 0.44–1.00)
GFR, Estimated: >60 mL/min (ref >60)
Glucose, Bld: 119 mg/dL — ABNORMAL HIGH (ref 70–99)
Potassium: 3.9 mmol/L (ref 3.5–5.1)
Sodium: 139 mmol/L (ref 135–145)
Total Bilirubin: 0.3 mg/dL (ref 0.3–1.2)
Total Protein: 6.8 g/dL (ref 6.5–8.1)

## 2022-11-07 LAB — TSH: TSH: 5.668 u[IU]/mL — ABNORMAL HIGH (ref 0.350–4.500)

## 2022-11-07 MED ORDER — HEPARIN SOD (PORK) LOCK FLUSH 100 UNIT/ML IV SOLN: 500.0000 [IU] | Freq: Once | INTRAVENOUS | Status: DC

## 2022-11-07 MED ORDER — SODIUM CHLORIDE 0.9 % IV SOLN
1000.0000 mg | Freq: Once | INTRAVENOUS | Status: AC
  Administered 2022-11-07: 1000 mg via INTRAVENOUS
  Filled 2022-11-07: qty 20

## 2022-11-07 MED ORDER — SODIUM CHLORIDE 0.9% FLUSH
10.0000 mL | Freq: Once | INTRAVENOUS | Status: AC
  Administered 2022-11-07: 10 mL

## 2022-11-07 MED ORDER — SYNTHROID 50 MCG PO TABS
50.0000 ug | ORAL_TABLET | Freq: Every day | ORAL | 1 refills | Status: DC
  Filled 2022-11-07: qty 60, 60d supply, fill #0

## 2022-11-07 MED ORDER — SODIUM CHLORIDE 0.9% FLUSH
10.0000 mL | INTRAVENOUS | Status: DC | PRN
  Administered 2022-11-07: 10 mL

## 2022-11-07 MED ORDER — SODIUM CHLORIDE 0.9 % IV SOLN: Freq: Once | INTRAVENOUS | Status: AC

## 2022-11-07 MED ORDER — HEPARIN SOD (PORK) LOCK FLUSH 100 UNIT/ML IV SOLN
500.0000 [IU] | Freq: Once | INTRAVENOUS | Status: AC | PRN
  Administered 2022-11-07: 500 [IU]

## 2022-11-07 NOTE — Assessment & Plan Note (Signed)
It is consistent with side effects of Taxol I reassured the patient that her new changes will improve in the future

## 2022-11-07 NOTE — Assessment & Plan Note (Signed)
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

## 2022-11-07 NOTE — Assessment & Plan Note (Signed)
We reviewed her CBC I suspect she might have alpha thalassemia

## 2022-11-07 NOTE — Progress Notes (Signed)
[facility] OFFICE PROGRESS NOTE  Patient Care Team: Daisy Floro, MD as PCP - General (Family Medicine)  ASSESSMENT & PLAN:  Uterine cancer [facility]) I have reviewed multiple imaging studies with the patient She has no signs of residual cancer I cannot explain the cause of her neuropathic/sciatica pain on the right side or her rectal discomfort She is scheduled for colonoscopy for evaluation She will continue treatment with immunotherapy every 6 weeks We discussed future follow-up I plan to space out future imaging study to every 6 months, next due in November  Microcytosis We reviewed her CBC I suspect she might have alpha thalassemia  Peripheral neuropathy due to chemotherapy [facility]) It is possible that her sciatica type pain is a form of neuropathy from treatment It will take some time for her body to recover I reassured her that this is not related to cancer recurrence  Change in nail appearance It is consistent with side effects of Taxol I reassured the patient that her new changes will improve in the future  Orders Placed This Encounter  Procedures   CBC with Differential (Cancer Center Only)    Standing Status:   Future    Standing Expiration Date:   12/19/2023   CMP (Cancer Center only)    Standing Status:   Future    Standing Expiration Date:   12/19/2023   T4    Standing Status:   Future    Standing Expiration Date:   12/19/2023   TSH    Standing Status:   Future    Standing Expiration Date:   12/19/2023    All questions were answered. The patient knows to call the clinic with any problems, questions or concerns. The total time spent in the appointment was 30 minutes encounter with patients including review of chart and various tests results, discussions about plan of care and coordination of care plan   Artis Delay, MD 11/07/2022 10:32 AM  INTERVAL HISTORY: Please see below for problem oriented charting. she returns for treatment follow-up and  review of test results We spent majority of our time reviewing imaging studies She complained of intermittent sciatica type pain radiating down to the right leg that comes and goes She is concerned about some nail changes She has intermittent rectal discomfort  REVIEW OF SYSTEMS:   Constitutional: Denies fevers, [name], nose, mouth, throat, [name], [name], chest discomfort or lower extremity swelling Gastrointestinal:  Denies nausea, [name], no new changes  All other systems were reviewed with the patient and are negative.  I have reviewed the past medical history, past surgical history, social history and family history with the patient and they are unchanged from previous note.  ALLERGIES:  is allergic to seasonal ic [octacosanol], chlorhexidine gluconate, and latex.  MEDICATIONS:  Current Outpatient Medications  Medication Sig Dispense Refill   bisacodyl (DULCOLAX) 5 MG EC tablet Take 4 tablets (20 mg total) by mouth as directed by office instructions 4 tablet 0   Cholecalciferol (VITAMIN D3 PO) Take 1 tablet by mouth daily.     Multiple Vitamin (MULTIVITAMIN WITH MINERALS) TABS tablet Take 1 tablet by mouth daily.     polyethylene glycol-electrolytes (NULYTELY) 420 g solution Take as directed by office instructions 4000 mL  0   SYNTHROID 25 MCG tablet Take 1 tablet (25 mcg total) by mouth daily before breakfast. 30 tablet 1   No current facility-administered medications for this visit.   Facility-Administered Medications Ordered in Other Visits  Medication Dose Route Frequency Provider Last Rate Last Admin   sodium chloride flush (NS) 0.9 % injection 10 mL  10  mL Intracatheter Once Bertis Ruddy, Monserratt Knezevic, MD       sodium chloride flush (NS) 0.9 % injection 10 mL  10 mL Intracatheter PRN Bertis Ruddy, Cailynn Bodnar, MD   10 mL at 11/07/22 1031    SUMMARY OF ONCOLOGIC HISTORY: Oncology History Overview Note  HER2 negative, Neg Genetics MMR Normal P53 positive   Uterine cancer (HCC)  12/03/2021 Initial Diagnosis   Patient reports several month history of intermittent pelvic pain that she describes as discomfort or sometimes cramping, that has worsened with time.  She notes that this is tolerable.  She began having postmenopausal bleeding started on 6/17 with a few spots of blood.     12/29/2021 Imaging   1. Bilateral large multiloculated cystic lesions of the ovaries, concerning for ovarian malignancy. Recommend gynecologic consultation and pelvic ultrasound and/or pelvic MRI with contrast for further evaluation. 2. Large heterogeneous mass of the posterior pelvis likely arising from the lower uterus, possibly a fibroid, although malignancy is also a concern. Recommend attention on pelvic ultrasound or MRI as above. 3. Additional mass arising from the right uterine fundus which correlates with fibroid described on prior 2015 ultrasound. 4. Nonspecific small ground-glass nodule of the right lower lobe measuring 6 mm. Recommend follow-up chest CT in 6 months for further evaluation. 5. Aortic Atherosclerosis (ICD10-I70.0).   12/30/2021 Pathology Results   A. CERVIX, BIOPSY:  -  Scantly cellular specimen with predominantly blood, fibrin and acute inflammatory infiltrate but with scattered highly atypical cells highly suspicious for malignancy.   Note: A p16 shows strong positivity in these scattered cells; however, while p53 is mildly increased definitive strong overexpression or under expression is not identified.  These findings support the suspicion for malignancy; however, are insufficient for a definitive diagnosis.     01/03/2022 Imaging   MRI pelvis  1. Large mixed solid  and cystic lesions of the bilateral ovaries, measuring 8.4 x 7.0 x 5.4 cm on the right and 10.3 x 8.7 x 8.1 cm on the left, highly concerning for primary ovarian malignancy and contralateral ovarian metastasis.   2. Homogeneously enhancing mass arising from the right aspect of the uterine body and fundus measuring 6.0 x 5.5 x 4.3 cm, consistent with a uterine fibroid and similar to prior ultrasound dated 2015.   3. An additional, much more heterogeneous mass which appears to arise from the cervix or posterior lower uterine segment measuring 8.1 x 6.4 x 6.2 cm. This may reflect an additional fibroid with internal degeneration, however this was not clearly visualized on ultrasound dated 2015 and is highly suspicious for a cervical mass as significant enlargement of benign fibroids is not expected following menopause. This mass appears to closely abut and compress or perhaps even directly involve the adjacent rectum.   4.  No evidence of lymphadenopathy in the pelvis.   01/12/2022 PET scan   1. Hypermetabolic heterogeneous mass centered on the cervix/lower uterine segment is most consistent with primary gynecologic neoplasm. 2. Multiloculated complex bilateral adnexal cystic lesions with hypermetabolic soft tissue component likely reflect metastatic lesions. 3. No hypermetabolic abdominopelvic adenopathy. 4. No evidence of hypermetabolic metastatic disease in the chest or abdomen.  01/18/2022 Pathology Results   FINAL MICROSCOPIC DIAGNOSIS:   A. CERVICAL MASS, BIOPSIES:  - Poorly differentiated carcinoma with necrosis, consistent with high-grade serous carcinoma, see comment   B. CERVICAL MASS, EXCISION:  - Poorly differentiated carcinoma with necrosis, consistent with high-grade serous carcinoma see comment  - Edges of resected tissue fragments are positive for carcinoma   COMMENT:   A and B.   The tumor cells are positive for PAX8, ER and p16 immunostains.  Immunostain for p53 shows  significant overexpression. Immunostain for CK5/6 shows weak focal labeling while immunostain for  p63 is negative.  The immunoprofile is consistent with above interpretation.  The carcinoma is likely endometrial or ovarian origin with secondary involvement of the cervix.     01/23/2022 Initial Diagnosis   Uterine cancer (HCC)   01/23/2022 Cancer Staging   Staging form: Corpus Uteri - Carcinoma and Carcinosarcoma, AJCC 8th Edition - Clinical stage from 01/23/2022: FIGO Stage IIIA (cT3a, cN0, cM0) - Signed by Artis Delay, MD on 01/23/2022 Stage prefix: Initial diagnosis   01/31/2022 - 01/31/2022 Chemotherapy   Patient is on Treatment Plan : UTERINE ENDOMETRIAL Dostarlimab-gxly (500 mg) + Carboplatin (AUC 5) + Paclitaxel (175 mg/m2) q21d x 6 cycles / Dostarlimab-gxly (1000 mg) q42d x 6 cycles      01/31/2022 -  Chemotherapy   Patient is on Treatment Plan : UTERINE ENDOMETRIAL Dostarlimab-gxly (500 mg) + Carboplatin (AUC 5) + Paclitaxel (175 mg/m2) q21d x 6 cycles / Dostarlimab-gxly (1000 mg) q42d x 6 cycles      04/14/2022 PET scan   1. Significant interval positive response to therapy. Mildly hypermetabolic solid 5.5 x 2.8 cm posterior uterine cervix mass and mildly hypermetabolic bilateral adnexal mixed cystic and solid masses are all significantly decreased in size and metabolism. 2. No new or progressive hypermetabolic metastatic disease. 3. Low level FDG uptake associated with healing subacute sclerotic nondisplaced anterior left third, fourth and fifth rib fractures without associated discrete osseous lesions, correlate for history of interval injury in this location. 4. Horseshoe kidney.   05/03/2022 Surgery   Robotic-assisted laparoscopic total hysterectomy with bilateral salpingo-oophorectomy, tumor debulking including infracolic omentectomy, mini-lap, cystoscopy, and vaginal laceration repair   Findings:  On speculum exam, normal-appearing cervix that is flush with the vaginal apex.  No  visible cervical lesion.  On EUA, moderately mobile, enlarged uterus with mobile fullness appreciated in the cul-de-sac.  On intra-abdominal entry, normal upper abdominal survey.  Some adhesions of the omentum to the anterior abdominal wall below the umbilicus were noted.  Normal-appearing omentum, small and large bowel.  No ascites.  Right ovary minimally enlarged but overall normal in appearance.  Left ovary replaced by a 6 cm cystic mass, smooth.  Normal-appearing bilateral fallopian tubes.  Bladder was somewhat adherent anteriorly to the cervix.  Uterus approximately 8 cm and deviated to the left given a 10 cm anterior and right-sided fibroid.  The cervix itself did not appear enlarged, either on intra-abdominal phonation or speculum exam.  Posteriorly, a white ridge of somewhat indurated tissue was noted underneath the cervix and just above the rectum.  On palpation, this ridge could be appreciated only on rectovaginal exam.  It was separate and free from the cervix itself.  It was difficult to distinguish whether this was treated tumor or active tumor although its detached location from the cervix and uterus was noted.  Given my conversation with the patient prior to surgery and that she would not want an ostomy (even temporarily), there was no  way to resect this area out without significant risk of damage to the rectum.  Given its location, even in the setting of a bowel prep (which she had not had), she would likely require at least a protective diverting ostomy. Cystoscopy, bladder dome noted to be intact and good efflux seen from bilateral ureteral orifices.   05/03/2022 Pathology Results   A. UTERUS, CERVIX, BILATERAL TUBES AND OVARIES, RESECTION:  - Cervix      Nabothian cysts      Negative for malignancy  - Endometrium      Inactive endometrium      Two benign endometrial polyps      Negative for malignancy  - Myometrium      Leiomyoma      Negative for malignancy  - Right ovary       Hemorrhagic cyst with fibrosis, hemosiderin and focal calcification      Negative for malignancy  - Left ovary      Hemorrhagic cyst with fibrosis, hemosiderin and focal calcification      Negative for malignancy  - Bilateral fallopian tubes      Unremarkable      Negative for malignancy  - See oncology table   B. OMENTUM, EXCISION:  - Benign omental adipose tissue.  - Negative for malignancy.     Genetic Testing   Negative genetic testing. No pathogenic variants identified on the [facility] CancerNext-Expanded+RNA panel. VUS in SDHA called [id number] identified. The report date is 05/03/2022.  The CancerNext-Expanded + RNAinsight gene panel offered by [employer] and includes sequencing and rearrangement analysis for the following 77 genes: IP, ALK, APC*, ATM*, AXIN2, BAP1, BARD1, BLM, BMPR1A, BRCA1*, BRCA2*, BRIP1*, CDC73, CDH1*,CDK4, CDKN1B, CDKN2A, CHEK2*, CTNNA1, DICER1, FANCC, FH, FLCN, GALNT12, KIF1B, LZTR1, MAX, MEN1, MET, MLH1*, MSH2*, MSH3, MSH6*, MUTYH*, NBN, NF1*, NF2, NTHL1, PALB2*, PHOX2B, PMS2*, POT1, PRKAR1A, PTCH1, PTEN*, RAD51C*, RAD51D*,RB1, RECQL, RET, SDHA, SDHAF2, SDHB, SDHC, SDHD, SMAD4, SMARCA4, SMARCB1, SMARCE1, STK11, SUFU, TMEM127, TP53*,TSC1, TSC2, VHL and XRCC2 (sequencing and deletion/duplication); EGFR, EGLN1, HOXB13, KIT, MITF, PDGFRA, POLD1 and POLE (sequencing only); EPCAM and GREM1 (deletion/duplication only).   08/14/2022 Imaging   1. Prior hysterectomy without new suspicious enhancing soft tissue nodularity along the vaginal cuff. 2. Decreased size of the low-density focus along the left external iliac vessels, nonspecific. Interval decrease in size somewhat reassuring, and this is favored to reflect a small lymphocele or loculated fluid. However in the context of ongoing chemotherapy the interval decrease in size may reflect treatment response and as such while not favored metastatic disease can not be excluded. Continued attention on follow-up imaging is  suggested. 3. No evidence of new or progressive disease in the abdomen or pelvis. 4. Diffuse hepatic steatosis.   11/02/2022 Imaging   CT ABDOMEN PELVIS W CONTRAST  Result Date: 11/02/2022 CLINICAL DATA:  Pelvic pain. Uterine carcinoma. Evaluate for recurrence. * Tracking Code: BO * EXAM: CT ABDOMEN AND PELVIS WITH CONTRAST TECHNIQUE: Multidetector CT imaging of the abdomen and pelvis was performed using the standard protocol following bolus administration of intravenous contrast. RADIATION DOSE REDUCTION: This exam was performed according to the departmental dose-optimization program which includes automated exposure control, adjustment of the mA and/or kV according to patient size and/or use of iterative reconstruction technique. CONTRAST:  OMNIPAQUE IOHEXOL 300 MG/ML  SOLN COMPARISON:  08/11/2022 FINDINGS: Lower Chest: No acute findings. Hepatobiliary: No hepatic masses identified. Moderate diffuse hepatic steatosis. Gallbladder is unremarkable. No evidence of biliary ductal dilatation. Pancreas:  No mass or inflammatory  changes. Spleen: Within normal limits in size and appearance. 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 No suspicious masses identified. No evidence of ureteral calculi or hydronephrosis. Unremarkable unopacified urinary bladder. Stomach/Bowel: No evidence of obstruction, inflammatory process or abnormal fluid collections. Normal appendix visualized. Vascular/Lymphatic: No pathologically enlarged lymph nodes. No acute vascular findings. Reproductive: Prior hysterectomy noted. No evidence of pelvic mass or free fluid. No evidence of peritoneal or omental soft tissue nodularity. Other:  None. Musculoskeletal:  No suspicious bone lesions identified. IMPRESSION: No acute findings. No evidence of recurrent or metastatic carcinoma within the abdomen or pelvis. Hepatic steatosis. Horseshoe kidney. Electronically Signed   By: Danae Orleans M.D.   On: 11/02/2022 15:24         PHYSICAL EXAMINATION: ECOG PERFORMANCE STATUS: 1 - Symptomatic but completely ambulatory  Vitals:   11/07/22 0824  BP: (!) 157/80  Pulse: 76  Resp: 18  Temp: 99.1 F (37.3 C)  SpO2: 100%   Filed Weights   11/07/22 0824  Weight: 138 lb 12.8 oz (63 kg)    GENERAL:alert, no distress and comfortable Musculoskeletal: The nail changes are consistent with side effects from recent treatment NEURO: alert & oriented x 3 with fluent speech, no focal motor/sensory deficits  LABORATORY DATA:  I have reviewed the data as listed    Component Value Date/Time   NA 139 11/07/2022 0804   K 3.9 11/07/2022 0804   CL 107 11/07/2022 0804   CO2 24 11/07/2022 0804   GLUCOSE 119 (H) 11/07/2022 0804   BUN 14 11/07/2022 0804   CREATININE 0.67 11/07/2022 0804   CALCIUM 8.9 11/07/2022 0804   PROT 6.8 11/07/2022 0804   ALBUMIN 4.3 11/07/2022 0804   AST 23 11/07/2022 0804   ALT 31 11/07/2022 0804   ALKPHOS 59 11/07/2022 0804   BILITOT 0.3 11/07/2022 0804   GFRNONAA >60 11/07/2022 0804    No results found for: "SPEP", "UPEP"  Lab Results  Component Value Date   WBC 6.0 11/07/2022   NEUTROABS 3.4 11/07/2022   HGB 11.7 (L) 11/07/2022   HCT 34.8 (L) 11/07/2022   MCV 77.2 (L) 11/07/2022   PLT 164 11/07/2022      Chemistry      Component Value Date/Time   NA 139 11/07/2022 0804   K 3.9 11/07/2022 0804   CL 107 11/07/2022 0804   CO2 24 11/07/2022 0804   BUN 14 11/07/2022 0804   CREATININE 0.67 11/07/2022 0804      Component Value Date/Time   CALCIUM 8.9 11/07/2022 0804   ALKPHOS 59 11/07/2022 0804   AST 23 11/07/2022 0804   ALT 31 11/07/2022 0804   BILITOT 0.3 11/07/2022 0804       RADIOGRAPHIC STUDIES: I have reviewed multiple imaging studies with the patient I have personally reviewed the radiological images as listed and agreed with the findings in the report. CT ABDOMEN PELVIS W CONTRAST  Result Date: 11/02/2022 CLINICAL DATA:  Pelvic pain. Uterine carcinoma. Evaluate  for recurrence. * Tracking Code: BO * EXAM: CT ABDOMEN AND PELVIS WITH CONTRAST TECHNIQUE: Multidetector CT imaging of the abdomen and pelvis was performed using the standard protocol following bolus administration of intravenous contrast. RADIATION DOSE REDUCTION: This exam was performed according to the departmental dose-optimization program which includes automated exposure control, adjustment of the mA and/or kV according to patient size and/or use of iterative reconstruction technique. CONTRAST:  OMNIPAQUE IOHEXOL 300 MG/ML  SOLN COMPARISON:  08/11/2022 FINDINGS: Lower Chest: No acute findings. Hepatobiliary: No hepatic masses  identified. Moderate diffuse hepatic steatosis. Gallbladder is unremarkable. No evidence of biliary ductal dilatation. Pancreas:  No mass or inflammatory changes. Spleen: Within normal limits in size and appearance. 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 No suspicious masses identified. No evidence of ureteral calculi or hydronephrosis. Unremarkable unopacified urinary bladder. Stomach/Bowel: No evidence of obstruction, inflammatory process or abnormal fluid collections. Normal appendix visualized. Vascular/Lymphatic: No pathologically enlarged lymph nodes. No acute vascular findings. Reproductive: Prior hysterectomy noted. No evidence of pelvic mass or free fluid. No evidence of peritoneal or omental soft tissue nodularity. Other:  None. Musculoskeletal:  No suspicious bone lesions identified. IMPRESSION: No acute findings. No evidence of recurrent or metastatic carcinoma within the abdomen or pelvis. Hepatic steatosis. Horseshoe kidney. Electronically Signed   By: Danae Orleans M.D.   On: 11/02/2022 15:24

## 2022-11-07 NOTE — Telephone Encounter (Signed)
Called and given below message. Rx sent to pharmacy. She verbalized understanding and appreciated the call.

## 2022-11-07 NOTE — Patient Instructions (Signed)
[facility]  Discharge Instructions: Thank you for choosing [facility] to provide your oncology and hematology care.   If you have a lab appointment with the Cancer Center, please go directly to the Cancer Center and check in at the registration area.   Wear comfortable clothing and clothing appropriate for easy access to any Portacath or PICC line.   We strive to give you quality time with your provider. You may need to reschedule your appointment if you arrive late (15 or more minutes).  Arriving late affects you and other patients whose appointments are after yours.  Also, if you miss three or more appointments without notifying the office, you may be dismissed from the clinic at the provider's discretion.      For prescription refill requests, have your pharmacy contact our office and allow 72 hours for refills to be completed.    Today you received the following chemotherapy and/or immunotherapy agents: [email or web address]      To help prevent nausea and vomiting after your treatment, we encourage you to take your nausea medication as directed.  BELOW ARE SYMPTOMS THAT SHOULD BE REPORTED IMMEDIATELY: *FEVER GREATER THAN 100.4 F (38 C) OR HIGHER *CHILLS OR SWEATING *NAUSEA AND VOMITING THAT IS NOT CONTROLLED WITH YOUR NAUSEA MEDICATION *UNUSUAL SHORTNESS OF BREATH *UNUSUAL BRUISING OR BLEEDING *URINARY PROBLEMS (pain or burning when urinating, or frequent urination) *BOWEL PROBLEMS (unusual diarrhea, constipation, pain near the anus) TENDERNESS IN MOUTH AND THROAT WITH OR WITHOUT PRESENCE OF ULCERS (sore throat, sores in mouth, or a toothache) UNUSUAL RASH, SWELLING OR PAIN  UNUSUAL VAGINAL DISCHARGE OR ITCHING   Items with * indicate a potential emergency and should be followed up as soon as possible or go to the Emergency Department if any problems should occur.  Please show the CHEMOTHERAPY ALERT CARD or IMMUNOTHERAPY ALERT CARD at  check-in to the Emergency Department and triage nurse.  Should you have questions after your visit or need to cancel or reschedule your appointment, please contact [facility]  Dept: [phone]  and follow the prompts.  Office hours are 8:00 a.m. to 4:30 p.m. Monday - Friday. Please note that voicemails left after 4:00 p.m. may not be returned until the following business day.  We are closed weekends and major holidays. You have access to a nurse at all times for urgent questions. Please call the main number to the clinic Dept: [phone] and follow the prompts.   For any non-urgent questions, you may also contact your provider using MyChart. We now offer e-Visits for anyone 18 and older to request care online for non-urgent symptoms. For details visit [email or web address].   Also download the MyChart app! Go to the app store, search "MyChart", open the app, select [facility], and log in with your MyChart username and password.   

## 2022-11-07 NOTE — Telephone Encounter (Signed)
-----   Message from Artis Delay, MD sent at 11/07/2022  3:07 PM EDT ----- Her TSH is high Please call in 50 mcg synthroid to her pharmacy, 60 tabs, 1 refills

## 2022-11-07 NOTE — Assessment & Plan Note (Signed)
I have reviewed multiple imaging studies with the patient She has no signs of residual cancer I cannot explain the cause of her neuropathic/sciatica pain on the right side or her rectal discomfort She is scheduled for colonoscopy for evaluation She will continue treatment with immunotherapy every 6 weeks We discussed future follow-up I plan to space out future imaging study to every 6 months, next due in November

## 2022-11-07 NOTE — Progress Notes (Signed)
[facility] CSW Progress Note  Visual merchandiser met with patient in infusion. Patient reports that she has signed up for a new insurance plan through the marketplace. She thinks it will cover her providers here, but will make sure once she receives the card. She has also begun applying for new jobs and has had a few interviews.   Otherwise, she reports to be doing fairly well. She was relieved to have a good result from her CT scan and is trying not to dwell on cancer (will it come back, etc) when it is not answerable/solvable. Pt reports changing her eating and beginning to do more movement as well. She is interested in connecting for more support and agreed to a referral to [facility], Orthoptist.    Ronniesha Seibold E Delisa Finck, LCSW Clinical Social Worker [facility]

## 2022-11-08 ENCOUNTER — Other Ambulatory Visit: Payer: Self-pay

## 2022-11-08 ENCOUNTER — Encounter: Payer: Self-pay | Admitting: General Practice

## 2022-11-08 NOTE — Progress Notes (Signed)
[facility] Spiritual Care Note  Referred by Marcelino Duster Zavala/LCSW for additional layer of emotional support. Left voicemail of introduction with direct number, encouraging return call.   [address] [place], [place], [facility] Pager [phone] Voicemail [phone]

## 2022-11-09 LAB — T4: T4, Total: 7.1 ug/dL (ref 4.5–12.0)

## 2022-11-21 ENCOUNTER — Telehealth: Payer: Self-pay

## 2022-11-21 NOTE — Telephone Encounter (Signed)
Call placed to [employer] at [phone] extension [phone], regarding claim number [id number] and record request. Spoke with Andris Baumann, Ability Analyst. Notified Tiffany [name] that requested records had been released by [facility] on 10/05/22. Alaina stated that the Attending Physician's Statement was received on 10/25/2022, but that no records had been received. Provided Alaina with telephone number [phone]) and fax number [phone]) for [facility] to inquire about medical records and notify them that the requested records were not received.

## 2022-11-21 NOTE — Telephone Encounter (Signed)
Returned her call. She has new insurance and wants to send information to the office to make sure we are in network for future appts. I will send her a mychart message and then forward info to PA person per her request.

## 2022-11-22 ENCOUNTER — Telehealth: Payer: Self-pay

## 2022-11-22 NOTE — Telephone Encounter (Signed)
Returned Pt's call regarding long term disability and new insurance cards. Pt states [employer] had to leave a voicemail to medical records and has not heard back. After speaking with Andris Baumann of [employer], 3 most recent office visits faxed to [phone] with receipt confirmation. Pt's new insurance cards printed from [employer] and scanned into chart. Alert of new insurance cards sent to Marta Lamas via secure chat who verbalized understanding. Pt appreciative of call.

## 2022-11-23 ENCOUNTER — Encounter: Payer: Self-pay | Admitting: Hematology and Oncology

## 2022-12-08 ENCOUNTER — Encounter: Payer: Self-pay | Admitting: Hematology and Oncology

## 2022-12-14 ENCOUNTER — Encounter: Payer: Self-pay | Admitting: Gynecologic Oncology

## 2022-12-15 ENCOUNTER — Inpatient Hospital Stay: Payer: BLUE CROSS/BLUE SHIELD | Attending: Gynecologic Oncology | Admitting: Gynecologic Oncology

## 2022-12-15 ENCOUNTER — Encounter: Payer: Self-pay | Admitting: Gynecologic Oncology

## 2022-12-15 ENCOUNTER — Other Ambulatory Visit: Payer: Self-pay

## 2022-12-15 VITALS — BP 124/74 | HR 101 | Temp 98.3°F | Ht 63.78 in | Wt 139.6 lb

## 2022-12-15 DIAGNOSIS — Z9221 Personal history of antineoplastic chemotherapy: Secondary | ICD-10-CM | POA: Diagnosis not present

## 2022-12-15 DIAGNOSIS — Z90710 Acquired absence of both cervix and uterus: Secondary | ICD-10-CM | POA: Insufficient documentation

## 2022-12-15 DIAGNOSIS — Z90722 Acquired absence of ovaries, bilateral: Secondary | ICD-10-CM | POA: Diagnosis not present

## 2022-12-15 DIAGNOSIS — C579 Malignant neoplasm of female genital organ, unspecified: Secondary | ICD-10-CM

## 2022-12-15 DIAGNOSIS — Z8542 Personal history of malignant neoplasm of other parts of uterus: Secondary | ICD-10-CM | POA: Insufficient documentation

## 2022-12-15 NOTE — Progress Notes (Signed)
Gynecologic Oncology Return Clinic Visit  12/15/22  Reason for Visit: surveillance  Treatment History: Oncology History Overview Note  HER2 negative, Neg Genetics MMR Normal P53 positive   Uterine cancer (HCC)  12/03/2021 Initial Diagnosis   Patient reports several month history of intermittent pelvic pain that she describes as discomfort or sometimes cramping, that has worsened with time.  She notes that this is tolerable.  She began having postmenopausal bleeding started on 6/17 with a few spots of blood.     12/29/2021 Imaging   1. Bilateral large multiloculated cystic lesions of the ovaries, concerning for ovarian malignancy. Recommend gynecologic consultation and pelvic ultrasound and/or pelvic MRI with contrast for further evaluation. 2. Large heterogeneous mass of the posterior pelvis likely arising from the lower uterus, possibly a fibroid, although malignancy is also a concern. Recommend attention on pelvic ultrasound or MRI as above. 3. Additional mass arising from the right uterine fundus which correlates with fibroid described on prior 2015 ultrasound. 4. Nonspecific small ground-glass nodule of the right lower lobe measuring 6 mm. Recommend follow-up chest CT in 6 months for further evaluation. 5. Aortic Atherosclerosis (ICD10-I70.0).   12/30/2021 Pathology Results   A. CERVIX, BIOPSY:  -  Scantly cellular specimen with predominantly blood, fibrin and acute inflammatory infiltrate but with scattered highly atypical cells highly suspicious for malignancy.   Note: A p16 shows strong positivity in these scattered cells; however, while p53 is mildly increased definitive strong overexpression or under expression is not identified.  These findings support the suspicion for malignancy; however, are insufficient for a definitive diagnosis.     01/03/2022 Imaging   MRI pelvis  1. Large mixed solid and cystic lesions of the bilateral ovaries, measuring 8.4 x 7.0 x 5.4 cm on the right  and 10.3 x 8.7 x 8.1 cm on the left, highly concerning for primary ovarian malignancy and contralateral ovarian metastasis.   2. Homogeneously enhancing mass arising from the right aspect of the uterine body and fundus measuring 6.0 x 5.5 x 4.3 cm, consistent with a uterine fibroid and similar to prior ultrasound dated 2015.   3. An additional, much more heterogeneous mass which appears to arise from the cervix or posterior lower uterine segment measuring 8.1 x 6.4 x 6.2 cm. This may reflect an additional fibroid with internal degeneration, however this was not clearly visualized on ultrasound dated 2015 and is highly suspicious for a cervical mass as significant enlargement of benign fibroids is not expected following menopause. This mass appears to closely abut and compress or perhaps even directly involve the adjacent rectum.   4.  No evidence of lymphadenopathy in the pelvis.   01/12/2022 PET scan   1. Hypermetabolic heterogeneous mass centered on the cervix/lower uterine segment is most consistent with primary gynecologic neoplasm. 2. Multiloculated complex bilateral adnexal cystic lesions with hypermetabolic soft tissue component likely reflect metastatic lesions. 3. No hypermetabolic abdominopelvic adenopathy. 4. No evidence of hypermetabolic metastatic disease in the chest or abdomen.   01/18/2022 Pathology Results   FINAL MICROSCOPIC DIAGNOSIS:   A. CERVICAL MASS, BIOPSIES:  - Poorly differentiated carcinoma with necrosis, consistent with high-grade serous carcinoma, see comment   B. CERVICAL MASS, EXCISION:  - Poorly differentiated carcinoma with necrosis, consistent with high-grade serous carcinoma see comment  - Edges of resected tissue fragments are positive for carcinoma   COMMENT:   A and B.   The tumor cells are positive for PAX8, ER and p16 immunostains.  Immunostain for p53 shows significant overexpression. Immunostain  for CK5/6 shows weak focal labeling while immunostain for   p63 is negative.  The immunoprofile is consistent with above interpretation.  The carcinoma is likely endometrial or ovarian origin with secondary involvement of the cervix.     01/23/2022 Initial Diagnosis   Uterine cancer (HCC)   01/23/2022 Cancer Staging   Staging form: Corpus Uteri - Carcinoma and Carcinosarcoma, AJCC 8th Edition - Clinical stage from 01/23/2022: FIGO Stage IIIA (cT3a, cN0, cM0) - Signed by Artis Delay, MD on 01/23/2022 Stage prefix: Initial diagnosis   01/31/2022 - 01/31/2022 Chemotherapy   Patient is on Treatment Plan : UTERINE ENDOMETRIAL Dostarlimab-gxly (500 mg) + Carboplatin (AUC 5) + Paclitaxel (175 mg/m2) q21d x 6 cycles / Dostarlimab-gxly (1000 mg) q42d x 6 cycles      01/31/2022 -  Chemotherapy   Patient is on Treatment Plan : UTERINE ENDOMETRIAL Dostarlimab-gxly (500 mg) + Carboplatin (AUC 5) + Paclitaxel (175 mg/m2) q21d x 6 cycles / Dostarlimab-gxly (1000 mg) q42d x 6 cycles      04/14/2022 PET scan   1. Significant interval positive response to therapy. Mildly hypermetabolic solid 5.5 x 2.8 cm posterior uterine cervix mass and mildly hypermetabolic bilateral adnexal mixed cystic and solid masses are all significantly decreased in size and metabolism. 2. No new or progressive hypermetabolic metastatic disease. 3. Low level FDG uptake associated with healing subacute sclerotic nondisplaced anterior left third, fourth and fifth rib fractures without associated discrete osseous lesions, correlate for history of interval injury in this location. 4. Horseshoe kidney.   05/03/2022 Surgery   Robotic-assisted laparoscopic total hysterectomy with bilateral salpingo-oophorectomy, tumor debulking including infracolic omentectomy, mini-lap, cystoscopy, and vaginal laceration repair   Findings:  On speculum exam, normal-appearing cervix that is flush with the vaginal apex.  No visible cervical lesion.  On EUA, moderately mobile, enlarged uterus with mobile fullness appreciated in  the cul-de-sac.  On intra-abdominal entry, normal upper abdominal survey.  Some adhesions of the omentum to the anterior abdominal wall below the umbilicus were noted.  Normal-appearing omentum, small and large bowel.  No ascites.  Right ovary minimally enlarged but overall normal in appearance.  Left ovary replaced by a 6 cm cystic mass, smooth.  Normal-appearing bilateral fallopian tubes.  Bladder was somewhat adherent anteriorly to the cervix.  Uterus approximately 8 cm and deviated to the left given a 10 cm anterior and right-sided fibroid.  The cervix itself did not appear enlarged, either on intra-abdominal phonation or speculum exam.  Posteriorly, a white ridge of somewhat indurated tissue was noted underneath the cervix and just above the rectum.  On palpation, this ridge could be appreciated only on rectovaginal exam.  It was separate and free from the cervix itself.  It was difficult to distinguish whether this was treated tumor or active tumor although its detached location from the cervix and uterus was noted.  Given my conversation with the patient prior to surgery and that she would not want an ostomy (even temporarily), there was no way to resect this area out without significant risk of damage to the rectum.  Given its location, even in the setting of a bowel prep (which she had not had), she would likely require at least a protective diverting ostomy. Cystoscopy, bladder dome noted to be intact and good efflux seen from bilateral ureteral orifices.   05/03/2022 Pathology Results   A. UTERUS, CERVIX, BILATERAL TUBES AND OVARIES, RESECTION:  - Cervix      Nabothian cysts      Negative for  malignancy  - Endometrium      Inactive endometrium      Two benign endometrial polyps      Negative for malignancy  - Myometrium      Leiomyoma      Negative for malignancy  - Right ovary      Hemorrhagic cyst with fibrosis, hemosiderin and focal calcification      Negative for malignancy  - Left  ovary      Hemorrhagic cyst with fibrosis, hemosiderin and focal calcification      Negative for malignancy  - Bilateral fallopian tubes      Unremarkable      Negative for malignancy  - See oncology table   B. OMENTUM, EXCISION:  - Benign omental adipose tissue.  - Negative for malignancy.     Genetic Testing   Negative genetic testing. No pathogenic variants identified on the [facility] CancerNext-Expanded+RNA panel. VUS in SDHA called [id number] identified. The report date is 05/03/2022.  The CancerNext-Expanded + RNAinsight gene panel offered by [employer] and includes sequencing and rearrangement analysis for the following 77 genes: IP, ALK, APC*, ATM*, AXIN2, BAP1, BARD1, BLM, BMPR1A, BRCA1*, BRCA2*, BRIP1*, CDC73, CDH1*,CDK4, CDKN1B, CDKN2A, CHEK2*, CTNNA1, DICER1, FANCC, FH, FLCN, GALNT12, KIF1B, LZTR1, MAX, MEN1, MET, MLH1*, MSH2*, MSH3, MSH6*, MUTYH*, NBN, NF1*, NF2, NTHL1, PALB2*, PHOX2B, PMS2*, POT1, PRKAR1A, PTCH1, PTEN*, RAD51C*, RAD51D*,RB1, RECQL, RET, SDHA, SDHAF2, SDHB, SDHC, SDHD, SMAD4, SMARCA4, SMARCB1, SMARCE1, STK11, SUFU, TMEM127, TP53*,TSC1, TSC2, VHL and XRCC2 (sequencing and deletion/duplication); EGFR, EGLN1, HOXB13, KIT, MITF, PDGFRA, POLD1 and POLE (sequencing only); EPCAM and GREM1 (deletion/duplication only).   08/14/2022 Imaging   1. Prior hysterectomy without new suspicious enhancing soft tissue nodularity along the vaginal cuff. 2. Decreased size of the low-density focus along the left external iliac vessels, nonspecific. Interval decrease in size somewhat reassuring, and this is favored to reflect a small lymphocele or loculated fluid. However in the context of ongoing chemotherapy the interval decrease in size may reflect treatment response and as such while not favored metastatic disease can not be excluded. Continued attention on follow-up imaging is suggested. 3. No evidence of new or progressive disease in the abdomen or pelvis. 4. Diffuse hepatic  steatosis.   11/02/2022 Imaging   CT ABDOMEN PELVIS W CONTRAST  Result Date: 11/02/2022 CLINICAL DATA:  Pelvic pain. Uterine carcinoma. Evaluate for recurrence. * Tracking Code: BO * EXAM: CT ABDOMEN AND PELVIS WITH CONTRAST TECHNIQUE: Multidetector CT imaging of the abdomen and pelvis was performed using the standard protocol following bolus administration of intravenous contrast. RADIATION DOSE REDUCTION: This exam was performed according to the departmental dose-optimization program which includes automated exposure control, adjustment of the mA and/or kV according to patient size and/or use of iterative reconstruction technique. CONTRAST:  OMNIPAQUE IOHEXOL 300 MG/ML  SOLN COMPARISON:  08/11/2022 FINDINGS: Lower Chest: No acute findings. Hepatobiliary: No hepatic masses identified. Moderate diffuse hepatic steatosis. Gallbladder is unremarkable. No evidence of biliary ductal dilatation. Pancreas:  No mass or inflammatory changes. Spleen: Within normal limits in size and appearance. 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 No suspicious masses identified. No evidence of ureteral calculi or hydronephrosis. Unremarkable unopacified urinary bladder. Stomach/Bowel: No evidence of obstruction, inflammatory process or abnormal fluid collections. Normal appendix visualized. Vascular/Lymphatic: No pathologically enlarged lymph nodes. No acute vascular findings. Reproductive: Prior hysterectomy noted. No evidence of pelvic mass or free fluid. No evidence of peritoneal or omental soft tissue nodularity. Other:  None. Musculoskeletal:  No suspicious bone lesions identified. IMPRESSION: No acute  findings. No evidence of recurrent or metastatic carcinoma within the abdomen or pelvis. Hepatic steatosis. Horseshoe kidney. Electronically Signed   By: Danae Orleans M.D.   On: 11/02/2022 15:24        Interval History: The patient reports overall doing well.  She denies any abdominal or pelvic pain.  She  denies any vaginal bleeding.  Reports normal bowel bladder function.  Has some lingering fatigue.  Also has noticed some lingering memory issues since treatment.  Had to cancel a trip to [place] that she had for her 61th birthday planned last year, is looking forward to possibly rescheduling this.  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   oncologist-- dr Bertis Ruddy;   dx 06/ 2023   Multinodular thyroid    bilateral nodules ;  hx bx 07-31-2017 benign ;  last ultrasound in epic 01-08-2020   Neuropathy due to chemotherapeutic drug [facility])    Uterine cancer [facility]) 01/2022   gyn oncologist-- dr Rickiya Picariello/  oncologist--- dr Bertis Ruddy;  dx 01-23-2022,  started chemo 01-31-2022    Past Surgical History:  Procedure Laterality Date   CESAREAN SECTION     COLONOSCOPY  2016   dr Fayrene Fearing [facility])   CYSTOSCOPY N/A 05/03/2022   Procedure: CYSTOSCOPY;  Surgeon: Carver Fila, MD;  Location: [facility];  Service: Gynecology;  Laterality: N/A;   DILATION AND EVACUATION  08/17/2000   @[facility]  for missed ab   IR IMAGING GUIDED PORT INSERTION  01/25/2022   LESION REMOVAL N/A 01/18/2022   Procedure: CERVICAL BIOPSIES;  Surgeon: Carver Fila, MD;  Location: [facility];  Service: Gynecology;  Laterality: N/A;   ROBOTIC ASSISTED TOTAL HYSTERECTOMY WITH BILATERAL SALPINGO OOPHERECTOMY  05/03/2022   @[facility]  by dr Pricilla Holm ;   with mini lap, omentectomy, tumor debulking    Family History  Problem Relation Age of Onset   Thyroid disease Mother    Breast cancer Mother        dx late 35s   Cancer Other        unk type, d. 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  Tobacco Use   Smoking status: Never   Smokeless tobacco: Never  Vaping Use   Vaping Use: Never used  Substance and Sexual Activity   Alcohol use: Never   Drug use: Never   Sexual activity: Not Currently  Other Topics Concern   Not on file  Social History Narrative   Not on file   Social Determinants of Health   Financial Resource Strain: Not on file  Food Insecurity: No Food Insecurity (05/03/2022)   Hunger Vital Sign    Worried About Running Out of Food in the Last Year: Never true    Ran Out of Food in  the Last Year: Never true  Transportation Needs: No Transportation Needs (05/03/2022)   PRAPARE - Administrator, Civil Service (Medical): No    Lack of Transportation (Non-Medical): No  Physical Activity: Not on file  Stress: Not on file  Social Connections: Not on file    Current Medications:  Current Outpatient Medications:    bisacodyl (DULCOLAX) 5 MG EC tablet, Take 4 tablets (20 mg total) by mouth as directed by office instructions., Disp: 4 tablet, Rfl: 0   Cholecalciferol (VITAMIN D3 PO), Take 1 tablet by mouth daily., Disp: , Rfl:    Multiple Vitamin (MULTIVITAMIN WITH MINERALS) TABS tablet, Take 1 tablet by mouth daily., Disp: , Rfl:    SYNTHROID 50 MCG tablet, Take 1 tablet (50 mcg) by mouth daily before breakfast., Disp: 60 tablet, Rfl: 1   polyethylene glycol-electrolytes (NULYTELY) 420 g solution, Take as directed by office instructions, Disp: 4000 mL, Rfl: 0 No current facility-administered medications for this visit.  Facility-Administered Medications Ordered in Other Visits:    sodium chloride flush (NS) 0.9 % injection 10 mL, 10 mL, Intracatheter, Once, Gorsuch, Ni, MD  Review of  Systems: + lymph nodes in neck, anxiety, confusion Denies appetite changes, fevers, chills, fatigue, unexplained weight changes. Denies hearing loss, mouth sores, ringing in ears or voice changes. Denies cough or wheezing.  Denies shortness of breath. Denies chest pain or palpitations. Denies leg swelling. Denies abdominal distention, pain, blood in stools, constipation, diarrhea, nausea, vomiting, or early satiety. Denies pain with intercourse, dysuria, frequency, hematuria or incontinence. Denies hot flashes, pelvic pain, vaginal bleeding or vaginal discharge.   Denies joint pain, back pain or muscle pain/cramps. Denies itching, rash, or wounds. Denies dizziness, headaches, numbness or seizures. Denies easy bruising or bleeding. Denies depression, confusion, or decreased concentration.  Physical Exam: BP 124/74 (BP Location: Left Arm, Patient Position: Sitting)   Pulse (!) 101   Temp 98.3 F (36.8 C)   Ht 5' 3.78" (1.62 m)   Wt 139 lb 9.6 oz (63.3 kg)   SpO2 98%   BMI 24.13 kg/m  General: Alert, oriented, no acute distress. HEENT: Normocephalic, atraumatic, sclera anicteric.  1 shotty mandibular lymph node. Chest: Unlabored breathing on room air.  Lungs clear to auscultation bilaterally, no wheezes or rhonchi. Cardiovascular: Regular rate and rhythm, no murmurs or rubs appreciated. Abdomen: soft, nontender.  Normoactive bowel sounds.  No masses or hepatosplenomegaly appreciated.  Well-healed incisions. Extremities: Grossly normal range of motion.  Warm, well perfused.  No edema bilaterally. Lymphatics: No inguinal, clavicular neuropathy. GU: Normal appearing external genitalia without erythema, excoriation, or lesions.  Speculum exam with extra small speculum is somewhat tolerated by the patient, improved from prior exams.  I am able to insert the speculum most of the length of the vagina and open it minimally.  No obvious lesions noted.  On single digit bimanual exam, I am not  quite able to reach the apex but there is no nodularity or masses appreciated.  Rectal exam is poorly tolerated, unable to feel the apex of the rectovaginal septum.  Laboratory & Radiologic Studies: CT A/P on 11/01/22: No acute findings. No evidence of recurrent or metastatic carcinoma within the abdomen or pelvis. Hepatic steatosis. Horseshoe kidney.  Assessment & Plan: Tiffany [name] is a 62 y.o. 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  The patient is doing very well.  She  is NED on exam today.  Recent CT scan was negative for any metastatic disease.  Continues to struggle some with pelvic exams although improved from prior.  Discussed using either Ativan or narcotic prior to future exams to see if this helps her tolerate them better.  She did not have a driver today unfortunately so we are not able to do this.  We reviewed signs and symptoms that would be concerning for disease recurrence.  I stressed the importance of calling if she develops any of these between visits.  I will plan to see her back for follow-up in 4 months.  20 minutes of total time was spent for this patient encounter, including preparation, face-to-face counseling with the patient and coordination of care, and documentation of the encounter.  Eugene Garnet, MD  Division of Gynecologic Oncology  Department of Obstetrics and Gynecology  [facility]

## 2022-12-15 NOTE — Patient Instructions (Signed)
It was good to see you today.  I do not see or feel any evidence of cancer recurrence on your exam.  I will see you for follow-up in 4 months.  As always, if you develop any new and concerning symptoms before your next visit, please call to see me sooner.  

## 2022-12-16 ENCOUNTER — Other Ambulatory Visit: Payer: Self-pay

## 2022-12-19 ENCOUNTER — Inpatient Hospital Stay: Payer: Medicaid Other | Attending: Gynecologic Oncology

## 2022-12-19 ENCOUNTER — Telehealth: Payer: Self-pay

## 2022-12-19 ENCOUNTER — Other Ambulatory Visit (HOSPITAL_COMMUNITY): Payer: Self-pay

## 2022-12-19 ENCOUNTER — Encounter: Payer: Self-pay | Admitting: Hematology and Oncology

## 2022-12-19 ENCOUNTER — Other Ambulatory Visit: Payer: Self-pay

## 2022-12-19 ENCOUNTER — Inpatient Hospital Stay (HOSPITAL_BASED_OUTPATIENT_CLINIC_OR_DEPARTMENT_OTHER): Payer: Medicaid Other | Admitting: Hematology and Oncology

## 2022-12-19 ENCOUNTER — Inpatient Hospital Stay: Payer: Medicaid Other

## 2022-12-19 VITALS — BP 145/92 | HR 75 | Temp 98.7°F | Resp 18

## 2022-12-19 VITALS — BP 154/78 | HR 77 | Temp 98.5°F | Resp 18 | Ht 63.78 in | Wt 139.2 lb

## 2022-12-19 DIAGNOSIS — R718 Other abnormality of red blood cells: Secondary | ICD-10-CM | POA: Diagnosis not present

## 2022-12-19 DIAGNOSIS — C55 Malignant neoplasm of uterus, part unspecified: Secondary | ICD-10-CM | POA: Diagnosis present

## 2022-12-19 DIAGNOSIS — Q631 Lobulated, fused and horseshoe kidney: Secondary | ICD-10-CM | POA: Insufficient documentation

## 2022-12-19 DIAGNOSIS — I700 Atherosclerosis of aorta: Secondary | ICD-10-CM | POA: Insufficient documentation

## 2022-12-19 DIAGNOSIS — C539 Malignant neoplasm of cervix uteri, unspecified: Secondary | ICD-10-CM

## 2022-12-19 DIAGNOSIS — K760 Fatty (change of) liver, not elsewhere classified: Secondary | ICD-10-CM | POA: Insufficient documentation

## 2022-12-19 DIAGNOSIS — Z5112 Encounter for antineoplastic immunotherapy: Secondary | ICD-10-CM | POA: Diagnosis present

## 2022-12-19 DIAGNOSIS — Z79890 Hormone replacement therapy (postmenopausal): Secondary | ICD-10-CM | POA: Diagnosis not present

## 2022-12-19 DIAGNOSIS — R5383 Other fatigue: Secondary | ICD-10-CM | POA: Diagnosis not present

## 2022-12-19 DIAGNOSIS — Z79899 Other long term (current) drug therapy: Secondary | ICD-10-CM | POA: Diagnosis not present

## 2022-12-19 LAB — CMP (CANCER CENTER ONLY)
ALT: 55 U/L — ABNORMAL HIGH (ref 0–44)
AST: 39 U/L (ref 15–41)
Albumin: 4.2 g/dL (ref 3.5–5.0)
Alkaline Phosphatase: 59 U/L (ref 38–126)
Anion gap: 11 (ref 5–15)
BUN: 12 mg/dL (ref 6–20)
CO2: 23 mmol/L (ref 22–32)
Calcium: 8.9 mg/dL (ref 8.9–10.3)
Chloride: 105 mmol/L (ref 98–111)
Creatinine: 0.78 mg/dL (ref 0.44–1.00)
GFR, Estimated: >60 mL/min (ref >60)
Glucose, Bld: 117 mg/dL — ABNORMAL HIGH (ref 70–99)
Potassium: 3.9 mmol/L (ref 3.5–5.1)
Sodium: 139 mmol/L (ref 135–145)
Total Bilirubin: 0.4 mg/dL (ref 0.3–1.2)
Total Protein: 6.6 g/dL (ref 6.5–8.1)

## 2022-12-19 LAB — CBC WITH DIFFERENTIAL (CANCER CENTER ONLY)
Abs Immature Granulocytes: 0.02 10*3/uL (ref 0.00–0.07)
Basophils Absolute: 0.1 10*3/uL (ref 0.0–0.1)
Basophils Relative: 1 %
Eosinophils Absolute: 0.1 10*3/uL (ref 0.0–0.5)
Eosinophils Relative: 1 %
HCT: 37.5 % (ref 36.0–46.0)
Hemoglobin: 12.6 g/dL (ref 12.0–15.0)
Immature Granulocytes: 0 %
Lymphocytes Relative: 45 %
Lymphs Abs: 3.2 10*3/uL (ref 0.7–4.0)
MCH: 25.4 pg — ABNORMAL LOW (ref 26.0–34.0)
MCHC: 33.6 g/dL (ref 30.0–36.0)
MCV: 75.5 fL — ABNORMAL LOW (ref 80.0–100.0)
Monocytes Absolute: 0.5 10*3/uL (ref 0.1–1.0)
Monocytes Relative: 7 %
Neutro Abs: 3.2 10*3/uL (ref 1.7–7.7)
Neutrophils Relative %: 46 %
Platelet Count: 175 10*3/uL (ref 150–400)
RBC: 4.97 MIL/uL (ref 3.87–5.11)
RDW: 13.4 % (ref 11.5–15.5)
WBC Count: 7 10*3/uL (ref 4.0–10.5)
nRBC: 0 % (ref 0.0–0.2)

## 2022-12-19 LAB — TSH: TSH: 5.375 u[IU]/mL — ABNORMAL HIGH (ref 0.350–4.500)

## 2022-12-19 MED ORDER — SYNTHROID 75 MCG PO TABS
75.0000 ug | ORAL_TABLET | Freq: Every day | ORAL | 1 refills | Status: DC
  Filled 2022-12-19: qty 30, 30d supply, fill #0
  Filled 2023-01-12: qty 30, 30d supply, fill #1

## 2022-12-19 MED ORDER — SODIUM CHLORIDE 0.9% FLUSH: 10.0000 mL | INTRAVENOUS | Status: DC | PRN

## 2022-12-19 MED ORDER — SODIUM CHLORIDE 0.9 % IV SOLN
1000.0000 mg | Freq: Once | INTRAVENOUS | Status: AC
  Administered 2022-12-19: 1000 mg via INTRAVENOUS
  Filled 2022-12-19: qty 20

## 2022-12-19 MED ORDER — HEPARIN SOD (PORK) LOCK FLUSH 100 UNIT/ML IV SOLN: 500.0000 [IU] | Freq: Once | INTRAVENOUS | Status: DC | PRN

## 2022-12-19 MED ORDER — SODIUM CHLORIDE 0.9 % IV SOLN: Freq: Once | INTRAVENOUS | Status: AC

## 2022-12-19 NOTE — Assessment & Plan Note (Signed)
Her last imaging in May showed no signs of residual cancer She will continue treatment with immunotherapy every 6 weeks We discussed future follow-up I plan to space out future imaging study to every 6 months, next due in November

## 2022-12-19 NOTE — Telephone Encounter (Signed)
-----   Message from Artis Delay, MD sent at 12/19/2022  2:10 PM EDT ----- Increase her synthroid to 75 mcg, call in 60 tabs to her pharmacy and no refills

## 2022-12-19 NOTE — Assessment & Plan Note (Signed)
We reviewed her CBC I suspect she might have alpha thalassemia 

## 2022-12-19 NOTE — Telephone Encounter (Signed)
Called and given below message. She verbalized understanding. Rx sent to her preferred pharmacy. 

## 2022-12-19 NOTE — Telephone Encounter (Signed)
Returned her call and left a message for her to call the office back. She left a message earlier concerned about ALT 55 today. Per Dr. Bertis Ruddy, her CT in May showed hepatic steatosis, that means a fatty liver. Dr. Bertis Ruddy recommends that she lose weight.

## 2022-12-19 NOTE — Progress Notes (Signed)
[facility] OFFICE PROGRESS NOTE  Patient Care Team: Daisy Floro, MD as PCP - General (Family Medicine)  ASSESSMENT & PLAN:  Uterine cancer [facility]) Her last imaging in May showed no signs of residual cancer She will continue treatment with immunotherapy every 6 weeks We discussed future follow-up I plan to space out future imaging study to every 6 months, next due in November  Microcytosis We reviewed her CBC I suspect she might have alpha thalassemia  Orders Placed This Encounter  Procedures   IR REMOVAL TUN ACCESS W/ PORT W/O FL MOD SED    Standing Status:   Future    Standing Expiration Date:   12/19/2023    Order Specific Question:   Reason for exam:    Answer:   no need port    Order Specific Question:   Is the patient pregnant?    Answer:   No    Order Specific Question:   Preferred Imaging Location?    Answer:   [facility]   CBC with Differential (Cancer Center Only)    Standing Status:   Future    Standing Expiration Date:   01/30/2024   CMP (Cancer Center only)    Standing Status:   Future    Standing Expiration Date:   01/30/2024   T4    Standing Status:   Future    Standing Expiration Date:   01/30/2024   TSH    Standing Status:   Future    Standing Expiration Date:   01/30/2024    All questions were answered. The patient knows to call the clinic with any problems, questions or concerns. The total time spent in the appointment was 20 minutes encounter with patients including review of chart and various tests results, discussions about plan of care and coordination of care plan   Artis Delay, MD 12/19/2022 10:46 AM  INTERVAL HISTORY: Please see below for problem oriented charting. she returns for treatment follow-up She continues to have questions related to her original diagnosis She tolerated recent treatment well except for fatigue  REVIEW OF SYSTEMS:   Constitutional: Denies fevers, chills or abnormal weight loss Eyes: Denies  blurriness of vision Ears, nose, mouth, throat, [name], [name], chest discomfort or lower extremity swelling Gastrointestinal:  Denies nausea, heartburn or change in bowel habits Skin: Denies abnormal skin rashes Lymphatics: Denies new lymphadenopathy or easy bruising Neurological:Denies numbness, tingling or new weaknesses Behavioral/Psych: Mood is stable, no new changes  All other systems were reviewed with the patient and are negative.  I have reviewed the past medical history, past surgical history, social history and family history with the patient and they are unchanged from previous note.  ALLERGIES:  is allergic to seasonal ic [octacosanol], chlorhexidine gluconate, and latex.  MEDICATIONS:  Current Outpatient Medications  Medication Sig Dispense Refill   Cholecalciferol (VITAMIN D3 PO) Take 1 tablet by mouth daily.     Multiple Vitamin (MULTIVITAMIN WITH MINERALS) TABS tablet Take 1 tablet by mouth daily.     SYNTHROID 50 MCG tablet Take 1 tablet (50 mcg) by mouth daily before breakfast. 60 tablet 1   No current facility-administered medications for this visit.   Facility-Administered Medications Ordered in Other Visits  Medication Dose Route Frequency Provider Last Rate Last Admin   heparin lock flush 100 unit/mL  500 Units Intracatheter Once PRN Artis Delay, MD       sodium  chloride flush (NS) 0.9 % injection 10 mL  10 mL Intracatheter PRN Bertis Ruddy, Wane Mollett, MD        SUMMARY OF ONCOLOGIC HISTORY: Oncology History Overview Note  HER2 negative, Neg Genetics MMR Normal P53 positive   Uterine cancer (HCC)  12/03/2021 Initial Diagnosis   Patient reports several month history of intermittent pelvic pain that she describes as discomfort or sometimes cramping, that has worsened with time.  She notes that this is tolerable.  She began having postmenopausal bleeding started on  6/17 with a few spots of blood.     12/29/2021 Imaging   1. Bilateral large multiloculated cystic lesions of the ovaries, concerning for ovarian malignancy. Recommend gynecologic consultation and pelvic ultrasound and/or pelvic MRI with contrast for further evaluation. 2. Large heterogeneous mass of the posterior pelvis likely arising from the lower uterus, possibly a fibroid, although malignancy is also a concern. Recommend attention on pelvic ultrasound or MRI as above. 3. Additional mass arising from the right uterine fundus which correlates with fibroid described on prior 2015 ultrasound. 4. Nonspecific small ground-glass nodule of the right lower lobe measuring 6 mm. Recommend follow-up chest CT in 6 months for further evaluation. 5. Aortic Atherosclerosis (ICD10-I70.0).   12/30/2021 Pathology Results   A. CERVIX, BIOPSY:  -  Scantly cellular specimen with predominantly blood, fibrin and acute inflammatory infiltrate but with scattered highly atypical cells highly suspicious for malignancy.   Note: A p16 shows strong positivity in these scattered cells; however, while p53 is mildly increased definitive strong overexpression or under expression is not identified.  These findings support the suspicion for malignancy; however, are insufficient for a definitive diagnosis.     01/03/2022 Imaging   MRI pelvis  1. Large mixed solid and cystic lesions of the bilateral ovaries, measuring 8.4 x 7.0 x 5.4 cm on the right and 10.3 x 8.7 x 8.1 cm on the left, highly concerning for primary ovarian malignancy and contralateral ovarian metastasis.   2. Homogeneously enhancing mass arising from the right aspect of the uterine body and fundus measuring 6.0 x 5.5 x 4.3 cm, consistent with a uterine fibroid and similar to prior ultrasound dated 2015.   3. An additional, much more heterogeneous mass which appears to arise from the cervix or posterior lower uterine segment measuring 8.1 x 6.4 x 6.2 cm. This may  reflect an additional fibroid with internal degeneration, however this was not clearly visualized on ultrasound dated 2015 and is highly suspicious for a cervical mass as significant enlargement of benign fibroids is not expected following menopause. This mass appears to closely abut and compress or perhaps even directly involve the adjacent rectum.   4.  No evidence of lymphadenopathy in the pelvis.   01/12/2022 PET scan   1. Hypermetabolic heterogeneous mass centered on the cervix/lower uterine segment is most consistent with primary gynecologic neoplasm. 2. Multiloculated complex bilateral adnexal cystic lesions with hypermetabolic soft tissue component likely reflect metastatic lesions. 3. No hypermetabolic abdominopelvic adenopathy. 4. No evidence of hypermetabolic metastatic disease in the chest or abdomen.   01/18/2022 Pathology Results   FINAL MICROSCOPIC DIAGNOSIS:   A. CERVICAL MASS, BIOPSIES:  - Poorly differentiated carcinoma with necrosis, consistent with high-grade serous carcinoma, see comment   B. CERVICAL MASS, EXCISION:  - Poorly differentiated carcinoma with necrosis, consistent with high-grade serous carcinoma see comment  - Edges of resected tissue fragments are positive for carcinoma   COMMENT:   A and B.   The tumor cells are positive for  PAX8, ER and p16 immunostains.  Immunostain for p53 shows significant overexpression. Immunostain for CK5/6 shows weak focal labeling while immunostain for  p63 is negative.  The immunoprofile is consistent with above interpretation.  The carcinoma is likely endometrial or ovarian origin with secondary involvement of the cervix.     01/23/2022 Initial Diagnosis   Uterine cancer (HCC)   01/23/2022 Cancer Staging   Staging form: Corpus Uteri - Carcinoma and Carcinosarcoma, AJCC 8th Edition - Clinical stage from 01/23/2022: FIGO Stage IIIA (cT3a, cN0, cM0) - Signed by Artis Delay, MD on 01/23/2022 Stage prefix: Initial diagnosis   01/31/2022  - 01/31/2022 Chemotherapy   Patient is on Treatment Plan : UTERINE ENDOMETRIAL Dostarlimab-gxly (500 mg) + Carboplatin (AUC 5) + Paclitaxel (175 mg/m2) q21d x 6 cycles / Dostarlimab-gxly (1000 mg) q42d x 6 cycles      01/31/2022 -  Chemotherapy   Patient is on Treatment Plan : UTERINE ENDOMETRIAL Dostarlimab-gxly (500 mg) + Carboplatin (AUC 5) + Paclitaxel (175 mg/m2) q21d x 6 cycles / Dostarlimab-gxly (1000 mg) q42d x 6 cycles      04/14/2022 PET scan   1. Significant interval positive response to therapy. Mildly hypermetabolic solid 5.5 x 2.8 cm posterior uterine cervix mass and mildly hypermetabolic bilateral adnexal mixed cystic and solid masses are all significantly decreased in size and metabolism. 2. No new or progressive hypermetabolic metastatic disease. 3. Low level FDG uptake associated with healing subacute sclerotic nondisplaced anterior left third, fourth and fifth rib fractures without associated discrete osseous lesions, correlate for history of interval injury in this location. 4. Horseshoe kidney.   05/03/2022 Surgery   Robotic-assisted laparoscopic total hysterectomy with bilateral salpingo-oophorectomy, tumor debulking including infracolic omentectomy, mini-lap, cystoscopy, and vaginal laceration repair   Findings:  On speculum exam, normal-appearing cervix that is flush with the vaginal apex.  No visible cervical lesion.  On EUA, moderately mobile, enlarged uterus with mobile fullness appreciated in the cul-de-sac.  On intra-abdominal entry, normal upper abdominal survey.  Some adhesions of the omentum to the anterior abdominal wall below the umbilicus were noted.  Normal-appearing omentum, small and large bowel.  No ascites.  Right ovary minimally enlarged but overall normal in appearance.  Left ovary replaced by a 6 cm cystic mass, smooth.  Normal-appearing bilateral fallopian tubes.  Bladder was somewhat adherent anteriorly to the cervix.  Uterus approximately 8 cm and deviated  to the left given a 10 cm anterior and right-sided fibroid.  The cervix itself did not appear enlarged, either on intra-abdominal phonation or speculum exam.  Posteriorly, a white ridge of somewhat indurated tissue was noted underneath the cervix and just above the rectum.  On palpation, this ridge could be appreciated only on rectovaginal exam.  It was separate and free from the cervix itself.  It was difficult to distinguish whether this was treated tumor or active tumor although its detached location from the cervix and uterus was noted.  Given my conversation with the patient prior to surgery and that she would not want an ostomy (even temporarily), there was no way to resect this area out without significant risk of damage to the rectum.  Given its location, even in the setting of a bowel prep (which she had not had), she would likely require at least a protective diverting ostomy. Cystoscopy, bladder dome noted to be intact and good efflux seen from bilateral ureteral orifices.   05/03/2022 Pathology Results   A. UTERUS, CERVIX, BILATERAL TUBES AND OVARIES, RESECTION:  - Cervix  Nabothian cysts      Negative for malignancy  - Endometrium      Inactive endometrium      Two benign endometrial polyps      Negative for malignancy  - Myometrium      Leiomyoma      Negative for malignancy  - Right ovary      Hemorrhagic cyst with fibrosis, hemosiderin and focal calcification      Negative for malignancy  - Left ovary      Hemorrhagic cyst with fibrosis, hemosiderin and focal calcification      Negative for malignancy  - Bilateral fallopian tubes      Unremarkable      Negative for malignancy  - See oncology table   B. OMENTUM, EXCISION:  - Benign omental adipose tissue.  - Negative for malignancy.     Genetic Testing   Negative genetic testing. No pathogenic variants identified on the [facility] CancerNext-Expanded+RNA panel. VUS in SDHA called [id number] identified. The report date is  05/03/2022.  The CancerNext-Expanded + RNAinsight gene panel offered by [employer] and includes sequencing and rearrangement analysis for the following 77 genes: IP, ALK, APC*, ATM*, AXIN2, BAP1, BARD1, BLM, BMPR1A, BRCA1*, BRCA2*, BRIP1*, CDC73, CDH1*,CDK4, CDKN1B, CDKN2A, CHEK2*, CTNNA1, DICER1, FANCC, FH, FLCN, GALNT12, KIF1B, LZTR1, MAX, MEN1, MET, MLH1*, MSH2*, MSH3, MSH6*, MUTYH*, NBN, NF1*, NF2, NTHL1, PALB2*, PHOX2B, PMS2*, POT1, PRKAR1A, PTCH1, PTEN*, RAD51C*, RAD51D*,RB1, RECQL, RET, SDHA, SDHAF2, SDHB, SDHC, SDHD, SMAD4, SMARCA4, SMARCB1, SMARCE1, STK11, SUFU, TMEM127, TP53*,TSC1, TSC2, VHL and XRCC2 (sequencing and deletion/duplication); EGFR, EGLN1, HOXB13, KIT, MITF, PDGFRA, POLD1 and POLE (sequencing only); EPCAM and GREM1 (deletion/duplication only).   08/14/2022 Imaging   1. Prior hysterectomy without new suspicious enhancing soft tissue nodularity along the vaginal cuff. 2. Decreased size of the low-density focus along the left external iliac vessels, nonspecific. Interval decrease in size somewhat reassuring, and this is favored to reflect a small lymphocele or loculated fluid. However in the context of ongoing chemotherapy the interval decrease in size may reflect treatment response and as such while not favored metastatic disease can not be excluded. Continued attention on follow-up imaging is suggested. 3. No evidence of new or progressive disease in the abdomen or pelvis. 4. Diffuse hepatic steatosis.   11/02/2022 Imaging   CT ABDOMEN PELVIS W CONTRAST  Result Date: 11/02/2022 CLINICAL DATA:  Pelvic pain. Uterine carcinoma. Evaluate for recurrence. * Tracking Code: BO * EXAM: CT ABDOMEN AND PELVIS WITH CONTRAST TECHNIQUE: Multidetector CT imaging of the abdomen and pelvis was performed using the standard protocol following bolus administration of intravenous contrast. RADIATION DOSE REDUCTION: This exam was performed according to the departmental dose-optimization program which  includes automated exposure control, adjustment of the mA and/or kV according to patient size and/or use of iterative reconstruction technique. CONTRAST:  OMNIPAQUE IOHEXOL 300 MG/ML  SOLN COMPARISON:  08/11/2022 FINDINGS: Lower Chest: No acute findings. Hepatobiliary: No hepatic masses identified. Moderate diffuse hepatic steatosis. Gallbladder is unremarkable. No evidence of biliary ductal dilatation. Pancreas:  No mass or inflammatory changes. Spleen: Within normal limits in size and appearance. 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 No suspicious masses identified. No evidence of ureteral calculi or hydronephrosis. Unremarkable unopacified urinary bladder. Stomach/Bowel: No evidence of obstruction, inflammatory process or abnormal fluid collections. Normal appendix visualized. Vascular/Lymphatic: No pathologically enlarged lymph nodes. No acute vascular findings. Reproductive: Prior hysterectomy noted. No evidence of pelvic mass or free fluid. No evidence of peritoneal or omental soft tissue nodularity. Other:  None. Musculoskeletal:  No suspicious bone lesions identified. IMPRESSION: No acute findings. No evidence of recurrent or metastatic carcinoma within the abdomen or pelvis. Hepatic steatosis. Horseshoe kidney. Electronically Signed   By: Danae Orleans M.D.   On: 11/02/2022 15:24        PHYSICAL EXAMINATION: ECOG PERFORMANCE STATUS: 1 - Symptomatic but completely ambulatory  Vitals:   12/19/22 0828  BP: (!) 154/78  Pulse: 77  Resp: 18  Temp: 98.5 F (36.9 C)  SpO2: 100%   Filed Weights   12/19/22 0828  Weight: 139 lb 3.2 oz (63.1 kg)    GENERAL:alert, no distress and comfortable NEURO: alert & oriented x 3 with fluent speech, no focal motor/sensory deficits  LABORATORY DATA:  I have reviewed the data as listed    Component Value Date/Time   NA 139 12/19/2022 0757   K 3.9 12/19/2022 0757   CL 105 12/19/2022 0757   CO2 23 12/19/2022 0757   GLUCOSE 117 (H)  12/19/2022 0757   BUN 12 12/19/2022 0757   CREATININE 0.78 12/19/2022 0757   CALCIUM 8.9 12/19/2022 0757   PROT 6.6 12/19/2022 0757   ALBUMIN 4.2 12/19/2022 0757   AST 39 12/19/2022 0757   ALT 55 (H) 12/19/2022 0757   ALKPHOS 59 12/19/2022 0757   BILITOT 0.4 12/19/2022 0757   GFRNONAA >60 12/19/2022 0757    No results found for: "SPEP", "UPEP"  Lab Results  Component Value Date   WBC 7.0 12/19/2022   NEUTROABS 3.2 12/19/2022   HGB 12.6 12/19/2022   HCT 37.5 12/19/2022   MCV 75.5 (L) 12/19/2022   PLT 175 12/19/2022      Chemistry      Component Value Date/Time   NA 139 12/19/2022 0757   K 3.9 12/19/2022 0757   CL 105 12/19/2022 0757   CO2 23 12/19/2022 0757   BUN 12 12/19/2022 0757   CREATININE 0.78 12/19/2022 0757      Component Value Date/Time   CALCIUM 8.9 12/19/2022 0757   ALKPHOS 59 12/19/2022 0757   AST 39 12/19/2022 0757   ALT 55 (H) 12/19/2022 0757   BILITOT 0.4 12/19/2022 0757

## 2022-12-19 NOTE — Progress Notes (Signed)
Patient chose to use a PIV today and chose the point of insertion for her comfort.

## 2022-12-19 NOTE — Patient Instructions (Signed)
[facility]  Discharge Instructions: Thank you for choosing [facility] to provide your oncology and hematology care.   If you have a lab appointment with the Cancer Center, please go directly to the Cancer Center and check in at the registration area.   Wear comfortable clothing and clothing appropriate for easy access to any Portacath or PICC line.   We strive to give you quality time with your provider. You may need to reschedule your appointment if you arrive late (15 or more minutes).  Arriving late affects you and other patients whose appointments are after yours.  Also, if you miss three or more appointments without notifying the office, you may be dismissed from the clinic at the provider's discretion.      For prescription refill requests, have your pharmacy contact our office and allow 72 hours for refills to be completed.    Today you received the following chemotherapy and/or immunotherapy agents Jemperli To help prevent nausea and vomiting after your treatment, we encourage you to take your nausea medication as directed.  BELOW ARE SYMPTOMS THAT SHOULD BE REPORTED IMMEDIATELY: *FEVER GREATER THAN 100.4 F (38 C) OR HIGHER *CHILLS OR SWEATING *NAUSEA AND VOMITING THAT IS NOT CONTROLLED WITH YOUR NAUSEA MEDICATION *UNUSUAL SHORTNESS OF BREATH *UNUSUAL BRUISING OR BLEEDING *URINARY PROBLEMS (pain or burning when urinating, or frequent urination) *BOWEL PROBLEMS (unusual diarrhea, constipation, pain near the anus) TENDERNESS IN MOUTH AND THROAT WITH OR WITHOUT PRESENCE OF ULCERS (sore throat, sores in mouth, or a toothache) UNUSUAL RASH, SWELLING OR PAIN  UNUSUAL VAGINAL DISCHARGE OR ITCHING   Items with * indicate a potential emergency and should be followed up as soon as possible or go to the Emergency Department if any problems should occur.  Please show the CHEMOTHERAPY ALERT CARD or IMMUNOTHERAPY ALERT CARD at check-in to  the Emergency Department and triage nurse.  Should you have questions after your visit or need to cancel or reschedule your appointment, please contact [facility]  Dept: [phone]  and follow the prompts.  Office hours are 8:00 a.m. to 4:30 p.m. Monday - Friday. Please note that voicemails left after 4:00 p.m. may not be returned until the following business day.  We are closed weekends and major holidays. You have access to a nurse at all times for urgent questions. Please call the main number to the clinic Dept: [phone] and follow the prompts.   For any non-urgent questions, you may also contact your provider using MyChart. We now offer e-Visits for anyone 18 and older to request care online for non-urgent symptoms. For details visit [email or web address].   Also download the MyChart app! Go to the app store, search "MyChart", open the app, select [facility], and log in with your MyChart username and password.  

## 2022-12-19 NOTE — Telephone Encounter (Signed)
Spoke with her and she verbalized understanding to the below message.

## 2022-12-20 ENCOUNTER — Other Ambulatory Visit (HOSPITAL_COMMUNITY): Payer: Self-pay

## 2022-12-20 ENCOUNTER — Encounter: Payer: Self-pay | Admitting: Hematology and Oncology

## 2022-12-20 LAB — T4: T4, Total: 8.5 ug/dL (ref 4.5–12.0)

## 2022-12-22 ENCOUNTER — Ambulatory Visit (HOSPITAL_COMMUNITY)
Admission: RE | Admit: 2022-12-22 | Discharge: 2022-12-22 | Disposition: A | Discharge status: Home / Self Care | Payer: BLUE CROSS/BLUE SHIELD | Source: Ambulatory Visit | Attending: Hematology and Oncology | Admitting: Hematology and Oncology

## 2022-12-22 DIAGNOSIS — C539 Malignant neoplasm of cervix uteri, unspecified: Secondary | ICD-10-CM | POA: Insufficient documentation

## 2022-12-22 DIAGNOSIS — Z452 Encounter for adjustment and management of vascular access device: Secondary | ICD-10-CM | POA: Diagnosis present

## 2022-12-22 HISTORY — PX: IR REMOVAL TUN ACCESS W/ PORT W/O FL MOD SED: IMG2290

## 2022-12-22 MED ORDER — LIDOCAINE-EPINEPHRINE 1 %-1:100000 IJ SOLN
INTRAMUSCULAR | Status: AC
  Filled 2022-12-22: qty 1

## 2022-12-25 ENCOUNTER — Telehealth: Payer: Self-pay

## 2022-12-25 NOTE — Telephone Encounter (Signed)
TC to pt after noting that she called after hours service on 12/23/22 d/t toe injury. Pt states that she stumped her R 2nd toe and placed ice directly on her skin for approx 30 minutes, and now she has a blister on her toe which she suspects is "frost bite." Pt denies numbness or severe pain, stating her toe burns a little and is slightly bruised. She states, "I don't think it's broken. I just want to know what to do for the burning." Advised pt that healing process will take time and that blister should pop/resolve, after which time she can apply Neosporin and a band aid to area. Instructed to monitor for s/s of infection such as severe and/or spreading redness, purulent drainage, and fever, and to seek medical attention if these develop. She verbalizes understanding.

## 2023-01-04 ENCOUNTER — Telehealth: Payer: Self-pay | Admitting: *Deleted

## 2023-01-04 NOTE — Telephone Encounter (Signed)
Fax paperwork to [facility] yesterday

## 2023-01-15 ENCOUNTER — Telehealth: Payer: Self-pay

## 2023-01-15 NOTE — Telephone Encounter (Signed)
She called and left a message. She is having joint pain in the am to her fingers/ toes. She used to take glucosamine and Tumeric but stopped last year.  She is asking what Dr. Bertis Ruddy recommends and does she recommend restarting glucosamine and Tumeric.

## 2023-01-16 ENCOUNTER — Encounter: Payer: Self-pay | Admitting: Hematology and Oncology

## 2023-01-16 NOTE — Telephone Encounter (Signed)
Called and left below message. Ask her to call the office for questions. ?

## 2023-01-16 NOTE — Telephone Encounter (Signed)
She can restart glucosamine

## 2023-01-17 ENCOUNTER — Telehealth: Payer: Self-pay | Admitting: *Deleted

## 2023-01-17 NOTE — Telephone Encounter (Signed)
Tiffany [name] in office reporting recent denial of disability claim no. [id number].  "Nothing has changed.  What was entered on the form.  They HAven'y received anything from Dr. Bertis Ruddy since May." Connected with GYN Staff as most recent request is addressed to and was completed by GYN/ONC.  [name] discussed above with CMA.  This nurse checked [employer].I.M. releases.  No records released.  This nurse obtained signed [place] [employer].  Awaiting e-mails patient received to be forwarded to [email or web address] to further assist.  No record release noted per H.I.M release tab.

## 2023-01-19 ENCOUNTER — Telehealth: Payer: Self-pay

## 2023-01-19 ENCOUNTER — Encounter: Payer: Self-pay | Admitting: Hematology and Oncology

## 2023-01-19 NOTE — Telephone Encounter (Signed)
Notified Patient of completion of information requested to be sent for appeal of Disability denial. Fax transmission confirmation received. Copy of information emailed to Patient as requested. No other needs or concerns voiced at this time.

## 2023-01-22 NOTE — Telephone Encounter (Signed)
[email or web address]  Disability denial letter [name]  [employer]A  "[employer]" [email or web address]>  Hello ,  Please let me know if you can see the below email and let me know how I can appeal.  Thank you!  [name]   PLEASE DO NOT REPLY - This mailbox is not monitored on a daily basis.  If you have any questions regarding this communication, please contact [facility] at [phone] extension [phone].  [facility] 01/16/2023   Dear Tiffany [name],  Tiffany [name] reviewed the Long-Term Disability (LTD) claim ([id number]) you submitted for 01/11/2022.  Based on our review, your claim has been terminated.   Why Was My Claim Terminated? After reviewing your file, we found that you don't meet the policy definition of disability beyond 01/02/2023. Here's how your policy defines disability:  Disability or Disabled means You are prevented from performing one or more of the Essential Duties of: 1) Your Occupation during the Elimination Period; 2) Your Occupation, for the 2 year(s) following the Elimination Period, and as a result Your Current Monthly Earnings are less than 80% of Your Indexed Pre-disability Earnings; and 3) after that, Any Occupation. If at the end of the Elimination Period, You are prevented from performing one or more of the Essential Duties of Your Occupation, but Your Current Monthly Earnings are equal to or greater than 80% of Your Pre-disability Earnings, Your Elimination Period will be extended for a total period of 12 months from the original date of Disability, or until such time as Your Current Monthly Earnings are less than 80% of Your Pre-disability Earnings, whichever occurs first. For the purposes of extending Your Elimination Period, Your Current Monthly Earnings will not include the pay You could have received for another job or a modified job if such job was offered to Tiffany [name] by Architectural technologist, or another employer, and You refused the  offer.   Your Disability must result from: 1) accidental bodily injury; 2) sickness; 3) Mental Illness; 4) Substance Abuse; or 5) pregnancy. Your failure to pass a physical examination required to maintain a license to perform the duties of Your Occupation, alone, does not mean that You are Disabled.   Here are the documents we reviewed:  1. Medical Records received from Dr. Eugene Garnet for office visit date of 12/15/2022 2. Clarifying questions received from Warner Mccreedy, NP on 01/03/2023 3. Office visit notes from Dr. Bertis Ruddy for time period of 08/15/2022-11/07/2022 4. Attending Phyisican's statement from Dr. Bertis Ruddy on 10/24/2022   The above information was reviewed by our clinical staff for a review of your restrictions and limitations. The clinical staff also sent a letter to your providers, Dr. Bertis Ruddy and Dr. Pricilla Holm for clarification.  Per the Medical Case Manager assigned to the claim, "Medical information in the claim does not support the restrictions and limitations which have been asserted. The Attending Provider agreed with the Medical Case Manager that the Employee has full duty, full time work capacity in a letter dated 01/03/2023." Due to this information your claim has been terminated.  Below are guidelines that were relied upon in making this determination.   Not Disabled Own Occupation: On an ongoing basis during the own occupation period, the Analyst should compare the: Policy definition of Disability; Essential Duties of the occupation; and Medical and vocational information  The Analyst should recommend a claim be denied or terminated when the analysis of all the medical and vocational information indicates that the claimant no longer meets the policy definition of Disability.  We have policies and procedures in place to ensure that claims are fully and fairly investigated and accurately decided, and that the decision makers are independent, impartial and are  not influenced by financial self-interest. Our claims staff's compensation has no relation to denial or termination rates. We contract with third-party vendors to engage experts to perform peer reviews of medical evidence and independent medical examinations rather than using our own employees.  What can I do next?                You can request a free copy of your claim file.  You are entitled to receive, upon request and free of charge, reasonable access to, and copies of, all documents, records, and other information relevant to your claim for benefits.  You may send your request to the fax number or the address noted under, "How do I appeal?"                 You can appeal.  Your appeal will be independently decided by an Appeals Specialist who was not involved with the prior decision. The Appeals Specialist will consider all comments, documents, records, and other information that you submit, without regard to whether that information was previously submitted or considered.  How do I appeal?                  You may send [place] written comments, documents, records, and other information relating to your claim with your appeal, but you may appeal our decision even if you do not have new information to send to [place].                 There are three ways you can appeal: fax [place] a letter at [phone], or email it to [email or web address], or mail it to this address:                    [employer]                  [address] [id number]                  [place], [place] [address]  Make sure your request includes: Your name and employee ID number (if you have one) The name of your employer Your disability claim number Any information you would like [place] to review What is my deadline to appeal? You have 180 days from receipt of this letter to ask [place] to review your claim by sending a written request. Be sure to send that on time so you don't lose your right to have [place] review our  decision.  What will happen if I appeal? We'll make a final decision no more than 45 days after we receive your timely appeal, unless we determine that special circumstances require an extension of time to make the decision.  If we need more time, we can extend the decision deadline by an additional 45 days, and we will notify you in writing of the special circumstances and the new deadline.  If we need to extend the decision deadline because you have not submitted information necessary to make a decision, the decision deadline will be tolled from the date on which the extension notification is sent to you until the date we receive your response to the request.  If we decide to reverse the prior decision, we will notify you in writing.  If we plan to uphold the adverse benefit  decision, we'll provide you with any new or additional rationale, or any new or additional evidence considered, relied upon or generated by [place] during the appeal review and give you an opportunity to respond to it before we issue our final decision.  If you don't agree with our decision, you can file a law suit under section 502(a) of a law called ERISA, and the appeal decision will include notification of the date on which your right to bring a civil action expires under the policy.       If you have questions or need help, you can call us at [phone] extension [phone], between 8:00 AM and 8:00 PM ET, Monday through Friday or visit [place] online at  [email or web address]. We're here to help.  Thank you,  Andris Baumann Ability Specialist      Please let us know if we can provide additional assistance.  Thank you, [facility] Fax #: [phone] Visit [place] on the Web: [email or web address]   Your underwriting company is [employer]. The [employer], [employer] (NYSE: H[employer]) operates through [employer], including underwriting  companies [employer] and [employer] and [employer], under the brand name, [employer], and is headquartered at [employer], [place], [place] [address]. For additional details, please read [facility]'s legal notice at [email or web address]. [facility] is Astronomer for certain group benefits business written by [employer] and [employer] (formerly known as [employer]). [facility] also provides administrative and claim services for employer leave of absence programs and self-funded disability benefit plans.    This communication, including attachments, is for the exclusive use of addressee and may contain proprietary, confidential and/or privileged information. If you are not the intended recipient, any use, copying, disclosure, dissemination or distribution is strictly prohibited. If you are not the intended recipient, please notify the sender immediately by return e-mail, delete this communication and destroy all copies. WARNING: This email originated outside of [facility]. Even if this looks like a [employer], it is not. Do not provide your username, password, or any other personal information in response to this or any other email. [facility] will never ask you for your username or password via email. DO NOT CLICK links or attachments unless you are positive the content is safe. If in doubt about the safety of this message, select the [employer] Phishing button, which forwards to IT Security.

## 2023-01-22 NOTE — Telephone Encounter (Signed)
Late entry.  Connected with Form staff regarding email received yesterday 01/17/2023 and Tiffany [name] voicemail today 01/18/2023 requesting return call.  [facility] informed of appeal requested for denied disability patient request to appeal within next 4-5 days.

## 2023-01-30 ENCOUNTER — Other Ambulatory Visit: Payer: Self-pay

## 2023-01-30 ENCOUNTER — Inpatient Hospital Stay (HOSPITAL_BASED_OUTPATIENT_CLINIC_OR_DEPARTMENT_OTHER): Payer: Medicaid Other | Admitting: Hematology and Oncology

## 2023-01-30 ENCOUNTER — Encounter: Payer: Self-pay | Admitting: Hematology and Oncology

## 2023-01-30 ENCOUNTER — Inpatient Hospital Stay: Payer: Medicaid Other

## 2023-01-30 ENCOUNTER — Inpatient Hospital Stay: Payer: Medicaid Other | Attending: Gynecologic Oncology

## 2023-01-30 ENCOUNTER — Telehealth: Payer: Self-pay | Admitting: Hematology and Oncology

## 2023-01-30 VITALS — BP 147/85 | HR 80 | Temp 98.3°F | Resp 18 | Ht 63.78 in | Wt 137.6 lb

## 2023-01-30 VITALS — BP 151/82 | HR 80 | Resp 16

## 2023-01-30 DIAGNOSIS — Z5112 Encounter for antineoplastic immunotherapy: Secondary | ICD-10-CM | POA: Insufficient documentation

## 2023-01-30 DIAGNOSIS — L608 Other nail disorders: Secondary | ICD-10-CM

## 2023-01-30 DIAGNOSIS — Q631 Lobulated, fused and horseshoe kidney: Secondary | ICD-10-CM | POA: Diagnosis not present

## 2023-01-30 DIAGNOSIS — C539 Malignant neoplasm of cervix uteri, unspecified: Secondary | ICD-10-CM | POA: Diagnosis not present

## 2023-01-30 DIAGNOSIS — Z79620 Long term (current) use of immunosuppressive biologic: Secondary | ICD-10-CM | POA: Insufficient documentation

## 2023-01-30 DIAGNOSIS — C55 Malignant neoplasm of uterus, part unspecified: Secondary | ICD-10-CM | POA: Insufficient documentation

## 2023-01-30 DIAGNOSIS — I700 Atherosclerosis of aorta: Secondary | ICD-10-CM | POA: Diagnosis not present

## 2023-01-30 DIAGNOSIS — N83202 Unspecified ovarian cyst, left side: Secondary | ICD-10-CM | POA: Insufficient documentation

## 2023-01-30 DIAGNOSIS — Z79899 Other long term (current) drug therapy: Secondary | ICD-10-CM | POA: Diagnosis not present

## 2023-01-30 DIAGNOSIS — E039 Hypothyroidism, unspecified: Secondary | ICD-10-CM | POA: Diagnosis not present

## 2023-01-30 DIAGNOSIS — K760 Fatty (change of) liver, not elsewhere classified: Secondary | ICD-10-CM | POA: Diagnosis not present

## 2023-01-30 DIAGNOSIS — R208 Other disturbances of skin sensation: Secondary | ICD-10-CM | POA: Diagnosis not present

## 2023-01-30 DIAGNOSIS — N83201 Unspecified ovarian cyst, right side: Secondary | ICD-10-CM | POA: Insufficient documentation

## 2023-01-30 LAB — CBC WITH DIFFERENTIAL (CANCER CENTER ONLY)
Abs Immature Granulocytes: 0.01 10*3/uL (ref 0.00–0.07)
Basophils Absolute: 0 10*3/uL (ref 0.0–0.1)
Basophils Relative: 1 %
Eosinophils Absolute: 0.1 10*3/uL (ref 0.0–0.5)
Eosinophils Relative: 1 %
HCT: 36.4 % (ref 36.0–46.0)
Hemoglobin: 12.4 g/dL (ref 12.0–15.0)
Immature Granulocytes: 0 %
Lymphocytes Relative: 45 %
Lymphs Abs: 3.1 10*3/uL (ref 0.7–4.0)
MCH: 25.4 pg — ABNORMAL LOW (ref 26.0–34.0)
MCHC: 34.1 g/dL (ref 30.0–36.0)
MCV: 74.4 fL — ABNORMAL LOW (ref 80.0–100.0)
Monocytes Absolute: 0.5 10*3/uL (ref 0.1–1.0)
Monocytes Relative: 7 %
Neutro Abs: 3.3 10*3/uL (ref 1.7–7.7)
Neutrophils Relative %: 46 %
Platelet Count: 180 10*3/uL (ref 150–400)
RBC: 4.89 MIL/uL (ref 3.87–5.11)
RDW: 13.6 % (ref 11.5–15.5)
WBC Count: 7 10*3/uL (ref 4.0–10.5)
nRBC: 0 % (ref 0.0–0.2)

## 2023-01-30 LAB — CMP (CANCER CENTER ONLY)
ALT: 52 U/L — ABNORMAL HIGH (ref 0–44)
AST: 34 U/L (ref 15–41)
Albumin: 4.3 g/dL (ref 3.5–5.0)
Alkaline Phosphatase: 63 U/L (ref 38–126)
Anion gap: 9 (ref 5–15)
BUN: 14 mg/dL (ref 6–20)
CO2: 23 mmol/L (ref 22–32)
Calcium: 9.1 mg/dL (ref 8.9–10.3)
Chloride: 107 mmol/L (ref 98–111)
Creatinine: 0.79 mg/dL (ref 0.44–1.00)
GFR, Estimated: >60 mL/min (ref >60)
Glucose, Bld: 122 mg/dL — ABNORMAL HIGH (ref 70–99)
Potassium: 4 mmol/L (ref 3.5–5.1)
Sodium: 139 mmol/L (ref 135–145)
Total Bilirubin: 0.5 mg/dL (ref 0.3–1.2)
Total Protein: 7.1 g/dL (ref 6.5–8.1)

## 2023-01-30 LAB — TSH: TSH: 1.768 u[IU]/mL (ref 0.350–4.500)

## 2023-01-30 MED ORDER — SODIUM CHLORIDE 0.9 % IV SOLN: Freq: Once | INTRAVENOUS | Status: AC

## 2023-01-30 MED ORDER — SODIUM CHLORIDE 0.9 % IV SOLN
1000.0000 mg | Freq: Once | INTRAVENOUS | Status: AC
  Administered 2023-01-30: 1000 mg via INTRAVENOUS
  Filled 2023-01-30: qty 20

## 2023-01-30 NOTE — Telephone Encounter (Signed)
Per Dr.Gorsuch. Called pt with message below. VM was left on pt personal cell. Advised to call office for any other concerns.

## 2023-01-30 NOTE — Assessment & Plan Note (Signed)
Her last imaging in May showed no signs of residual cancer She will continue treatment with immunotherapy every 6 weeks We discussed future follow-up I plan to space out future imaging study to every 6 months, next due in November

## 2023-01-30 NOTE — Assessment & Plan Note (Signed)
We discussed expected side effects of immunotherapy including intermittent changes to her thyroid function I will adjust the dose of her treatment accordingly

## 2023-01-30 NOTE — Telephone Encounter (Signed)
-----   Message from Artis Delay sent at 01/30/2023  1:55 PM EDT ----- Let her know her TSH is normal

## 2023-01-30 NOTE — Patient Instructions (Signed)
[facility]  Discharge Instructions: Thank you for choosing [facility] to provide your oncology and hematology care.   If you have a lab appointment with the Cancer Center, please go directly to the Cancer Center and check in at the registration area.   Wear comfortable clothing and clothing appropriate for easy access to any Portacath or PICC line.   We strive to give you quality time with your provider. You may need to reschedule your appointment if you arrive late (15 or more minutes).  Arriving late affects you and other patients whose appointments are after yours.  Also, if you miss three or more appointments without notifying the office, you may be dismissed from the clinic at the provider's discretion.      For prescription refill requests, have your pharmacy contact our office and allow 72 hours for refills to be completed.    Today you received the following chemotherapy and/or immunotherapy agents Jemperli To help prevent nausea and vomiting after your treatment, we encourage you to take your nausea medication as directed.  BELOW ARE SYMPTOMS THAT SHOULD BE REPORTED IMMEDIATELY: *FEVER GREATER THAN 100.4 F (38 C) OR HIGHER *CHILLS OR SWEATING *NAUSEA AND VOMITING THAT IS NOT CONTROLLED WITH YOUR NAUSEA MEDICATION *UNUSUAL SHORTNESS OF BREATH *UNUSUAL BRUISING OR BLEEDING *URINARY PROBLEMS (pain or burning when urinating, or frequent urination) *BOWEL PROBLEMS (unusual diarrhea, constipation, pain near the anus) TENDERNESS IN MOUTH AND THROAT WITH OR WITHOUT PRESENCE OF ULCERS (sore throat, sores in mouth, or a toothache) UNUSUAL RASH, SWELLING OR PAIN  UNUSUAL VAGINAL DISCHARGE OR ITCHING   Items with * indicate a potential emergency and should be followed up as soon as possible or go to the Emergency Department if any problems should occur.  Please show the CHEMOTHERAPY ALERT CARD or IMMUNOTHERAPY ALERT CARD at check-in to  the Emergency Department and triage nurse.  Should you have questions after your visit or need to cancel or reschedule your appointment, please contact [facility]  Dept: [phone]  and follow the prompts.  Office hours are 8:00 a.m. to 4:30 p.m. Monday - Friday. Please note that voicemails left after 4:00 p.m. may not be returned until the following business day.  We are closed weekends and major holidays. You have access to a nurse at all times for urgent questions. Please call the main number to the clinic Dept: [phone] and follow the prompts.   For any non-urgent questions, you may also contact your provider using MyChart. We now offer e-Visits for anyone 18 and older to request care online for non-urgent symptoms. For details visit [email or web address].   Also download the MyChart app! Go to the app store, search "MyChart", open the app, select [facility], and log in with your MyChart username and password.  

## 2023-01-30 NOTE — Assessment & Plan Note (Signed)
It is consistent with side effects of Taxol I reassured the patient that her new changes will improve in the future

## 2023-01-30 NOTE — Progress Notes (Signed)
[facility] OFFICE PROGRESS NOTE  Patient Care Team: Daisy Floro, MD as PCP - General (Family Medicine)  ASSESSMENT & PLAN:  Uterine cancer [facility]) Her last imaging in May showed no signs of residual cancer She will continue treatment with immunotherapy every 6 weeks We discussed future follow-up I plan to space out future imaging study to every 6 months, next due in November  Hypothyroidism We discussed expected side effects of immunotherapy including intermittent changes to her thyroid function I will adjust the dose of her treatment accordingly  Change in nail appearance It is consistent with side effects of Taxol I reassured the patient that her new changes will improve in the future  Orders Placed This Encounter  Procedures   CBC with Differential (Cancer Center Only)    Standing Status:   Future    Standing Expiration Date:   03/12/2024   CMP (Cancer Center only)    Standing Status:   Future    Standing Expiration Date:   03/12/2024   T4    Standing Status:   Future    Standing Expiration Date:   03/12/2024   TSH    Standing Status:   Future    Standing Expiration Date:   03/12/2024    All questions were answered. The patient knows to call the clinic with any problems, questions or concerns. The total time spent in the appointment was 20 minutes encounter with patients including review of chart and various tests results, discussions about plan of care and coordination of care plan   Artis Delay, MD 01/30/2023 9:33 AM  INTERVAL HISTORY: Please see below for problem oriented charting. she returns for treatment follow-up She is concerned about persistent nail changes She also have occasional burning sensation that comes and goes in her pelvis  REVIEW OF SYSTEMS:   Constitutional: Denies fevers, [name], nose, mouth, throat, and face: Denies mucositis or sore throat Respiratory: Denies  cough, [name], chest discomfort or lower extremity swelling Gastrointestinal:  Denies nausea, heartburn or change in bowel habits Skin: Denies abnormal skin rashes Lymphatics: Denies new lymphadenopathy or easy bruising Neurological:Denies numbness, tingling or new weaknesses Behavioral/Psych: Mood is stable, no new changes  All other systems were reviewed with the patient and are negative.  I have reviewed the past medical history, past surgical history, social history and family history with the patient and they are unchanged from previous note.  ALLERGIES:  is allergic to seasonal ic [octacosanol], chlorhexidine gluconate, and latex.  MEDICATIONS:  Current Outpatient Medications  Medication Sig Dispense Refill   ascorbic acid (VITAMIN C) 250 MG CHEW Chew 500 mg by mouth daily.     calcium carbonate (TUMS - DOSED IN MG ELEMENTAL CALCIUM) 500 MG chewable tablet Chew 1 tablet by mouth daily.     Cholecalciferol (VITAMIN D3 PO) Take 1 tablet by mouth daily.     Multiple Vitamin (MULTIVITAMIN WITH MINERALS) TABS tablet Take 1 tablet by mouth daily.     SYNTHROID 75 MCG tablet Take 1 tablet (75 mcg) by mouth daily before breakfast. 30 tablet 1   No current facility-administered medications for this visit.   Facility-Administered Medications Ordered in Other Visits  Medication Dose Route Frequency Provider Last Rate Last Admin   dostarlimab-gxly (JEMPERLI) 1,000 mg in sodium chloride 0.9 % 100 mL (8.3333 mg/mL) chemo infusion  1,000 mg Intravenous Once Artis Delay, MD  SUMMARY OF ONCOLOGIC HISTORY: Oncology History Overview Note  HER2 negative, Neg Genetics MMR Normal P53 positive   Uterine cancer (HCC)  12/03/2021 Initial Diagnosis   Patient reports several month history of intermittent pelvic pain that she describes as discomfort or sometimes cramping, that has worsened with time.  She notes that this is tolerable.  She began  having postmenopausal bleeding started on 6/17 with a few spots of blood.     12/29/2021 Imaging   1. Bilateral large multiloculated cystic lesions of the ovaries, concerning for ovarian malignancy. Recommend gynecologic consultation and pelvic ultrasound and/or pelvic MRI with contrast for further evaluation. 2. Large heterogeneous mass of the posterior pelvis likely arising from the lower uterus, possibly a fibroid, although malignancy is also a concern. Recommend attention on pelvic ultrasound or MRI as above. 3. Additional mass arising from the right uterine fundus which correlates with fibroid described on prior 2015 ultrasound. 4. Nonspecific small ground-glass nodule of the right lower lobe measuring 6 mm. Recommend follow-up chest CT in 6 months for further evaluation. 5. Aortic Atherosclerosis (ICD10-I70.0).   12/30/2021 Pathology Results   A. CERVIX, BIOPSY:  -  Scantly cellular specimen with predominantly blood, fibrin and acute inflammatory infiltrate but with scattered highly atypical cells highly suspicious for malignancy.   Note: A p16 shows strong positivity in these scattered cells; however, while p53 is mildly increased definitive strong overexpression or under expression is not identified.  These findings support the suspicion for malignancy; however, are insufficient for a definitive diagnosis.     01/03/2022 Imaging   MRI pelvis  1. Large mixed solid and cystic lesions of the bilateral ovaries, measuring 8.4 x 7.0 x 5.4 cm on the right and 10.3 x 8.7 x 8.1 cm on the left, highly concerning for primary ovarian malignancy and contralateral ovarian metastasis.   2. Homogeneously enhancing mass arising from the right aspect of the uterine body and fundus measuring 6.0 x 5.5 x 4.3 cm, consistent with a uterine fibroid and similar to prior ultrasound dated 2015.   3. An additional, much more heterogeneous mass which appears to arise from the cervix or posterior lower uterine segment  measuring 8.1 x 6.4 x 6.2 cm. This may reflect an additional fibroid with internal degeneration, however this was not clearly visualized on ultrasound dated 2015 and is highly suspicious for a cervical mass as significant enlargement of benign fibroids is not expected following menopause. This mass appears to closely abut and compress or perhaps even directly involve the adjacent rectum.   4.  No evidence of lymphadenopathy in the pelvis.   01/12/2022 PET scan   1. Hypermetabolic heterogeneous mass centered on the cervix/lower uterine segment is most consistent with primary gynecologic neoplasm. 2. Multiloculated complex bilateral adnexal cystic lesions with hypermetabolic soft tissue component likely reflect metastatic lesions. 3. No hypermetabolic abdominopelvic adenopathy. 4. No evidence of hypermetabolic metastatic disease in the chest or abdomen.   01/18/2022 Pathology Results   FINAL MICROSCOPIC DIAGNOSIS:   A. CERVICAL MASS, BIOPSIES:  - Poorly differentiated carcinoma with necrosis, consistent with high-grade serous carcinoma, see comment   B. CERVICAL MASS, EXCISION:  - Poorly differentiated carcinoma with necrosis, consistent with high-grade serous carcinoma see comment  - Edges of resected tissue fragments are positive for carcinoma   COMMENT:   A and B.   The tumor cells are positive for PAX8, ER and p16 immunostains.  Immunostain for p53 shows significant overexpression. Immunostain for CK5/6 shows weak focal labeling while immunostain for  p63 is negative.  The immunoprofile is consistent with above interpretation.  The carcinoma is likely endometrial or ovarian origin with secondary involvement of the cervix.     01/23/2022 Initial Diagnosis   Uterine cancer (HCC)   01/23/2022 Cancer Staging   Staging form: Corpus Uteri - Carcinoma and Carcinosarcoma, AJCC 8th Edition - Clinical stage from 01/23/2022: FIGO Stage IIIA (cT3a, cN0, cM0) - Signed by Artis Delay, MD on 01/23/2022 Stage  prefix: Initial diagnosis   01/31/2022 - 01/31/2022 Chemotherapy   Patient is on Treatment Plan : UTERINE ENDOMETRIAL Dostarlimab-gxly (500 mg) + Carboplatin (AUC 5) + Paclitaxel (175 mg/m2) q21d x 6 cycles / Dostarlimab-gxly (1000 mg) q42d x 6 cycles      01/31/2022 -  Chemotherapy   Patient is on Treatment Plan : UTERINE ENDOMETRIAL Dostarlimab-gxly (500 mg) + Carboplatin (AUC 5) + Paclitaxel (175 mg/m2) q21d x 6 cycles / Dostarlimab-gxly (1000 mg) q42d x 6 cycles      04/14/2022 PET scan   1. Significant interval positive response to therapy. Mildly hypermetabolic solid 5.5 x 2.8 cm posterior uterine cervix mass and mildly hypermetabolic bilateral adnexal mixed cystic and solid masses are all significantly decreased in size and metabolism. 2. No new or progressive hypermetabolic metastatic disease. 3. Low level FDG uptake associated with healing subacute sclerotic nondisplaced anterior left third, fourth and fifth rib fractures without associated discrete osseous lesions, correlate for history of interval injury in this location. 4. Horseshoe kidney.   05/03/2022 Surgery   Robotic-assisted laparoscopic total hysterectomy with bilateral salpingo-oophorectomy, tumor debulking including infracolic omentectomy, mini-lap, cystoscopy, and vaginal laceration repair   Findings:  On speculum exam, normal-appearing cervix that is flush with the vaginal apex.  No visible cervical lesion.  On EUA, moderately mobile, enlarged uterus with mobile fullness appreciated in the cul-de-sac.  On intra-abdominal entry, normal upper abdominal survey.  Some adhesions of the omentum to the anterior abdominal wall below the umbilicus were noted.  Normal-appearing omentum, small and large bowel.  No ascites.  Right ovary minimally enlarged but overall normal in appearance.  Left ovary replaced by a 6 cm cystic mass, smooth.  Normal-appearing bilateral fallopian tubes.  Bladder was somewhat adherent anteriorly to the cervix.   Uterus approximately 8 cm and deviated to the left given a 10 cm anterior and right-sided fibroid.  The cervix itself did not appear enlarged, either on intra-abdominal phonation or speculum exam.  Posteriorly, a white ridge of somewhat indurated tissue was noted underneath the cervix and just above the rectum.  On palpation, this ridge could be appreciated only on rectovaginal exam.  It was separate and free from the cervix itself.  It was difficult to distinguish whether this was treated tumor or active tumor although its detached location from the cervix and uterus was noted.  Given my conversation with the patient prior to surgery and that she would not want an ostomy (even temporarily), there was no way to resect this area out without significant risk of damage to the rectum.  Given its location, even in the setting of a bowel prep (which she had not had), she would likely require at least a protective diverting ostomy. Cystoscopy, bladder dome noted to be intact and good efflux seen from bilateral ureteral orifices.   05/03/2022 Pathology Results   A. UTERUS, CERVIX, BILATERAL TUBES AND OVARIES, RESECTION:  - Cervix      Nabothian cysts      Negative for malignancy  - Endometrium      Inactive  endometrium      Two benign endometrial polyps      Negative for malignancy  - Myometrium      Leiomyoma      Negative for malignancy  - Right ovary      Hemorrhagic cyst with fibrosis, hemosiderin and focal calcification      Negative for malignancy  - Left ovary      Hemorrhagic cyst with fibrosis, hemosiderin and focal calcification      Negative for malignancy  - Bilateral fallopian tubes      Unremarkable      Negative for malignancy  - See oncology table   B. OMENTUM, EXCISION:  - Benign omental adipose tissue.  - Negative for malignancy.     Genetic Testing   Negative genetic testing. No pathogenic variants identified on the [facility] CancerNext-Expanded+RNA panel. VUS in SDHA called  [id number] identified. The report date is 05/03/2022.  The CancerNext-Expanded + RNAinsight gene panel offered by [employer] and includes sequencing and rearrangement analysis for the following 77 genes: IP, ALK, APC*, ATM*, AXIN2, BAP1, BARD1, BLM, BMPR1A, BRCA1*, BRCA2*, BRIP1*, CDC73, CDH1*,CDK4, CDKN1B, CDKN2A, CHEK2*, CTNNA1, DICER1, FANCC, FH, FLCN, GALNT12, KIF1B, LZTR1, MAX, MEN1, MET, MLH1*, MSH2*, MSH3, MSH6*, MUTYH*, NBN, NF1*, NF2, NTHL1, PALB2*, PHOX2B, PMS2*, POT1, PRKAR1A, PTCH1, PTEN*, RAD51C*, RAD51D*,RB1, RECQL, RET, SDHA, SDHAF2, SDHB, SDHC, SDHD, SMAD4, SMARCA4, SMARCB1, SMARCE1, STK11, SUFU, TMEM127, TP53*,TSC1, TSC2, VHL and XRCC2 (sequencing and deletion/duplication); EGFR, EGLN1, HOXB13, KIT, MITF, PDGFRA, POLD1 and POLE (sequencing only); EPCAM and GREM1 (deletion/duplication only).   08/14/2022 Imaging   1. Prior hysterectomy without new suspicious enhancing soft tissue nodularity along the vaginal cuff. 2. Decreased size of the low-density focus along the left external iliac vessels, nonspecific. Interval decrease in size somewhat reassuring, and this is favored to reflect a small lymphocele or loculated fluid. However in the context of ongoing chemotherapy the interval decrease in size may reflect treatment response and as such while not favored metastatic disease can not be excluded. Continued attention on follow-up imaging is suggested. 3. No evidence of new or progressive disease in the abdomen or pelvis. 4. Diffuse hepatic steatosis.   11/02/2022 Imaging   CT ABDOMEN PELVIS W CONTRAST  Result Date: 11/02/2022 CLINICAL DATA:  Pelvic pain. Uterine carcinoma. Evaluate for recurrence. * Tracking Code: BO * EXAM: CT ABDOMEN AND PELVIS WITH CONTRAST TECHNIQUE: Multidetector CT imaging of the abdomen and pelvis was performed using the standard protocol following bolus administration of intravenous contrast. RADIATION DOSE REDUCTION: This exam was performed according to the  departmental dose-optimization program which includes automated exposure control, adjustment of the mA and/or kV according to patient size and/or use of iterative reconstruction technique. CONTRAST:  OMNIPAQUE IOHEXOL 300 MG/ML  SOLN COMPARISON:  08/11/2022 FINDINGS: Lower Chest: No acute findings. Hepatobiliary: No hepatic masses identified. Moderate diffuse hepatic steatosis. Gallbladder is unremarkable. No evidence of biliary ductal dilatation. Pancreas:  No mass or inflammatory changes. Spleen: Within normal limits in size and appearance. 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 No suspicious masses identified. No evidence of ureteral calculi or hydronephrosis. Unremarkable unopacified urinary bladder. Stomach/Bowel: No evidence of obstruction, inflammatory process or abnormal fluid collections. Normal appendix visualized. Vascular/Lymphatic: No pathologically enlarged lymph nodes. No acute vascular findings. Reproductive: Prior hysterectomy noted. No evidence of pelvic mass or free fluid. No evidence of peritoneal or omental soft tissue nodularity. Other:  None. Musculoskeletal:  No suspicious bone lesions identified. IMPRESSION: No acute findings. No evidence of recurrent or metastatic carcinoma within the  abdomen or pelvis. Hepatic steatosis. Horseshoe kidney. Electronically Signed   By: Danae Orleans M.D.   On: 11/02/2022 15:24      12/22/2022 Procedure   Successful removal of implanted Port-A-Cath.      PHYSICAL EXAMINATION: ECOG PERFORMANCE STATUS: 0 - Asymptomatic  Vitals:   01/30/23 0807  BP: (!) 147/85  Pulse: 80  Resp: 18  Temp: 98.3 F (36.8 C)  SpO2: 100%   Filed Weights   01/30/23 0807  Weight: 137 lb 9.6 oz (62.4 kg)    GENERAL:alert, no distress and comfortable SKIN: skin color, texture, turgor are normal, no rashes or significant lesions.  Noted nail changes  NEURO: alert & oriented x 3 with fluent speech, no focal motor/sensory deficits  LABORATORY  DATA:  I have reviewed the data as listed    Component Value Date/Time   NA 139 01/30/2023 0746   K 4.0 01/30/2023 0746   CL 107 01/30/2023 0746   CO2 23 01/30/2023 0746   GLUCOSE 122 (H) 01/30/2023 0746   BUN 14 01/30/2023 0746   CREATININE 0.79 01/30/2023 0746   CALCIUM 9.1 01/30/2023 0746   PROT 7.1 01/30/2023 0746   ALBUMIN 4.3 01/30/2023 0746   AST 34 01/30/2023 0746   ALT 52 (H) 01/30/2023 0746   ALKPHOS 63 01/30/2023 0746   BILITOT 0.5 01/30/2023 0746   GFRNONAA >60 01/30/2023 0746    No results found for: "SPEP", "UPEP"  Lab Results  Component Value Date   WBC 7.0 01/30/2023   NEUTROABS 3.3 01/30/2023   HGB 12.4 01/30/2023   HCT 36.4 01/30/2023   MCV 74.4 (L) 01/30/2023   PLT 180 01/30/2023      Chemistry      Component Value Date/Time   NA 139 01/30/2023 0746   K 4.0 01/30/2023 0746   CL 107 01/30/2023 0746   CO2 23 01/30/2023 0746   BUN 14 01/30/2023 0746   CREATININE 0.79 01/30/2023 0746      Component Value Date/Time   CALCIUM 9.1 01/30/2023 0746   ALKPHOS 63 01/30/2023 0746   AST 34 01/30/2023 0746   ALT 52 (H) 01/30/2023 0746   BILITOT 0.5 01/30/2023 0746

## 2023-02-11 ENCOUNTER — Telehealth: Payer: BLUE CROSS/BLUE SHIELD

## 2023-02-12 ENCOUNTER — Telehealth: Payer: Self-pay

## 2023-02-12 NOTE — Telephone Encounter (Signed)
Returned her call. She called the after hours phone # on Saturday for increased pelvic pain. She is having intermittent pain to her pelvic area. She is taking tylenol for the pain and it seems to help. She denies constipation and is able to eat/drink. Her appetite is not as good as previously. The pain reminds her of the pelvic pain she had during chemo. She is complaining of increased fatigue since last treatment. She is worried and asking what Dr. Bertis Ruddy recommends. Told her to go to the ER for worsening symptoms and the office would call her back tomorrow. She verbalized understanding.

## 2023-02-13 ENCOUNTER — Other Ambulatory Visit (HOSPITAL_COMMUNITY): Payer: Self-pay

## 2023-02-13 ENCOUNTER — Other Ambulatory Visit: Payer: Self-pay | Admitting: Hematology and Oncology

## 2023-02-13 ENCOUNTER — Encounter: Payer: Self-pay | Admitting: Hematology and Oncology

## 2023-02-13 DIAGNOSIS — C539 Malignant neoplasm of cervix uteri, unspecified: Secondary | ICD-10-CM

## 2023-02-13 DIAGNOSIS — C55 Malignant neoplasm of uterus, part unspecified: Secondary | ICD-10-CM

## 2023-02-13 MED ORDER — SYNTHROID 75 MCG PO TABS
75.0000 ug | ORAL_TABLET | Freq: Every day | ORAL | 1 refills | Status: DC
  Filled 2023-02-13: qty 30, 30d supply, fill #0
  Filled 2023-03-20: qty 30, 30d supply, fill #1

## 2023-02-13 NOTE — Telephone Encounter (Signed)
Called and given below message. She verbalized understanding. Given radiology scheduling #. She will call to schedule CT for next week.

## 2023-02-13 NOTE — Telephone Encounter (Signed)
The best way to assess is with CT I ordered CT abdomen and pelvis Please give her radiology number to call Remind her we need 3 days for PA, so do not schedule for this week Let me know when it is scheduled and I will see her 2 days after it is done

## 2023-02-14 ENCOUNTER — Encounter: Payer: Self-pay | Admitting: Hematology and Oncology

## 2023-02-14 ENCOUNTER — Encounter: Payer: Self-pay | Admitting: General Practice

## 2023-02-14 ENCOUNTER — Other Ambulatory Visit (HOSPITAL_COMMUNITY): Payer: Self-pay

## 2023-02-14 NOTE — Progress Notes (Signed)
[facility] Spiritual Care Note  Met with Tiffany [name] in Spiritual Care office today to give her space to process several concerns on her mind. She is working through the natural "scanxiety" that is accompanying Tuesday's scheduled CT scan, particularly because she has also been hoping to be able to do a big international trip for her 62st birthday in November. Tiffany [name] is looking for opportunities for meaningful distraction and especially for purpose and contribution, such as volunteering, though decreased energy makes the options feel more limited.  Provided empathic listening, emotional support, normalization of feelings, and affirmation of strengths. Tiffany [name] has some leads for coping and meaning-making resources to explore, and she plans to reach out next week to schedule a follow-up conversation to process the CT results.   [address] [place], [place], [facility] Pager [phone] Voicemail [phone]

## 2023-02-16 ENCOUNTER — Other Ambulatory Visit (HOSPITAL_COMMUNITY): Payer: Self-pay

## 2023-02-20 ENCOUNTER — Ambulatory Visit (HOSPITAL_COMMUNITY)
Admission: RE | Admit: 2023-02-20 | Discharge: 2023-02-20 | Disposition: A | Discharge status: Home / Self Care | Payer: Medicaid Other | Source: Ambulatory Visit | Attending: Hematology and Oncology | Admitting: Hematology and Oncology

## 2023-02-20 ENCOUNTER — Encounter (HOSPITAL_COMMUNITY): Payer: Self-pay | Admitting: Radiology

## 2023-02-20 DIAGNOSIS — C55 Malignant neoplasm of uterus, part unspecified: Secondary | ICD-10-CM | POA: Insufficient documentation

## 2023-02-20 MED ORDER — IOHEXOL 300 MG/ML  SOLN
100.0000 mL | Freq: Once | INTRAMUSCULAR | Status: AC | PRN
  Administered 2023-02-20: 100 mL via INTRAVENOUS

## 2023-02-20 MED ORDER — SODIUM CHLORIDE (PF) 0.9 % IJ SOLN
INTRAMUSCULAR | Status: AC
  Filled 2023-02-20: qty 50

## 2023-02-22 ENCOUNTER — Telehealth: Payer: Self-pay

## 2023-02-22 NOTE — Telephone Encounter (Signed)
Returned her call. She is asking why the CT has not been read. Left a message that it is taking up to 7 days for radiologist to read and she will see the results at the same time as the office. Ask her to call the office with questions.

## 2023-02-26 ENCOUNTER — Telehealth: Payer: Self-pay

## 2023-02-26 NOTE — Telephone Encounter (Signed)
She called back and will do a home covid test. She will keep appt as scheduled. She thinks that it is just a head cold. She will do a home covid test and if positive call the office to change appt to virtual tomorrow.

## 2023-02-26 NOTE — Telephone Encounter (Signed)
Returned her call and told her the office will call the reading room to have CT scan read prior to her appt tomorrow. She left a message that she is having cold symptoms, runny nose and sore throat.  Ask her to call the office back. Offered virtual visit tomorrow.

## 2023-02-27 ENCOUNTER — Inpatient Hospital Stay: Payer: Medicaid Other | Attending: Gynecologic Oncology | Admitting: Hematology and Oncology

## 2023-02-27 ENCOUNTER — Encounter: Payer: Self-pay | Admitting: Hematology and Oncology

## 2023-02-27 VITALS — BP 156/88 | HR 91 | Temp 98.6°F | Resp 18 | Ht 63.78 in | Wt 138.8 lb

## 2023-02-27 DIAGNOSIS — R5381 Other malaise: Secondary | ICD-10-CM | POA: Insufficient documentation

## 2023-02-27 DIAGNOSIS — Z79899 Other long term (current) drug therapy: Secondary | ICD-10-CM | POA: Insufficient documentation

## 2023-02-27 DIAGNOSIS — R102 Pelvic and perineal pain: Secondary | ICD-10-CM | POA: Diagnosis not present

## 2023-02-27 DIAGNOSIS — Z5112 Encounter for antineoplastic immunotherapy: Secondary | ICD-10-CM | POA: Diagnosis present

## 2023-02-27 DIAGNOSIS — N83202 Unspecified ovarian cyst, left side: Secondary | ICD-10-CM | POA: Insufficient documentation

## 2023-02-27 DIAGNOSIS — B351 Tinea unguium: Secondary | ICD-10-CM | POA: Insufficient documentation

## 2023-02-27 DIAGNOSIS — G893 Neoplasm related pain (acute) (chronic): Secondary | ICD-10-CM | POA: Diagnosis not present

## 2023-02-27 DIAGNOSIS — Z79620 Long term (current) use of immunosuppressive biologic: Secondary | ICD-10-CM | POA: Diagnosis not present

## 2023-02-27 DIAGNOSIS — N83201 Unspecified ovarian cyst, right side: Secondary | ICD-10-CM | POA: Diagnosis not present

## 2023-02-27 DIAGNOSIS — C55 Malignant neoplasm of uterus, part unspecified: Secondary | ICD-10-CM | POA: Insufficient documentation

## 2023-02-27 DIAGNOSIS — N950 Postmenopausal bleeding: Secondary | ICD-10-CM | POA: Diagnosis not present

## 2023-02-27 DIAGNOSIS — Z79890 Hormone replacement therapy (postmenopausal): Secondary | ICD-10-CM | POA: Diagnosis not present

## 2023-02-27 DIAGNOSIS — Q631 Lobulated, fused and horseshoe kidney: Secondary | ICD-10-CM | POA: Diagnosis not present

## 2023-02-27 DIAGNOSIS — K760 Fatty (change of) liver, not elsewhere classified: Secondary | ICD-10-CM | POA: Insufficient documentation

## 2023-02-27 DIAGNOSIS — E039 Hypothyroidism, unspecified: Secondary | ICD-10-CM | POA: Insufficient documentation

## 2023-02-27 DIAGNOSIS — C539 Malignant neoplasm of cervix uteri, unspecified: Secondary | ICD-10-CM | POA: Diagnosis not present

## 2023-02-27 DIAGNOSIS — I700 Atherosclerosis of aorta: Secondary | ICD-10-CM | POA: Diagnosis not present

## 2023-02-27 DIAGNOSIS — R0981 Nasal congestion: Secondary | ICD-10-CM | POA: Diagnosis not present

## 2023-02-27 DIAGNOSIS — R5383 Other fatigue: Secondary | ICD-10-CM | POA: Diagnosis not present

## 2023-02-27 DIAGNOSIS — E538 Deficiency of other specified B group vitamins: Secondary | ICD-10-CM

## 2023-02-27 NOTE — Progress Notes (Signed)
[facility] OFFICE PROGRESS NOTE  Patient Care Team: Daisy Floro, MD as PCP - General (Family Medicine)  ASSESSMENT & PLAN:  Uterine cancer [facility]) CT imaging showed no evidence of disease However, the patient cannot be reassured She continues to have severe, persistent intermittent pelvic pain of which she thinks could be related to side effects of treatment The patient has been receiving dostarlimab since last year Immunotherapy is not the cause of pelvic pain It is possible she could have scar tissue or nerve damage related to surgery that manifest with pain intermittently After long discussion, she is undecided whether she wants to continue her treatment that is scheduled for 2 weeks I am not convinced that switching her to another brand would be helpful Ultimately, she is in agreement for referral to pain clinic for further evaluation   Pelvic pain She has intermittent severe pelvic pain that lasted for few days and started approximately a week after dostarlimab This is not a documented side effects of treatment She has been receiving the same treatment since a year ago CT imaging does not reveal any evidence of disease We discussed pain management but the patient is currently not interested to be prescribed medication for nerve pain or pain medication such as narcotic prescription I recommend referral to pain specialist for evaluation As above, I tried to reassure the patient that the treatment is not the cause of her symptoms  Other fatigue Recent TSH was normal The patient is wondering whether she might have B12 deficiency I will check B12 level in the next visit  Orders Placed This Encounter  Procedures   Vitamin B12    Standing Status:   Future    Standing Expiration Date:   02/27/2024   Ambulatory referral to Pain Clinic    Referral Priority:   Routine    Referral Type:   Consultation    Referral Reason:   Specialty Services Required    Referred  to Provider:   Edward Jolly, MD    Requested Specialty:   Pain Medicine    Number of Visits Requested:   1    All questions were answered. The patient knows to call the clinic with any problems, [name], discussions about plan of care and coordination of care plan   Artis Delay, MD 02/27/2023 9:30 AM  INTERVAL HISTORY: Please see below for problem oriented charting. she returns for treatment follow-up and review of test result She has some recent nasal congestion but tested negative for COVID We spent a lot of time reviewing results of her scan The patient felt debilitated, stated" I will be paralyzed if I keep taking this treatment" She has debilitating pelvic pain that lasted for 5 days approximately a week after treatment She has done extensive research and felt that the treatment could be causing this She stated this pelvic pain is causing difficulties with sitting and sleeping. It is helped by taking acetaminophen The patient is asking me to address the cause of her nasal congestion, her pelvic pain and others Despite explaining the limitation in scope of my practice, the patient is not satisfied.  She wanted to get to the bottom of everything Her labs are normal.  Her CT scan was normal. Yet, she is convinced that this pain is caused by something, similar to the side effects she had from chemo and  the symptoms she had when her cancer started  REVIEW OF SYSTEMS:   Constitutional: Denies fevers, [name], nose, mouth, throat, [name], [name], chest discomfort or lower extremity swelling Gastrointestinal:  Denies nausea, heartburn or change in bowel habits Skin: Denies  abnormal skin rashes Lymphatics: Denies new lymphadenopathy or easy bruising Behavioral/Psych: Mood is stable, no new changes  All other systems were reviewed with the patient and are negative.  I have reviewed the past medical history, past surgical history, social history and family history with the patient and they are unchanged from previous note.  ALLERGIES:  is allergic to seasonal ic [octacosanol], chlorhexidine gluconate, and latex.  MEDICATIONS:  Current Outpatient Medications  Medication Sig Dispense Refill   ascorbic acid (VITAMIN C) 250 MG CHEW Chew 500 mg by mouth daily.     calcium carbonate (TUMS - DOSED IN MG ELEMENTAL CALCIUM) 500 MG chewable tablet Chew 1 tablet by mouth daily.     Cholecalciferol (VITAMIN D3 PO) Take 1 tablet by mouth daily.     Multiple Vitamin (MULTIVITAMIN WITH MINERALS) TABS tablet Take 1 tablet by mouth daily.     SYNTHROID 75 MCG tablet Take 1 tablet (75 mcg) by mouth daily before breakfast. 30 tablet 1   No current facility-administered medications for this visit.    SUMMARY OF ONCOLOGIC HISTORY: Oncology History Overview Note  HER2 negative, Neg Genetics MMR Normal P53 positive   Uterine cancer (HCC)  12/03/2021 Initial Diagnosis   Patient reports several month history of intermittent pelvic pain that she describes as discomfort or sometimes cramping, that has worsened with time.  She notes that this is tolerable.  She began having postmenopausal bleeding started on 6/17 with a few spots of blood.     12/29/2021 Imaging   1. Bilateral large multiloculated cystic lesions of the ovaries, concerning for ovarian malignancy. Recommend gynecologic consultation and pelvic ultrasound and/or pelvic MRI with contrast for further evaluation. 2. Large heterogeneous mass of the posterior pelvis likely arising from the lower uterus, possibly a fibroid, although malignancy is also a concern. Recommend attention on pelvic ultrasound or MRI as above. 3.  Additional mass arising from the right uterine fundus which correlates with fibroid described on prior 2015 ultrasound. 4. Nonspecific small ground-glass nodule of the right lower lobe measuring 6 mm. Recommend follow-up chest CT in 6 months for further evaluation. 5. Aortic Atherosclerosis (ICD10-I70.0).   12/30/2021 Pathology Results   A. CERVIX, BIOPSY:  -  Scantly cellular specimen with predominantly blood, fibrin and acute inflammatory infiltrate but with scattered highly atypical cells highly suspicious for malignancy.   Note: A p16 shows strong positivity in these scattered cells; however, while p53 is mildly increased definitive strong overexpression or under expression is not identified.  These findings support the suspicion for malignancy; however, are insufficient for a definitive diagnosis.     01/03/2022 Imaging   MRI pelvis  1. Large mixed solid and cystic lesions of the bilateral ovaries, measuring 8.4 x 7.0 x 5.4 cm on the right and 10.3 x 8.7 x 8.1 cm on the left, highly concerning for primary ovarian malignancy and contralateral ovarian metastasis.   2. Homogeneously enhancing mass arising from the right aspect of the uterine body and fundus measuring 6.0 x 5.5 x 4.3 cm, consistent with a uterine fibroid and similar to prior ultrasound dated 2015.   3. An  additional, much more heterogeneous mass which appears to arise from the cervix or posterior lower uterine segment measuring 8.1 x 6.4 x 6.2 cm. This may reflect an additional fibroid with internal degeneration, however this was not clearly visualized on ultrasound dated 2015 and is highly suspicious for a cervical mass as significant enlargement of benign fibroids is not expected following menopause. This mass appears to closely abut and compress or perhaps even directly involve the adjacent rectum.   4.  No evidence of lymphadenopathy in the pelvis.   01/12/2022 PET scan   1. Hypermetabolic heterogeneous mass centered on the  cervix/lower uterine segment is most consistent with primary gynecologic neoplasm. 2. Multiloculated complex bilateral adnexal cystic lesions with hypermetabolic soft tissue component likely reflect metastatic lesions. 3. No hypermetabolic abdominopelvic adenopathy. 4. No evidence of hypermetabolic metastatic disease in the chest or abdomen.   01/18/2022 Pathology Results   FINAL MICROSCOPIC DIAGNOSIS:   A. CERVICAL MASS, BIOPSIES:  - Poorly differentiated carcinoma with necrosis, consistent with high-grade serous carcinoma, see comment   B. CERVICAL MASS, EXCISION:  - Poorly differentiated carcinoma with necrosis, consistent with high-grade serous carcinoma see comment  - Edges of resected tissue fragments are positive for carcinoma   COMMENT:   A and B.   The tumor cells are positive for PAX8, ER and p16 immunostains.  Immunostain for p53 shows significant overexpression. Immunostain for CK5/6 shows weak focal labeling while immunostain for  p63 is negative.  The immunoprofile is consistent with above interpretation.  The carcinoma is likely endometrial or ovarian origin with secondary involvement of the cervix.     01/23/2022 Initial Diagnosis   Uterine cancer (HCC)   01/23/2022 Cancer Staging   Staging form: Corpus Uteri - Carcinoma and Carcinosarcoma, AJCC 8th Edition - Clinical stage from 01/23/2022: FIGO Stage IIIA (cT3a, cN0, cM0) - Signed by Artis Delay, MD on 01/23/2022 Stage prefix: Initial diagnosis   01/31/2022 - 01/31/2022 Chemotherapy   Patient is on Treatment Plan : UTERINE ENDOMETRIAL Dostarlimab-gxly (500 mg) + Carboplatin (AUC 5) + Paclitaxel (175 mg/m2) q21d x 6 cycles / Dostarlimab-gxly (1000 mg) q42d x 6 cycles      01/31/2022 -  Chemotherapy   Patient is on Treatment Plan : UTERINE ENDOMETRIAL Dostarlimab-gxly (500 mg) + Carboplatin (AUC 5) + Paclitaxel (175 mg/m2) q21d x 6 cycles / Dostarlimab-gxly (1000 mg) q42d x 6 cycles      04/14/2022 PET scan   1. Significant  interval positive response to therapy. Mildly hypermetabolic solid 5.5 x 2.8 cm posterior uterine cervix mass and mildly hypermetabolic bilateral adnexal mixed cystic and solid masses are all significantly decreased in size and metabolism. 2. No new or progressive hypermetabolic metastatic disease. 3. Low level FDG uptake associated with healing subacute sclerotic nondisplaced anterior left third, fourth and fifth rib fractures without associated discrete osseous lesions, correlate for history of interval injury in this location. 4. Horseshoe kidney.   05/03/2022 Surgery   Robotic-assisted laparoscopic total hysterectomy with bilateral salpingo-oophorectomy, tumor debulking including infracolic omentectomy, mini-lap, cystoscopy, and vaginal laceration repair   Findings:  On speculum exam, normal-appearing cervix that is flush with the vaginal apex.  No visible cervical lesion.  On EUA, moderately mobile, enlarged uterus with mobile fullness appreciated in the cul-de-sac.  On intra-abdominal entry, normal upper abdominal survey.  Some adhesions of the omentum to the anterior abdominal wall below the umbilicus were noted.  Normal-appearing omentum, small and large bowel.  No ascites.  Right ovary minimally enlarged but overall normal in  appearance.  Left ovary replaced by a 6 cm cystic mass, smooth.  Normal-appearing bilateral fallopian tubes.  Bladder was somewhat adherent anteriorly to the cervix.  Uterus approximately 8 cm and deviated to the left given a 10 cm anterior and right-sided fibroid.  The cervix itself did not appear enlarged, either on intra-abdominal phonation or speculum exam.  Posteriorly, a white ridge of somewhat indurated tissue was noted underneath the cervix and just above the rectum.  On palpation, this ridge could be appreciated only on rectovaginal exam.  It was separate and free from the cervix itself.  It was difficult to distinguish whether this was treated tumor or active tumor  although its detached location from the cervix and uterus was noted.  Given my conversation with the patient prior to surgery and that she would not want an ostomy (even temporarily), there was no way to resect this area out without significant risk of damage to the rectum.  Given its location, even in the setting of a bowel prep (which she had not had), she would likely require at least a protective diverting ostomy. Cystoscopy, bladder dome noted to be intact and good efflux seen from bilateral ureteral orifices.   05/03/2022 Pathology Results   A. UTERUS, CERVIX, BILATERAL TUBES AND OVARIES, RESECTION:  - Cervix      Nabothian cysts      Negative for malignancy  - Endometrium      Inactive endometrium      Two benign endometrial polyps      Negative for malignancy  - Myometrium      Leiomyoma      Negative for malignancy  - Right ovary      Hemorrhagic cyst with fibrosis, hemosiderin and focal calcification      Negative for malignancy  - Left ovary      Hemorrhagic cyst with fibrosis, hemosiderin and focal calcification      Negative for malignancy  - Bilateral fallopian tubes      Unremarkable      Negative for malignancy  - See oncology table   B. OMENTUM, EXCISION:  - Benign omental adipose tissue.  - Negative for malignancy.     Genetic Testing   Negative genetic testing. No pathogenic variants identified on the [facility] CancerNext-Expanded+RNA panel. VUS in SDHA called [id number] identified. The report date is 05/03/2022.  The CancerNext-Expanded + RNAinsight gene panel offered by [employer] and includes sequencing and rearrangement analysis for the following 77 genes: IP, ALK, APC*, ATM*, AXIN2, BAP1, BARD1, BLM, BMPR1A, BRCA1*, BRCA2*, BRIP1*, CDC73, CDH1*,CDK4, CDKN1B, CDKN2A, CHEK2*, CTNNA1, DICER1, FANCC, FH, FLCN, GALNT12, KIF1B, LZTR1, MAX, MEN1, MET, MLH1*, MSH2*, MSH3, MSH6*, MUTYH*, NBN, NF1*, NF2, NTHL1, PALB2*, PHOX2B, PMS2*, POT1, PRKAR1A, PTCH1, PTEN*, RAD51C*,  RAD51D*,RB1, RECQL, RET, SDHA, SDHAF2, SDHB, SDHC, SDHD, SMAD4, SMARCA4, SMARCB1, SMARCE1, STK11, SUFU, TMEM127, TP53*,TSC1, TSC2, VHL and XRCC2 (sequencing and deletion/duplication); EGFR, EGLN1, HOXB13, KIT, MITF, PDGFRA, POLD1 and POLE (sequencing only); EPCAM and GREM1 (deletion/duplication only).   08/14/2022 Imaging   1. Prior hysterectomy without new suspicious enhancing soft tissue nodularity along the vaginal cuff. 2. Decreased size of the low-density focus along the left external iliac vessels, nonspecific. Interval decrease in size somewhat reassuring, and this is favored to reflect a small lymphocele or loculated fluid. However in the context of ongoing chemotherapy the interval decrease in size may reflect treatment response and as such while not favored metastatic disease can not be excluded. Continued attention on follow-up imaging is suggested. 3. No evidence  of new or progressive disease in the abdomen or pelvis. 4. Diffuse hepatic steatosis.   11/02/2022 Imaging   CT ABDOMEN PELVIS W CONTRAST  Result Date: 11/02/2022 CLINICAL DATA:  Pelvic pain. Uterine carcinoma. Evaluate for recurrence. * Tracking Code: BO * EXAM: CT ABDOMEN AND PELVIS WITH CONTRAST TECHNIQUE: Multidetector CT imaging of the abdomen and pelvis was performed using the standard protocol following bolus administration of intravenous contrast. RADIATION DOSE REDUCTION: This exam was performed according to the departmental dose-optimization program which includes automated exposure control, adjustment of the mA and/or kV according to patient size and/or use of iterative reconstruction technique. CONTRAST:  OMNIPAQUE IOHEXOL 300 MG/ML  SOLN COMPARISON:  08/11/2022 FINDINGS: Lower Chest: No acute findings. Hepatobiliary: No hepatic masses identified. Moderate diffuse hepatic steatosis. Gallbladder is unremarkable. No evidence of biliary ductal dilatation. Pancreas:  No mass or inflammatory changes. Spleen: Within normal  limits in size and appearance. 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 No suspicious masses identified. No evidence of ureteral calculi or hydronephrosis. Unremarkable unopacified urinary bladder. Stomach/Bowel: No evidence of obstruction, inflammatory process or abnormal fluid collections. Normal appendix visualized. Vascular/Lymphatic: No pathologically enlarged lymph nodes. No acute vascular findings. Reproductive: Prior hysterectomy noted. No evidence of pelvic mass or free fluid. No evidence of peritoneal or omental soft tissue nodularity. Other:  None. Musculoskeletal:  No suspicious bone lesions identified. IMPRESSION: No acute findings. No evidence of recurrent or metastatic carcinoma within the abdomen or pelvis. Hepatic steatosis. Horseshoe kidney. Electronically Signed   By: Danae Orleans M.D.   On: 11/02/2022 15:24      12/22/2022 Procedure   Successful removal of implanted Port-A-Cath.    02/20/2023 Imaging   CT ABDOMEN PELVIS W CONTRAST  Result Date: 02/26/2023 CLINICAL DATA:  Endometrial cancer restaging, abdominal and pelvic pain * Tracking Code: BO * EXAM: CT ABDOMEN AND PELVIS WITH CONTRAST TECHNIQUE: Multidetector CT imaging of the abdomen and pelvis was performed using the standard protocol following bolus administration of intravenous contrast. RADIATION DOSE REDUCTION: This exam was performed according to the departmental dose-optimization program which includes automated exposure control, adjustment of the mA and/or kV according to patient size and/or use of iterative reconstruction technique. CONTRAST:  OMNIPAQUE IOHEXOL 300 MG/ML  SOLN COMPARISON:  11/01/2022 FINDINGS: Lower chest: No acute abnormality. Hepatobiliary: No solid liver abnormality is seen. Severe hepatic steatosis. Hepatomegaly, maximum coronal span 20.6 cm. No gallstones, gallbladder wall thickening, or biliary dilatation. Pancreas: Unchanged atrophic, calcified appearance of the pancreatic tail. No  pancreatic ductal dilatation or surrounding inflammatory changes. Spleen: Normal in size without significant abnormality. Adrenals/Urinary Tract: Adrenal glands are unremarkable. Horseshoe kidney. No calculi or hydronephrosis. Bladder is unremarkable. Stomach/Bowel: Stomach is within normal limits. Appendix appears normal. No evidence of bowel wall thickening, distention, or inflammatory changes. Vascular/Lymphatic: Aortic atherosclerosis. No enlarged abdominal or pelvic lymph nodes. Reproductive: Status post hysterectomy. Other: No abdominal wall hernia or abnormality. No ascites. Musculoskeletal: No acute or significant osseous findings. IMPRESSION: 1. No acute CT findings of the abdomen or pelvis to explain abdominal pain. 2. Status post hysterectomy. No evidence of lymphadenopathy or metastatic disease in the abdomen or pelvis. 3. Severe hepatic steatosis and hepatomegaly. 4. Horseshoe kidney. Aortic Atherosclerosis (ICD10-I70.0). Electronically Signed   By: Jearld Lesch M.D.   On: 02/26/2023 13:54        PHYSICAL EXAMINATION: ECOG PERFORMANCE STATUS: 1 - Symptomatic but completely ambulatory  Vitals:   02/27/23 0818  BP: (!) 156/88  Pulse: 91  Resp: 18  Temp: 98.6  F (37 C)  SpO2: 100%   Filed Weights   02/27/23 0818  Weight: 138 lb 12.8 oz (63 kg)    GENERAL:alert, no distress and comfortable  LABORATORY DATA:  I have reviewed the data as listed    Component Value Date/Time   NA 139 01/30/2023 0746   K 4.0 01/30/2023 0746   CL 107 01/30/2023 0746   CO2 23 01/30/2023 0746   GLUCOSE 122 (H) 01/30/2023 0746   BUN 14 01/30/2023 0746   CREATININE 0.79 01/30/2023 0746   CALCIUM 9.1 01/30/2023 0746   PROT 7.1 01/30/2023 0746   ALBUMIN 4.3 01/30/2023 0746   AST 34 01/30/2023 0746   ALT 52 (H) 01/30/2023 0746   ALKPHOS 63 01/30/2023 0746   BILITOT 0.5 01/30/2023 0746   GFRNONAA >60 01/30/2023 0746    No results found for: "SPEP", "UPEP"  Lab Results  Component Value Date    WBC 7.0 01/30/2023   NEUTROABS 3.3 01/30/2023   HGB 12.4 01/30/2023   HCT 36.4 01/30/2023   MCV 74.4 (L) 01/30/2023   PLT 180 01/30/2023      Chemistry      Component Value Date/Time   NA 139 01/30/2023 0746   K 4.0 01/30/2023 0746   CL 107 01/30/2023 0746   CO2 23 01/30/2023 0746   BUN 14 01/30/2023 0746   CREATININE 0.79 01/30/2023 0746      Component Value Date/Time   CALCIUM 9.1 01/30/2023 0746   ALKPHOS 63 01/30/2023 0746   AST 34 01/30/2023 0746   ALT 52 (H) 01/30/2023 0746   BILITOT 0.5 01/30/2023 0746       RADIOGRAPHIC STUDIES: I have personally reviewed the radiological images as listed and agreed with the findings in the report. CT ABDOMEN PELVIS W CONTRAST  Result Date: 02/26/2023 CLINICAL DATA:  Endometrial cancer restaging, abdominal and pelvic pain * Tracking Code: BO * EXAM: CT ABDOMEN AND PELVIS WITH CONTRAST TECHNIQUE: Multidetector CT imaging of the abdomen and pelvis was performed using the standard protocol following bolus administration of intravenous contrast. RADIATION DOSE REDUCTION: This exam was performed according to the departmental dose-optimization program which includes automated exposure control, adjustment of the mA and/or kV according to patient size and/or use of iterative reconstruction technique. CONTRAST:  OMNIPAQUE IOHEXOL 300 MG/ML  SOLN COMPARISON:  11/01/2022 FINDINGS: Lower chest: No acute abnormality. Hepatobiliary: No solid liver abnormality is seen. Severe hepatic steatosis. Hepatomegaly, maximum coronal span 20.6 cm. No gallstones, gallbladder wall thickening, or biliary dilatation. Pancreas: Unchanged atrophic, calcified appearance of the pancreatic tail. No pancreatic ductal dilatation or surrounding inflammatory changes. Spleen: Normal in size without significant abnormality. Adrenals/Urinary Tract: Adrenal glands are unremarkable. Horseshoe kidney. No calculi or hydronephrosis. Bladder is unremarkable. Stomach/Bowel: Stomach  is within normal limits. Appendix appears normal. No evidence of bowel wall thickening, distention, or inflammatory changes. Vascular/Lymphatic: Aortic atherosclerosis. No enlarged abdominal or pelvic lymph nodes. Reproductive: Status post hysterectomy. Other: No abdominal wall hernia or abnormality. No ascites. Musculoskeletal: No acute or significant osseous findings. IMPRESSION: 1. No acute CT findings of the abdomen or pelvis to explain abdominal pain. 2. Status post hysterectomy. No evidence of lymphadenopathy or metastatic disease in the abdomen or pelvis. 3. Severe hepatic steatosis and hepatomegaly. 4. Horseshoe kidney. Aortic Atherosclerosis (ICD10-I70.0). Electronically Signed   By: Jearld Lesch M.D.   On: 02/26/2023 13:54

## 2023-02-27 NOTE — Assessment & Plan Note (Signed)
Recent TSH was normal The patient is wondering whether she might have B12 deficiency I will check B12 level in the next visit

## 2023-02-27 NOTE — Assessment & Plan Note (Signed)
She has intermittent severe pelvic pain that lasted for few days and started approximately a week after dostarlimab This is not a documented side effects of treatment She has been receiving the same treatment since a year ago CT imaging does not reveal any evidence of disease We discussed pain management but the patient is currently not interested to be prescribed medication for nerve pain or pain medication such as narcotic prescription I recommend referral to pain specialist for evaluation As above, I tried to reassure the patient that the treatment is not the cause of her symptoms

## 2023-02-27 NOTE — Assessment & Plan Note (Signed)
CT imaging showed no evidence of disease However, the patient cannot be reassured She continues to have severe, persistent intermittent pelvic pain of which she thinks could be related to side effects of treatment The patient has been receiving dostarlimab since last year Immunotherapy is not the cause of pelvic pain It is possible she could have scar tissue or nerve damage related to surgery that manifest with pain intermittently After long discussion, she is undecided whether she wants to continue her treatment that is scheduled for 2 weeks I am not convinced that switching her to another brand would be helpful Ultimately, she is in agreement for referral to pain clinic for further evaluation

## 2023-03-05 ENCOUNTER — Telehealth: Payer: Self-pay

## 2023-03-05 NOTE — Telephone Encounter (Signed)
Called and left a message asking her to call the office back.  Called pain clinic and physician is reviewing referral.

## 2023-03-05 NOTE — Telephone Encounter (Signed)
Hi Tiffany [name],  All the questions she has asked were addressed in her last visit 1) I am not a PCP; typically when patient presents with sweats, chills, cough and neg Covid, it is typically viral or allergy. But usually PCP can order other tests like flu test  2) I told her I was going to order B12 3) I created referral; please check on the referral status for pain management 4) I do not believe Jemperli is the cause of her symptoms since she has been receiving it since August 2023. We cannot switch her to Pembrolizumab since it is not being approved for use in her situation as a single agent 5) If she feels that Jemperli is the cause of all her problems (which I disagree) she should not keep her appt on 9/24  Final recommendation: she should consider second opinion elsewhere

## 2023-03-05 NOTE — Telephone Encounter (Signed)
Called and left a message asking her to call the office back.

## 2023-03-05 NOTE — Telephone Encounter (Signed)
Called and given below message. She verbalized understanding. Told her the pain clinic is reviewing the referral and will call her. She said that she will keep the appt on 9/24, her cold symptoms are getting better and she does not feel that she needs to see PCP. She will think about a referral for second opinion and get back to the office. She  is feeling depressed and down with everything. Offered to send a message to the Child psychotherapist, she declined and is already meeting with Rush Barer.  She wants to clarify her last office visit comment, when she said that she will be paralyzed she means that she feels depressed and tired with her physical symptoms. She feels that her symptoms make her to feel like she paralyzed at home and unable to have a good quality of life.

## 2023-03-05 NOTE — Telephone Encounter (Signed)
Returned her call. She is complaining that she is still having cold symptoms. She is having sweats, chills and loose cough. Home covid test negative on 9/9. She is taking OTC cough medication and feels that it is getting better everyday. Offered symptom management appt, she declined appt.  She is asking if Dr. Bertis Ruddy can order b12 with next lab appt? She is checking on pain management referral. She is asking if Jemperli can be changed to another medication with less side effects? She is asking if she should keep appt on 9/24 since she is having current symptoms?

## 2023-03-13 ENCOUNTER — Inpatient Hospital Stay: Payer: Medicaid Other

## 2023-03-13 ENCOUNTER — Encounter: Payer: Self-pay | Admitting: Hematology and Oncology

## 2023-03-13 ENCOUNTER — Telehealth: Payer: Self-pay

## 2023-03-13 ENCOUNTER — Inpatient Hospital Stay (HOSPITAL_BASED_OUTPATIENT_CLINIC_OR_DEPARTMENT_OTHER): Payer: Medicaid Other | Admitting: Hematology and Oncology

## 2023-03-13 VITALS — BP 171/93 | HR 83 | Temp 98.5°F | Resp 18 | Ht 63.78 in | Wt 137.6 lb

## 2023-03-13 VITALS — BP 155/86 | HR 82

## 2023-03-13 DIAGNOSIS — C539 Malignant neoplasm of cervix uteri, unspecified: Secondary | ICD-10-CM

## 2023-03-13 DIAGNOSIS — E538 Deficiency of other specified B group vitamins: Secondary | ICD-10-CM

## 2023-03-13 DIAGNOSIS — K760 Fatty (change of) liver, not elsewhere classified: Secondary | ICD-10-CM | POA: Diagnosis not present

## 2023-03-13 DIAGNOSIS — R030 Elevated blood-pressure reading, without diagnosis of hypertension: Secondary | ICD-10-CM | POA: Diagnosis not present

## 2023-03-13 DIAGNOSIS — B351 Tinea unguium: Secondary | ICD-10-CM | POA: Insufficient documentation

## 2023-03-13 DIAGNOSIS — Z5112 Encounter for antineoplastic immunotherapy: Secondary | ICD-10-CM | POA: Diagnosis not present

## 2023-03-13 LAB — CBC WITH DIFFERENTIAL (CANCER CENTER ONLY)
Abs Immature Granulocytes: 0.02 10*3/uL (ref 0.00–0.07)
Basophils Absolute: 0.1 10*3/uL (ref 0.0–0.1)
Basophils Relative: 1 %
Eosinophils Absolute: 0 10*3/uL (ref 0.0–0.5)
Eosinophils Relative: 1 %
HCT: 37.7 % (ref 36.0–46.0)
Hemoglobin: 12.5 g/dL (ref 12.0–15.0)
Immature Granulocytes: 0 %
Lymphocytes Relative: 41 %
Lymphs Abs: 2.6 10*3/uL (ref 0.7–4.0)
MCH: 25.2 pg — ABNORMAL LOW (ref 26.0–34.0)
MCHC: 33.2 g/dL (ref 30.0–36.0)
MCV: 76 fL — ABNORMAL LOW (ref 80.0–100.0)
Monocytes Absolute: 0.4 10*3/uL (ref 0.1–1.0)
Monocytes Relative: 7 %
Neutro Abs: 3.2 10*3/uL (ref 1.7–7.7)
Neutrophils Relative %: 50 %
Platelet Count: 201 10*3/uL (ref 150–400)
RBC: 4.96 MIL/uL (ref 3.87–5.11)
RDW: 13.5 % (ref 11.5–15.5)
WBC Count: 6.4 10*3/uL (ref 4.0–10.5)
nRBC: 0 % (ref 0.0–0.2)

## 2023-03-13 LAB — CMP (CANCER CENTER ONLY)
ALT: 60 U/L — ABNORMAL HIGH (ref 0–44)
AST: 52 U/L — ABNORMAL HIGH (ref 15–41)
Albumin: 4.5 g/dL (ref 3.5–5.0)
Alkaline Phosphatase: 65 U/L (ref 38–126)
Anion gap: 9 (ref 5–15)
BUN: 13 mg/dL (ref 6–20)
CO2: 23 mmol/L (ref 22–32)
Calcium: 9.4 mg/dL (ref 8.9–10.3)
Chloride: 106 mmol/L (ref 98–111)
Creatinine: 0.79 mg/dL (ref 0.44–1.00)
GFR, Estimated: >60 mL/min (ref >60)
Glucose, Bld: 130 mg/dL — ABNORMAL HIGH (ref 70–99)
Potassium: 3.7 mmol/L (ref 3.5–5.1)
Sodium: 138 mmol/L (ref 135–145)
Total Bilirubin: 0.6 mg/dL (ref 0.3–1.2)
Total Protein: 7.4 g/dL (ref 6.5–8.1)

## 2023-03-13 LAB — VITAMIN B12: Vitamin B-12: 634 pg/mL (ref 180–914)

## 2023-03-13 LAB — TSH: TSH: 2.965 u[IU]/mL (ref 0.350–4.500)

## 2023-03-13 MED ORDER — SODIUM CHLORIDE 0.9 % IV SOLN
1000.0000 mg | Freq: Once | INTRAVENOUS | Status: AC
  Administered 2023-03-13: 1000 mg via INTRAVENOUS
  Filled 2023-03-13: qty 20

## 2023-03-13 MED ORDER — SODIUM CHLORIDE 0.9 % IV SOLN: Freq: Once | INTRAVENOUS | Status: AC

## 2023-03-13 NOTE — Assessment & Plan Note (Signed)
She has intermittent elevated liver enzymes likely due to hepatic steatosis Monitor closely

## 2023-03-13 NOTE — Assessment & Plan Note (Signed)
Could be due to anxiety She is not symptomatic Observe

## 2023-03-13 NOTE — Assessment & Plan Note (Signed)
She has signs of fungal nail infection Recommend she reach out to her primary care doctor for treatment

## 2023-03-13 NOTE — Progress Notes (Signed)
[facility] OFFICE PROGRESS NOTE  Patient Care Team: Daisy Floro, MD as PCP - General (Family Medicine)  ASSESSMENT & PLAN:  Uterine cancer [facility]) CT imaging showed no evidence of disease She felt better for 7 to 10 days after each cycle of treatment which she blames on the immunotherapy despite having the same treatment for over a year After long discussion, she wants to continue her treatment today She is unsure whether she would want to continue treatment in the future but will call me back after she made her final decision  Hepatic steatosis She has intermittent elevated liver enzymes likely due to hepatic steatosis Monitor closely  Elevated blood-pressure reading without diagnosis of hypertension Could be due to anxiety She is not symptomatic Observe  Fungal nail infection She has signs of fungal nail infection Recommend she reach out to her primary care doctor for treatment  No orders of the defined types were placed in this encounter.   All questions were answered. The patient knows to call the clinic with any problems, questions or concerns. The total time spent in the appointment was 30 minutes encounter with patients including review of chart and various tests results, discussions about plan of care and coordination of care plan   Artis Delay, MD 03/13/2023 10:38 AM  INTERVAL HISTORY: Please see below for problem oriented charting. she returns for treatment follow-up We discussed plan of care again from our last visit We discussed risk and benefits of continuing treatment today and future follow-up She felt a bit better since last time I saw her She complained of fatigue She noticed some nail changes and on examination, is consistent with fungal nail infection  REVIEW OF SYSTEMS:   Constitutional: Denies fevers, [name], nose, mouth, throat, and face: Denies mucositis or sore  throat Respiratory: Denies cough, [name], chest discomfort or lower extremity swelling Gastrointestinal:  Denies nausea, heartburn or change in bowel habits Skin: Denies abnormal skin rashes Lymphatics: Denies new lymphadenopathy or easy bruising Neurological:Denies numbness, tingling or new weaknesses Behavioral/Psych: Mood is stable, no new changes  All other systems were reviewed with the patient and are negative.  I have reviewed the past medical history, past surgical history, social history and family history with the patient and they are unchanged from previous note.  ALLERGIES:  is allergic to seasonal ic [octacosanol], chlorhexidine gluconate, and latex.  MEDICATIONS:  Current Outpatient Medications  Medication Sig Dispense Refill   ascorbic acid (VITAMIN C) 250 MG CHEW Chew 500 mg by mouth daily.     calcium carbonate (TUMS - DOSED IN MG ELEMENTAL CALCIUM) 500 MG chewable tablet Chew 1 tablet by mouth daily.     Cholecalciferol (VITAMIN D3 PO) Take 1 tablet by mouth daily.     Multiple Vitamin (MULTIVITAMIN WITH MINERALS) TABS tablet Take 1 tablet by mouth daily.     SYNTHROID 75 MCG tablet Take 1 tablet (75 mcg) by mouth daily before breakfast. 30 tablet 1   No current facility-administered medications for this visit.   Facility-Administered Medications Ordered in Other Visits  Medication Dose Route Frequency Provider Last Rate Last Admin   dostarlimab-gxly (JEMPERLI) 1,000 mg in sodium chloride 0.9 % 100 mL (8.3333 mg/mL) chemo infusion  1,000 mg Intravenous Once Artis Delay, MD        SUMMARY OF ONCOLOGIC HISTORY: Oncology History Overview Note  HER2 negative, Neg Genetics MMR Normal P53 positive  Uterine cancer (HCC)  12/03/2021 Initial Diagnosis   Patient reports several month history of intermittent pelvic pain that she describes as discomfort or sometimes cramping, that has worsened with time.  She notes that this  is tolerable.  She began having postmenopausal bleeding started on 6/17 with a few spots of blood.     12/29/2021 Imaging   1. Bilateral large multiloculated cystic lesions of the ovaries, concerning for ovarian malignancy. Recommend gynecologic consultation and pelvic ultrasound and/or pelvic MRI with contrast for further evaluation. 2. Large heterogeneous mass of the posterior pelvis likely arising from the lower uterus, possibly a fibroid, although malignancy is also a concern. Recommend attention on pelvic ultrasound or MRI as above. 3. Additional mass arising from the right uterine fundus which correlates with fibroid described on prior 2015 ultrasound. 4. Nonspecific small ground-glass nodule of the right lower lobe measuring 6 mm. Recommend follow-up chest CT in 6 months for further evaluation. 5. Aortic Atherosclerosis (ICD10-I70.0).   12/30/2021 Pathology Results   A. CERVIX, BIOPSY:  -  Scantly cellular specimen with predominantly blood, fibrin and acute inflammatory infiltrate but with scattered highly atypical cells highly suspicious for malignancy.   Note: A p16 shows strong positivity in these scattered cells; however, while p53 is mildly increased definitive strong overexpression or under expression is not identified.  These findings support the suspicion for malignancy; however, are insufficient for a definitive diagnosis.     01/03/2022 Imaging   MRI pelvis  1. Large mixed solid and cystic lesions of the bilateral ovaries, measuring 8.4 x 7.0 x 5.4 cm on the right and 10.3 x 8.7 x 8.1 cm on the left, highly concerning for primary ovarian malignancy and contralateral ovarian metastasis.   2. Homogeneously enhancing mass arising from the right aspect of the uterine body and fundus measuring 6.0 x 5.5 x 4.3 cm, consistent with a uterine fibroid and similar to prior ultrasound dated 2015.   3. An additional, much more heterogeneous mass which appears to arise from the cervix or  posterior lower uterine segment measuring 8.1 x 6.4 x 6.2 cm. This may reflect an additional fibroid with internal degeneration, however this was not clearly visualized on ultrasound dated 2015 and is highly suspicious for a cervical mass as significant enlargement of benign fibroids is not expected following menopause. This mass appears to closely abut and compress or perhaps even directly involve the adjacent rectum.   4.  No evidence of lymphadenopathy in the pelvis.   01/12/2022 PET scan   1. Hypermetabolic heterogeneous mass centered on the cervix/lower uterine segment is most consistent with primary gynecologic neoplasm. 2. Multiloculated complex bilateral adnexal cystic lesions with hypermetabolic soft tissue component likely reflect metastatic lesions. 3. No hypermetabolic abdominopelvic adenopathy. 4. No evidence of hypermetabolic metastatic disease in the chest or abdomen.   01/18/2022 Pathology Results   FINAL MICROSCOPIC DIAGNOSIS:   A. CERVICAL MASS, BIOPSIES:  - Poorly differentiated carcinoma with necrosis, consistent with high-grade serous carcinoma, see comment   B. CERVICAL MASS, EXCISION:  - Poorly differentiated carcinoma with necrosis, consistent with high-grade serous carcinoma see comment  - Edges of resected tissue fragments are positive for carcinoma   COMMENT:   A and B.   The tumor cells are positive for PAX8, ER and p16 immunostains.  Immunostain for p53 shows significant overexpression. Immunostain for CK5/6 shows weak focal labeling while immunostain for  p63 is negative.  The immunoprofile is consistent with above interpretation.  The carcinoma is likely endometrial or ovarian  origin with secondary involvement of the cervix.     01/23/2022 Initial Diagnosis   Uterine cancer (HCC)   01/23/2022 Cancer Staging   Staging form: Corpus Uteri - Carcinoma and Carcinosarcoma, AJCC 8th Edition - Clinical stage from 01/23/2022: FIGO Stage IIIA (cT3a, cN0, cM0) - Signed by  Artis Delay, MD on 01/23/2022 Stage prefix: Initial diagnosis   01/31/2022 - 01/31/2022 Chemotherapy   Patient is on Treatment Plan : UTERINE ENDOMETRIAL Dostarlimab-gxly (500 mg) + Carboplatin (AUC 5) + Paclitaxel (175 mg/m2) q21d x 6 cycles / Dostarlimab-gxly (1000 mg) q42d x 6 cycles      01/31/2022 -  Chemotherapy   Patient is on Treatment Plan : UTERINE ENDOMETRIAL Dostarlimab-gxly (500 mg) + Carboplatin (AUC 5) + Paclitaxel (175 mg/m2) q21d x 6 cycles / Dostarlimab-gxly (1000 mg) q42d x 6 cycles      04/14/2022 PET scan   1. Significant interval positive response to therapy. Mildly hypermetabolic solid 5.5 x 2.8 cm posterior uterine cervix mass and mildly hypermetabolic bilateral adnexal mixed cystic and solid masses are all significantly decreased in size and metabolism. 2. No new or progressive hypermetabolic metastatic disease. 3. Low level FDG uptake associated with healing subacute sclerotic nondisplaced anterior left third, fourth and fifth rib fractures without associated discrete osseous lesions, correlate for history of interval injury in this location. 4. Horseshoe kidney.   05/03/2022 Surgery   Robotic-assisted laparoscopic total hysterectomy with bilateral salpingo-oophorectomy, tumor debulking including infracolic omentectomy, mini-lap, cystoscopy, and vaginal laceration repair   Findings:  On speculum exam, normal-appearing cervix that is flush with the vaginal apex.  No visible cervical lesion.  On EUA, moderately mobile, enlarged uterus with mobile fullness appreciated in the cul-de-sac.  On intra-abdominal entry, normal upper abdominal survey.  Some adhesions of the omentum to the anterior abdominal wall below the umbilicus were noted.  Normal-appearing omentum, small and large bowel.  No ascites.  Right ovary minimally enlarged but overall normal in appearance.  Left ovary replaced by a 6 cm cystic mass, smooth.  Normal-appearing bilateral fallopian tubes.  Bladder was somewhat  adherent anteriorly to the cervix.  Uterus approximately 8 cm and deviated to the left given a 10 cm anterior and right-sided fibroid.  The cervix itself did not appear enlarged, either on intra-abdominal phonation or speculum exam.  Posteriorly, a white ridge of somewhat indurated tissue was noted underneath the cervix and just above the rectum.  On palpation, this ridge could be appreciated only on rectovaginal exam.  It was separate and free from the cervix itself.  It was difficult to distinguish whether this was treated tumor or active tumor although its detached location from the cervix and uterus was noted.  Given my conversation with the patient prior to surgery and that she would not want an ostomy (even temporarily), there was no way to resect this area out without significant risk of damage to the rectum.  Given its location, even in the setting of a bowel prep (which she had not had), she would likely require at least a protective diverting ostomy. Cystoscopy, bladder dome noted to be intact and good efflux seen from bilateral ureteral orifices.   05/03/2022 Pathology Results   A. UTERUS, CERVIX, BILATERAL TUBES AND OVARIES, RESECTION:  - Cervix      Nabothian cysts      Negative for malignancy  - Endometrium      Inactive endometrium      Two benign endometrial polyps      Negative for malignancy  -  Myometrium      Leiomyoma      Negative for malignancy  - Right ovary      Hemorrhagic cyst with fibrosis, hemosiderin and focal calcification      Negative for malignancy  - Left ovary      Hemorrhagic cyst with fibrosis, hemosiderin and focal calcification      Negative for malignancy  - Bilateral fallopian tubes      Unremarkable      Negative for malignancy  - See oncology table   B. OMENTUM, EXCISION:  - Benign omental adipose tissue.  - Negative for malignancy.     Genetic Testing   Negative genetic testing. No pathogenic variants identified on the [facility]  CancerNext-Expanded+RNA panel. VUS in SDHA called [id number] identified. The report date is 05/03/2022.  The CancerNext-Expanded + RNAinsight gene panel offered by [employer] and includes sequencing and rearrangement analysis for the following 77 genes: IP, ALK, APC*, ATM*, AXIN2, BAP1, BARD1, BLM, BMPR1A, BRCA1*, BRCA2*, BRIP1*, CDC73, CDH1*,CDK4, CDKN1B, CDKN2A, CHEK2*, CTNNA1, DICER1, FANCC, FH, FLCN, GALNT12, KIF1B, LZTR1, MAX, MEN1, MET, MLH1*, MSH2*, MSH3, MSH6*, MUTYH*, NBN, NF1*, NF2, NTHL1, PALB2*, PHOX2B, PMS2*, POT1, PRKAR1A, PTCH1, PTEN*, RAD51C*, RAD51D*,RB1, RECQL, RET, SDHA, SDHAF2, SDHB, SDHC, SDHD, SMAD4, SMARCA4, SMARCB1, SMARCE1, STK11, SUFU, TMEM127, TP53*,TSC1, TSC2, VHL and XRCC2 (sequencing and deletion/duplication); EGFR, EGLN1, HOXB13, KIT, MITF, PDGFRA, POLD1 and POLE (sequencing only); EPCAM and GREM1 (deletion/duplication only).   08/14/2022 Imaging   1. Prior hysterectomy without new suspicious enhancing soft tissue nodularity along the vaginal cuff. 2. Decreased size of the low-density focus along the left external iliac vessels, nonspecific. Interval decrease in size somewhat reassuring, and this is favored to reflect a small lymphocele or loculated fluid. However in the context of ongoing chemotherapy the interval decrease in size may reflect treatment response and as such while not favored metastatic disease can not be excluded. Continued attention on follow-up imaging is suggested. 3. No evidence of new or progressive disease in the abdomen or pelvis. 4. Diffuse hepatic steatosis.   11/02/2022 Imaging   CT ABDOMEN PELVIS W CONTRAST  Result Date: 11/02/2022 CLINICAL DATA:  Pelvic pain. Uterine carcinoma. Evaluate for recurrence. * Tracking Code: BO * EXAM: CT ABDOMEN AND PELVIS WITH CONTRAST TECHNIQUE: Multidetector CT imaging of the abdomen and pelvis was performed using the standard protocol following bolus administration of intravenous contrast. RADIATION DOSE  REDUCTION: This exam was performed according to the departmental dose-optimization program which includes automated exposure control, adjustment of the mA and/or kV according to patient size and/or use of iterative reconstruction technique. CONTRAST:  OMNIPAQUE IOHEXOL 300 MG/ML  SOLN COMPARISON:  08/11/2022 FINDINGS: Lower Chest: No acute findings. Hepatobiliary: No hepatic masses identified. Moderate diffuse hepatic steatosis. Gallbladder is unremarkable. No evidence of biliary ductal dilatation. Pancreas:  No mass or inflammatory changes. Spleen: Within normal limits in size and appearance. 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 No suspicious masses identified. No evidence of ureteral calculi or hydronephrosis. Unremarkable unopacified urinary bladder. Stomach/Bowel: No evidence of obstruction, inflammatory process or abnormal fluid collections. Normal appendix visualized. Vascular/Lymphatic: No pathologically enlarged lymph nodes. No acute vascular findings. Reproductive: Prior hysterectomy noted. No evidence of pelvic mass or free fluid. No evidence of peritoneal or omental soft tissue nodularity. Other:  None. Musculoskeletal:  No suspicious bone lesions identified. IMPRESSION: No acute findings. No evidence of recurrent or metastatic carcinoma within the abdomen or pelvis. Hepatic steatosis. Horseshoe kidney. Electronically Signed   By: Danae Orleans M.D.   On: 11/02/2022  15:24      12/22/2022 Procedure   Successful removal of implanted Port-A-Cath.    02/20/2023 Imaging   CT ABDOMEN PELVIS W CONTRAST  Result Date: 02/26/2023 CLINICAL DATA:  Endometrial cancer restaging, abdominal and pelvic pain * Tracking Code: BO * EXAM: CT ABDOMEN AND PELVIS WITH CONTRAST TECHNIQUE: Multidetector CT imaging of the abdomen and pelvis was performed using the standard protocol following bolus administration of intravenous contrast. RADIATION DOSE REDUCTION: This exam was performed according to the  departmental dose-optimization program which includes automated exposure control, adjustment of the mA and/or kV according to patient size and/or use of iterative reconstruction technique. CONTRAST:  OMNIPAQUE IOHEXOL 300 MG/ML  SOLN COMPARISON:  11/01/2022 FINDINGS: Lower chest: No acute abnormality. Hepatobiliary: No solid liver abnormality is seen. Severe hepatic steatosis. Hepatomegaly, maximum coronal span 20.6 cm. No gallstones, gallbladder wall thickening, or biliary dilatation. Pancreas: Unchanged atrophic, calcified appearance of the pancreatic tail. No pancreatic ductal dilatation or surrounding inflammatory changes. Spleen: Normal in size without significant abnormality. Adrenals/Urinary Tract: Adrenal glands are unremarkable. Horseshoe kidney. No calculi or hydronephrosis. Bladder is unremarkable. Stomach/Bowel: Stomach is within normal limits. Appendix appears normal. No evidence of bowel wall thickening, distention, or inflammatory changes. Vascular/Lymphatic: Aortic atherosclerosis. No enlarged abdominal or pelvic lymph nodes. Reproductive: Status post hysterectomy. Other: No abdominal wall hernia or abnormality. No ascites. Musculoskeletal: No acute or significant osseous findings. IMPRESSION: 1. No acute CT findings of the abdomen or pelvis to explain abdominal pain. 2. Status post hysterectomy. No evidence of lymphadenopathy or metastatic disease in the abdomen or pelvis. 3. Severe hepatic steatosis and hepatomegaly. 4. Horseshoe kidney. Aortic Atherosclerosis (ICD10-I70.0). Electronically Signed   By: Jearld Lesch M.D.   On: 02/26/2023 13:54        PHYSICAL EXAMINATION: ECOG PERFORMANCE STATUS: 1 - Symptomatic but completely ambulatory  Vitals:   03/13/23 0928  BP: (!) 171/93  Pulse: 83  Resp: 18  Temp: 98.5 F (36.9 C)  SpO2: 100%   Filed Weights   03/13/23 0928  Weight: 137 lb 9.6 oz (62.4 kg)    GENERAL:alert, no distress and comfortable SKIN: Noted fungal nail  infection  LABORATORY DATA:  I have reviewed the data as listed    Component Value Date/Time   NA 138 03/13/2023 0857   K 3.7 03/13/2023 0857   CL 106 03/13/2023 0857   CO2 23 03/13/2023 0857   GLUCOSE 130 (H) 03/13/2023 0857   BUN 13 03/13/2023 0857   CREATININE 0.79 03/13/2023 0857   CALCIUM 9.4 03/13/2023 0857   PROT 7.4 03/13/2023 0857   ALBUMIN 4.5 03/13/2023 0857   AST 52 (H) 03/13/2023 0857   ALT 60 (H) 03/13/2023 0857   ALKPHOS 65 03/13/2023 0857   BILITOT 0.6 03/13/2023 0857   GFRNONAA >60 03/13/2023 0857    No results found for: "SPEP", "UPEP"  Lab Results  Component Value Date   WBC 6.4 03/13/2023   NEUTROABS 3.2 03/13/2023   HGB 12.5 03/13/2023   HCT 37.7 03/13/2023   MCV 76.0 (L) 03/13/2023   PLT 201 03/13/2023      Chemistry      Component Value Date/Time   NA 138 03/13/2023 0857   K 3.7 03/13/2023 0857   CL 106 03/13/2023 0857   CO2 23 03/13/2023 0857   BUN 13 03/13/2023 0857   CREATININE 0.79 03/13/2023 0857      Component Value Date/Time   CALCIUM 9.4 03/13/2023 0857   ALKPHOS 65 03/13/2023 0857   AST 52 (  H) 03/13/2023 0857   ALT 60 (H) 03/13/2023 0857   BILITOT 0.6 03/13/2023 0857

## 2023-03-13 NOTE — Assessment & Plan Note (Signed)
CT imaging showed no evidence of disease She felt better for 7 to 10 days after each cycle of treatment which she blames on the immunotherapy despite having the same treatment for over a year After long discussion, she wants to continue her treatment today She is unsure whether she would want to continue treatment in the future but will call me back after she made her final decision

## 2023-03-13 NOTE — Telephone Encounter (Signed)
-----   Message from Nurse Pernell Dupre sent at 03/13/2023  2:38 PM EDT -----  ----- Message ----- From: Artis Delay, MD Sent: 03/13/2023   2:06 PM EDT To: Morrell Riddle, RN  TSh and B12 are normal, let her know

## 2023-03-13 NOTE — Telephone Encounter (Signed)
Called pt and LVM letting pt know that her TSH and B12 levels are normal. Call back number [phone] provided.

## 2023-03-14 LAB — T4: T4, Total: 9.5 ug/dL (ref 4.5–12.0)

## 2023-03-22 ENCOUNTER — Other Ambulatory Visit (HOSPITAL_COMMUNITY): Payer: Self-pay

## 2023-03-22 ENCOUNTER — Telehealth: Payer: Self-pay

## 2023-03-22 NOTE — Telephone Encounter (Signed)
Pt called stating that she had attended a PCP visit as recommended by Dr. Bertis Ruddy for concerns of fingernail fungus. She states that her A1C was elevated and the PCP wants to put her on medication. Pt states that she just wants to know if Dr. Bertis Ruddy can approve or disapprove.    This RN called back and LVM stating that she would like to gather more information from pt. Call back number [phone] provided.

## 2023-03-23 ENCOUNTER — Other Ambulatory Visit: Payer: Self-pay | Admitting: Hematology and Oncology

## 2023-03-23 ENCOUNTER — Telehealth: Payer: Self-pay

## 2023-03-23 DIAGNOSIS — C539 Malignant neoplasm of cervix uteri, unspecified: Secondary | ICD-10-CM

## 2023-03-23 NOTE — Telephone Encounter (Signed)
Called pt to inform her that per Dr. Bertis Ruddy, it is OK to proceed with taking Metformin prescription. Pt verbalized understanding.   Pt requested proceeding with immunotherapy after Nov 9th- Dr. Bertis Ruddy made aware.

## 2023-03-25 ENCOUNTER — Other Ambulatory Visit: Payer: Self-pay

## 2023-03-30 ENCOUNTER — Encounter: Payer: Self-pay | Admitting: Hematology and Oncology

## 2023-04-05 ENCOUNTER — Encounter: Payer: Self-pay | Admitting: Hematology and Oncology

## 2023-04-06 ENCOUNTER — Other Ambulatory Visit: Payer: Self-pay

## 2023-04-10 ENCOUNTER — Other Ambulatory Visit (HOSPITAL_COMMUNITY): Payer: Self-pay

## 2023-04-10 ENCOUNTER — Other Ambulatory Visit: Payer: Self-pay | Admitting: Hematology and Oncology

## 2023-04-10 MED ORDER — SYNTHROID 75 MCG PO TABS
75.0000 ug | ORAL_TABLET | Freq: Every day | ORAL | 1 refills | Status: DC
  Filled 2023-04-10 – 2023-04-12 (×2): qty 30, 30d supply, fill #0

## 2023-04-12 ENCOUNTER — Other Ambulatory Visit: Payer: Self-pay

## 2023-04-13 ENCOUNTER — Other Ambulatory Visit (HOSPITAL_COMMUNITY): Payer: Self-pay

## 2023-04-13 ENCOUNTER — Other Ambulatory Visit: Payer: Self-pay

## 2023-04-13 ENCOUNTER — Inpatient Hospital Stay: Payer: BLUE CROSS/BLUE SHIELD | Attending: Gynecologic Oncology | Admitting: Gynecologic Oncology

## 2023-04-13 ENCOUNTER — Other Ambulatory Visit: Payer: Self-pay | Admitting: Gynecologic Oncology

## 2023-04-13 ENCOUNTER — Encounter: Payer: Self-pay | Admitting: Gynecologic Oncology

## 2023-04-13 VITALS — BP 166/91 | HR 93 | Temp 98.5°F | Resp 20 | Wt 137.0 lb

## 2023-04-13 DIAGNOSIS — C579 Malignant neoplasm of female genital organ, unspecified: Secondary | ICD-10-CM

## 2023-04-13 DIAGNOSIS — Z8542 Personal history of malignant neoplasm of other parts of uterus: Secondary | ICD-10-CM | POA: Diagnosis present

## 2023-04-13 DIAGNOSIS — F418 Other specified anxiety disorders: Secondary | ICD-10-CM

## 2023-04-13 MED ORDER — DIAZEPAM 5 MG PO TABS
5.0000 mg | ORAL_TABLET | Freq: Once | ORAL | 0 refills | Status: AC
Start: 2023-04-13 — End: 2023-04-13

## 2023-04-13 NOTE — Patient Instructions (Signed)
It was good to see you today.  I do not see or feel any evidence of cancer recurrence on your exam.  I will see you for follow-up in 4 months.  As always, if you develop any new and concerning symptoms before your next visit, please call to see me sooner.  

## 2023-04-13 NOTE — Progress Notes (Signed)
Gynecologic Oncology Return Clinic Visit  04/13/23  Reason for Visit: surveillance  Treatment History: Oncology History Overview Note  HER2 negative, Neg Genetics MMR Normal P53 positive   Uterine cancer (HCC)  12/03/2021 Initial Diagnosis   Patient reports several month history of intermittent pelvic pain that she describes as discomfort or sometimes cramping, that has worsened with time.  She notes that this is tolerable.  She began having postmenopausal bleeding started on 6/17 with a few spots of blood.     12/29/2021 Imaging   1. Bilateral large multiloculated cystic lesions of the ovaries, concerning for ovarian malignancy. Recommend gynecologic consultation and pelvic ultrasound and/or pelvic MRI with contrast for further evaluation. 2. Large heterogeneous mass of the posterior pelvis likely arising from the lower uterus, possibly a fibroid, although malignancy is also a concern. Recommend attention on pelvic ultrasound or MRI as above. 3. Additional mass arising from the right uterine fundus which correlates with fibroid described on prior 2015 ultrasound. 4. Nonspecific small ground-glass nodule of the right lower lobe measuring 6 mm. Recommend follow-up chest CT in 6 months for further evaluation. 5. Aortic Atherosclerosis (ICD10-I70.0).   12/30/2021 Pathology Results   A. CERVIX, BIOPSY:  -  Scantly cellular specimen with predominantly blood, fibrin and acute inflammatory infiltrate but with scattered highly atypical cells highly suspicious for malignancy.   Note: A p16 shows strong positivity in these scattered cells; however, while p53 is mildly increased definitive strong overexpression or under expression is not identified.  These findings support the suspicion for malignancy; however, are insufficient for a definitive diagnosis.     01/03/2022 Imaging   MRI pelvis  1. Large mixed solid and cystic lesions of the bilateral ovaries, measuring 8.4 x 7.0 x 5.4 cm on the right  and 10.3 x 8.7 x 8.1 cm on the left, highly concerning for primary ovarian malignancy and contralateral ovarian metastasis.   2. Homogeneously enhancing mass arising from the right aspect of the uterine body and fundus measuring 6.0 x 5.5 x 4.3 cm, consistent with a uterine fibroid and similar to prior ultrasound dated 2015.   3. An additional, much more heterogeneous mass which appears to arise from the cervix or posterior lower uterine segment measuring 8.1 x 6.4 x 6.2 cm. This may reflect an additional fibroid with internal degeneration, however this was not clearly visualized on ultrasound dated 2015 and is highly suspicious for a cervical mass as significant enlargement of benign fibroids is not expected following menopause. This mass appears to closely abut and compress or perhaps even directly involve the adjacent rectum.   4.  No evidence of lymphadenopathy in the pelvis.   01/12/2022 PET scan   1. Hypermetabolic heterogeneous mass centered on the cervix/lower uterine segment is most consistent with primary gynecologic neoplasm. 2. Multiloculated complex bilateral adnexal cystic lesions with hypermetabolic soft tissue component likely reflect metastatic lesions. 3. No hypermetabolic abdominopelvic adenopathy. 4. No evidence of hypermetabolic metastatic disease in the chest or abdomen.   01/18/2022 Pathology Results   FINAL MICROSCOPIC DIAGNOSIS:   A. CERVICAL MASS, BIOPSIES:  - Poorly differentiated carcinoma with necrosis, consistent with high-grade serous carcinoma, see comment   B. CERVICAL MASS, EXCISION:  - Poorly differentiated carcinoma with necrosis, consistent with high-grade serous carcinoma see comment  - Edges of resected tissue fragments are positive for carcinoma   COMMENT:   A and B.   The tumor cells are positive for PAX8, ER and p16 immunostains.  Immunostain for p53 shows significant overexpression. Immunostain  for CK5/6 shows weak focal labeling while immunostain for   p63 is negative.  The immunoprofile is consistent with above interpretation.  The carcinoma is likely endometrial or ovarian origin with secondary involvement of the cervix.     01/23/2022 Initial Diagnosis   Uterine cancer (HCC)   01/23/2022 Cancer Staging   Staging form: Corpus Uteri - Carcinoma and Carcinosarcoma, AJCC 8th Edition - Clinical stage from 01/23/2022: FIGO Stage IIIA (cT3a, cN0, cM0) - Signed by Artis Delay, MD on 01/23/2022 Stage prefix: Initial diagnosis   01/31/2022 - 01/31/2022 Chemotherapy   Patient is on Treatment Plan : UTERINE ENDOMETRIAL Dostarlimab-gxly (500 mg) + Carboplatin (AUC 5) + Paclitaxel (175 mg/m2) q21d x 6 cycles / Dostarlimab-gxly (1000 mg) q42d x 6 cycles      01/31/2022 -  Chemotherapy   Patient is on Treatment Plan : UTERINE ENDOMETRIAL Dostarlimab-gxly (500 mg) + Carboplatin (AUC 5) + Paclitaxel (175 mg/m2) q21d x 6 cycles / Dostarlimab-gxly (1000 mg) q42d x 6 cycles      04/14/2022 PET scan   1. Significant interval positive response to therapy. Mildly hypermetabolic solid 5.5 x 2.8 cm posterior uterine cervix mass and mildly hypermetabolic bilateral adnexal mixed cystic and solid masses are all significantly decreased in size and metabolism. 2. No new or progressive hypermetabolic metastatic disease. 3. Low level FDG uptake associated with healing subacute sclerotic nondisplaced anterior left third, fourth and fifth rib fractures without associated discrete osseous lesions, correlate for history of interval injury in this location. 4. Horseshoe kidney.   05/03/2022 Surgery   Robotic-assisted laparoscopic total hysterectomy with bilateral salpingo-oophorectomy, tumor debulking including infracolic omentectomy, mini-lap, cystoscopy, and vaginal laceration repair   Findings:  On speculum exam, normal-appearing cervix that is flush with the vaginal apex.  No visible cervical lesion.  On EUA, moderately mobile, enlarged uterus with mobile fullness appreciated in  the cul-de-sac.  On intra-abdominal entry, normal upper abdominal survey.  Some adhesions of the omentum to the anterior abdominal wall below the umbilicus were noted.  Normal-appearing omentum, small and large bowel.  No ascites.  Right ovary minimally enlarged but overall normal in appearance.  Left ovary replaced by a 6 cm cystic mass, smooth.  Normal-appearing bilateral fallopian tubes.  Bladder was somewhat adherent anteriorly to the cervix.  Uterus approximately 8 cm and deviated to the left given a 10 cm anterior and right-sided fibroid.  The cervix itself did not appear enlarged, either on intra-abdominal phonation or speculum exam.  Posteriorly, a white ridge of somewhat indurated tissue was noted underneath the cervix and just above the rectum.  On palpation, this ridge could be appreciated only on rectovaginal exam.  It was separate and free from the cervix itself.  It was difficult to distinguish whether this was treated tumor or active tumor although its detached location from the cervix and uterus was noted.  Given my conversation with the patient prior to surgery and that she would not want an ostomy (even temporarily), there was no way to resect this area out without significant risk of damage to the rectum.  Given its location, even in the setting of a bowel prep (which she had not had), she would likely require at least a protective diverting ostomy. Cystoscopy, bladder dome noted to be intact and good efflux seen from bilateral ureteral orifices.   05/03/2022 Pathology Results   A. UTERUS, CERVIX, BILATERAL TUBES AND OVARIES, RESECTION:  - Cervix      Nabothian cysts      Negative for  malignancy  - Endometrium      Inactive endometrium      Two benign endometrial polyps      Negative for malignancy  - Myometrium      Leiomyoma      Negative for malignancy  - Right ovary      Hemorrhagic cyst with fibrosis, hemosiderin and focal calcification      Negative for malignancy  - Left  ovary      Hemorrhagic cyst with fibrosis, hemosiderin and focal calcification      Negative for malignancy  - Bilateral fallopian tubes      Unremarkable      Negative for malignancy  - See oncology table   B. OMENTUM, EXCISION:  - Benign omental adipose tissue.  - Negative for malignancy.     Genetic Testing   Negative genetic testing. No pathogenic variants identified on the [facility] CancerNext-Expanded+RNA panel. VUS in SDHA called p.[id number] identified. The report date is 05/03/2022.  The CancerNext-Expanded + RNAinsight gene panel offered by [employer] and includes sequencing and rearrangement analysis for the following 77 genes: IP, ALK, APC*, ATM*, AXIN2, BAP1, BARD1, BLM, BMPR1A, BRCA1*, BRCA2*, BRIP1*, CDC73, CDH1*,CDK4, CDKN1B, CDKN2A, CHEK2*, CTNNA1, DICER1, FANCC, FH, FLCN, GALNT12, KIF1B, LZTR1, MAX, MEN1, MET, MLH1*, MSH2*, MSH3, MSH6*, MUTYH*, NBN, NF1*, NF2, NTHL1, PALB2*, PHOX2B, PMS2*, POT1, PRKAR1A, PTCH1, PTEN*, RAD51C*, RAD51D*,RB1, RECQL, RET, SDHA, SDHAF2, SDHB, SDHC, SDHD, SMAD4, SMARCA4, SMARCB1, SMARCE1, STK11, SUFU, TMEM127, TP53*,TSC1, TSC2, VHL and XRCC2 (sequencing and deletion/duplication); EGFR, EGLN1, HOXB13, KIT, MITF, PDGFRA, POLD1 and POLE (sequencing only); EPCAM and GREM1 (deletion/duplication only).   08/14/2022 Imaging   1. Prior hysterectomy without new suspicious enhancing soft tissue nodularity along the vaginal cuff. 2. Decreased size of the low-density focus along the left external iliac vessels, nonspecific. Interval decrease in size somewhat reassuring, and this is favored to reflect a small lymphocele or loculated fluid. However in the context of ongoing chemotherapy the interval decrease in size may reflect treatment response and as such while not favored metastatic disease can not be excluded. Continued attention on follow-up imaging is suggested. 3. No evidence of new or progressive disease in the abdomen or pelvis. 4. Diffuse hepatic  steatosis.   11/02/2022 Imaging   CT ABDOMEN PELVIS W CONTRAST  Result Date: 11/02/2022 CLINICAL DATA:  Pelvic pain. Uterine carcinoma. Evaluate for recurrence. * Tracking Code: BO * EXAM: CT ABDOMEN AND PELVIS WITH CONTRAST TECHNIQUE: Multidetector CT imaging of the abdomen and pelvis was performed using the standard protocol following bolus administration of intravenous contrast. RADIATION DOSE REDUCTION: This exam was performed according to the departmental dose-optimization program which includes automated exposure control, adjustment of the mA and/or kV according to patient size and/or use of iterative reconstruction technique. CONTRAST:  OMNIPAQUE IOHEXOL 300 MG/ML  SOLN COMPARISON:  08/11/2022 FINDINGS: Lower Chest: No acute findings. Hepatobiliary: No hepatic masses identified. Moderate diffuse hepatic steatosis. Gallbladder is unremarkable. No evidence of biliary ductal dilatation. Pancreas:  No mass or inflammatory changes. Spleen: Within normal limits in size and appearance. 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 No suspicious masses identified. No evidence of ureteral calculi or hydronephrosis. Unremarkable unopacified urinary bladder. Stomach/Bowel: No evidence of obstruction, inflammatory process or abnormal fluid collections. Normal appendix visualized. Vascular/Lymphatic: No pathologically enlarged lymph nodes. No acute vascular findings. Reproductive: Prior hysterectomy noted. No evidence of pelvic mass or free fluid. No evidence of peritoneal or omental soft tissue nodularity. Other:  None. Musculoskeletal:  No suspicious bone lesions identified. IMPRESSION: No acute  findings. No evidence of recurrent or metastatic carcinoma within the abdomen or pelvis. Hepatic steatosis. Horseshoe kidney. Electronically Signed   By: Danae Orleans M.D.   On: 11/02/2022 15:24      12/22/2022 Procedure   Successful removal of implanted Port-A-Cath.    02/20/2023 Imaging   CT ABDOMEN PELVIS  W CONTRAST  Result Date: 02/26/2023 CLINICAL DATA:  Endometrial cancer restaging, abdominal and pelvic pain * Tracking Code: BO * EXAM: CT ABDOMEN AND PELVIS WITH CONTRAST TECHNIQUE: Multidetector CT imaging of the abdomen and pelvis was performed using the standard protocol following bolus administration of intravenous contrast. RADIATION DOSE REDUCTION: This exam was performed according to the departmental dose-optimization program which includes automated exposure control, adjustment of the mA and/or kV according to patient size and/or use of iterative reconstruction technique. CONTRAST:  OMNIPAQUE IOHEXOL 300 MG/ML  SOLN COMPARISON:  11/01/2022 FINDINGS: Lower chest: No acute abnormality. Hepatobiliary: No solid liver abnormality is seen. Severe hepatic steatosis. Hepatomegaly, maximum coronal span 20.6 cm. No gallstones, gallbladder wall thickening, or biliary dilatation. Pancreas: Unchanged atrophic, calcified appearance of the pancreatic tail. No pancreatic ductal dilatation or surrounding inflammatory changes. Spleen: Normal in size without significant abnormality. Adrenals/Urinary Tract: Adrenal glands are unremarkable. Horseshoe kidney. No calculi or hydronephrosis. Bladder is unremarkable. Stomach/Bowel: Stomach is within normal limits. Appendix appears normal. No evidence of bowel wall thickening, distention, or inflammatory changes. Vascular/Lymphatic: Aortic atherosclerosis. No enlarged abdominal or pelvic lymph nodes. Reproductive: Status post hysterectomy. Other: No abdominal wall hernia or abnormality. No ascites. Musculoskeletal: No acute or significant osseous findings. IMPRESSION: 1. No acute CT findings of the abdomen or pelvis to explain abdominal pain. 2. Status post hysterectomy. No evidence of lymphadenopathy or metastatic disease in the abdomen or pelvis. 3. Severe hepatic steatosis and hepatomegaly. 4. Horseshoe kidney. Aortic Atherosclerosis (ICD10-I70.0). Electronically Signed    By: Jearld Lesch M.D.   On: 02/26/2023 13:54        Interval History: Patient reports overall doing well.  She continues to have intermittent pelvic pain, no more frequent than previously.  This tends to happen around the time of immunotherapy.  Recently, she had to take some pain medication when this happened.  Denies any vaginal bleeding or discharge.  Reports normal bowel function without the need of bowel regimen.  Denies any urinary symptoms.  She was recently started on metformin secondary to diabetes, recent hemoglobin A1c was 7.4%.  He continues on Synthyroid for her hypothyroidism.  She is finally going on a trip to [place]  [place], a bucket list trip that she had planned prior to her diagnosis.  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   oncologist-- dr Bertis Ruddy;   dx 06/ 2023   Multinodular thyroid    bilateral nodules ;  hx bx 07-31-2017 benign ;  last ultrasound in epic 01-08-2020   Neuropathy due to chemotherapeutic drug [facility])    Uterine cancer [facility]) 01/2022   gyn oncologist-- dr Cohen Boettner/  oncologist--- dr Bertis Ruddy;  dx 01-23-2022,  started chemo 01-31-2022    Past Surgical History:  Procedure Laterality Date   CESAREAN SECTION     COLONOSCOPY  2016   dr Fayrene Fearing [facility])   CYSTOSCOPY N/A 05/03/2022   Procedure: CYSTOSCOPY;  Surgeon: Carver Fila,  MD;  Location: [facility];  Service: Gynecology;  Laterality: N/A;   DILATION AND EVACUATION  08/17/2000   @[facility]  for missed ab   IR IMAGING GUIDED PORT INSERTION  01/25/2022   IR REMOVAL TUN ACCESS W/ PORT W/O FL MOD SED  12/22/2022   LESION REMOVAL N/A 01/18/2022   Procedure: CERVICAL BIOPSIES;  Surgeon: Carver Fila, MD;  Location: [facility];  Service: Gynecology;  Laterality: N/A;   ROBOTIC  ASSISTED TOTAL HYSTERECTOMY WITH BILATERAL SALPINGO OOPHERECTOMY  05/03/2022   @[facility]  by dr Pricilla Holm ;   with mini lap, omentectomy, tumor debulking    Family History  Problem Relation Age of Onset   Thyroid disease Mother    Breast cancer Mother        dx late 26s   Cancer Other        unk type, d. 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  Tobacco Use   Smoking status: Never   Smokeless tobacco: Never  Vaping Use   Vaping status: Never Used  Substance and Sexual Activity   Alcohol use: Never   Drug use: Never   Sexual activity: Not Currently  Other Topics Concern   Not on file  Social History Narrative   Not on file   Social Determinants of Health   Financial Resource Strain: Not on file  Food Insecurity: No Food Insecurity (05/03/2022)   Hunger Vital Sign    Worried About Running Out of Food in the Last Year: Never true    Ran Out of Food in the Last Year: Never true  Transportation Needs: No Transportation Needs (05/03/2022)   PRAPARE - Administrator, Civil Service (Medical): No    Lack of Transportation (Non-Medical): No  Physical Activity: Not on file  Stress: Not on file  Social Connections: Unknown (01/05/2022)   Received from [facility]   Social Network    Social Network: Not on file    Current Medications:  Current Outpatient Medications:    ascorbic acid (VITAMIN C) 250 MG CHEW, Chew 500 mg by mouth daily., Disp: , Rfl:    calcium carbonate (TUMS - DOSED IN MG ELEMENTAL CALCIUM) 500 MG chewable tablet, Chew 1 tablet by mouth daily., Disp: , Rfl:     Cholecalciferol (VITAMIN D3 PO), Take 1 tablet by mouth daily., Disp: , Rfl:    metFORMIN (GLUCOPHAGE-XR) 500 MG 24 hr tablet, Take 1 tablet by mouth twice daily. Increase to 2 tablets twice daily after 1 week. Take with food, Disp: , Rfl:    Multiple Vitamin (MULTIVITAMIN WITH MINERALS) TABS tablet, Take 1 tablet by mouth daily., Disp: , Rfl:    SYNTHROID 75 MCG tablet, Take 1 tablet (75 mcg) by mouth daily before breakfast., Disp: 30 tablet, Rfl: 1   diazepam (VALIUM) 5 MG tablet, Take 1 tablet (5 mg total) by mouth once for 1 dose. Take 30 minutes prior to appointment. Do not take and drive, Disp: 1 tablet, Rfl: 0  Review of Systems: + Pelvic pain, confusion Denies appetite changes, fevers, chills, fatigue, unexplained weight changes. Denies hearing loss, neck lumps or masses, mouth sores, ringing in ears or voice changes. Denies cough or wheezing.  Denies shortness of breath. Denies chest pain or palpitations. Denies leg swelling. Denies abdominal distention, pain, blood in stools, constipation, diarrhea, nausea, vomiting, or early satiety. Denies pain with intercourse, dysuria, frequency, hematuria or incontinence. Denies hot flashes, vaginal bleeding or vaginal discharge.   Denies joint pain, back pain or muscle pain/cramps. Denies itching, rash, or wounds. Denies dizziness, headaches, numbness or seizures. Denies swollen lymph nodes or glands, denies easy bruising or bleeding. Denies anxiety, depression, or decreased concentration.  Physical Exam: BP (!) 166/91 (BP Location: Left Arm, Patient Position: Sitting)   Pulse 93   Temp 98.5 F (36.9 C) (Oral)   Resp 20   Wt 137 lb (62.1 kg)   SpO2 100%   BMI 23.68 kg/m  General: Alert, oriented, no acute distress. HEENT: Normocephalic, atraumatic, sclera anicteric.  1 shotty mandibular lymph node. Chest: Unlabored breathing on room air.  Lungs clear to auscultation bilaterally, no wheezes or rhonchi. Cardiovascular: Regular rate and  rhythm, no murmurs or rubs appreciated. Abdomen: soft, nontender.  Normoactive bowel sounds.  No masses or hepatosplenomegaly appreciated.  Well-healed incisions. Extremities: Grossly normal range of motion.  Warm, well perfused.  No edema bilaterally. Lymphatics: No inguinal, clavicular neuropathy. GU: Normal appearing external genitalia without erythema, excoriation, or lesions.  Speculum exam with extra small speculum is somewhat tolerated by the patient, improved from prior exams.  I am able to insert the speculum most of the length of the vagina and open it minimally.  No obvious lesions noted, mucsoa normal in appearance.  On single digit bimanual exam, I am not quite able to reach the apex but there is no nodularity or masses appreciated.  Some agglutination of the posterior introitus.  Rectal exam without nodularity palpated along the rectovaginal septum.  Laboratory & Radiologic Studies: CT A/P on 02/20/23: 1. No acute CT findings of the abdomen or pelvis to explain abdominal pain. 2. Status post hysterectomy. No evidence of lymphadenopathy or metastatic disease in the abdomen or pelvis. 3. Severe hepatic steatosis and hepatomegaly. 4. Horseshoe kidney.  Assessment & Plan: Tiffany [name] is a 62 y.o. 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   The patient is doing very well.  She is NED on exam today.  Recent CT scan was negative for any metastatic disease.  She is overall tolerating maintenance immunotherapy well with some immunotherapy related side effects, currently well controlled.   Most recent CT scan is negative for recurrent disease.  Pelvic exam continues to be somewhat challenging although is better tolerated with each subsequent exam with premedication.   We reviewed signs and symptoms that would be concerning for disease recurrence.   I stressed the importance of calling if she develops any of these between visits.  I will plan to see her back for follow-up in 4 months.  22 minutes of total time was spent for this patient encounter, including preparation, face-to-face counseling with the patient and coordination of care, and documentation of the encounter.  Eugene Garnet, MD  Division of Gynecologic Oncology  Department of Obstetrics and Gynecology  [facility]

## 2023-04-14 ENCOUNTER — Other Ambulatory Visit: Payer: Self-pay

## 2023-04-20 ENCOUNTER — Encounter: Payer: Self-pay | Admitting: Hematology and Oncology

## 2023-04-23 ENCOUNTER — Encounter: Payer: Self-pay | Admitting: Hematology and Oncology

## 2023-04-30 ENCOUNTER — Telehealth: Payer: Self-pay

## 2023-04-30 NOTE — Telephone Encounter (Signed)
Returned her call. She went to [place] on vacation and returned home yesterday. While on vacation her allergies acted up due to the high altitude. She had watery eyes and a runny nose mainly. Since arriving home she is having just sinus drainage and a cough at times. She is taking Allegra and some cough medication. Denies fever, headache and any other symptoms. She is asking if she should keep appt for tomorrow. Told her to keep appt and call the office back for concerns.  FYI

## 2023-04-30 NOTE — Telephone Encounter (Signed)
Returned her call and left a message asking her to call the office back. 

## 2023-05-01 ENCOUNTER — Inpatient Hospital Stay: Payer: Medicaid Other

## 2023-05-01 ENCOUNTER — Encounter: Payer: Self-pay | Admitting: Hematology and Oncology

## 2023-05-01 ENCOUNTER — Inpatient Hospital Stay: Payer: Medicaid Other | Attending: Gynecologic Oncology

## 2023-05-01 ENCOUNTER — Inpatient Hospital Stay (HOSPITAL_BASED_OUTPATIENT_CLINIC_OR_DEPARTMENT_OTHER): Payer: Medicaid Other | Admitting: Hematology and Oncology

## 2023-05-01 VITALS — BP 165/88 | HR 77 | Resp 16

## 2023-05-01 VITALS — BP 185/105 | HR 84 | Temp 98.0°F | Resp 10 | Ht 63.78 in | Wt 135.8 lb

## 2023-05-01 DIAGNOSIS — R42 Dizziness and giddiness: Secondary | ICD-10-CM | POA: Insufficient documentation

## 2023-05-01 DIAGNOSIS — C539 Malignant neoplasm of cervix uteri, unspecified: Secondary | ICD-10-CM

## 2023-05-01 DIAGNOSIS — N83201 Unspecified ovarian cyst, right side: Secondary | ICD-10-CM | POA: Diagnosis not present

## 2023-05-01 DIAGNOSIS — R030 Elevated blood-pressure reading, without diagnosis of hypertension: Secondary | ICD-10-CM

## 2023-05-01 DIAGNOSIS — Z79890 Hormone replacement therapy (postmenopausal): Secondary | ICD-10-CM | POA: Diagnosis not present

## 2023-05-01 DIAGNOSIS — Z5112 Encounter for antineoplastic immunotherapy: Secondary | ICD-10-CM | POA: Diagnosis present

## 2023-05-01 DIAGNOSIS — C55 Malignant neoplasm of uterus, part unspecified: Secondary | ICD-10-CM | POA: Diagnosis present

## 2023-05-01 DIAGNOSIS — E039 Hypothyroidism, unspecified: Secondary | ICD-10-CM | POA: Diagnosis not present

## 2023-05-01 DIAGNOSIS — R160 Hepatomegaly, not elsewhere classified: Secondary | ICD-10-CM | POA: Diagnosis not present

## 2023-05-01 DIAGNOSIS — Q631 Lobulated, fused and horseshoe kidney: Secondary | ICD-10-CM | POA: Diagnosis not present

## 2023-05-01 DIAGNOSIS — K760 Fatty (change of) liver, not elsewhere classified: Secondary | ICD-10-CM | POA: Diagnosis not present

## 2023-05-01 DIAGNOSIS — I700 Atherosclerosis of aorta: Secondary | ICD-10-CM | POA: Insufficient documentation

## 2023-05-01 DIAGNOSIS — Z79620 Long term (current) use of immunosuppressive biologic: Secondary | ICD-10-CM | POA: Diagnosis not present

## 2023-05-01 DIAGNOSIS — N83202 Unspecified ovarian cyst, left side: Secondary | ICD-10-CM | POA: Diagnosis not present

## 2023-05-01 DIAGNOSIS — J309 Allergic rhinitis, unspecified: Secondary | ICD-10-CM | POA: Insufficient documentation

## 2023-05-01 DIAGNOSIS — N950 Postmenopausal bleeding: Secondary | ICD-10-CM | POA: Insufficient documentation

## 2023-05-01 LAB — CBC WITH DIFFERENTIAL (CANCER CENTER ONLY)
Abs Immature Granulocytes: 0.01 10*3/uL (ref 0.00–0.07)
Basophils Absolute: 0 10*3/uL (ref 0.0–0.1)
Basophils Relative: 1 %
Eosinophils Absolute: 0.1 10*3/uL (ref 0.0–0.5)
Eosinophils Relative: 2 %
HCT: 36.9 % (ref 36.0–46.0)
Hemoglobin: 12.6 g/dL (ref 12.0–15.0)
Immature Granulocytes: 0 %
Lymphocytes Relative: 44 %
Lymphs Abs: 2.8 10*3/uL (ref 0.7–4.0)
MCH: 25.3 pg — ABNORMAL LOW (ref 26.0–34.0)
MCHC: 34.1 g/dL (ref 30.0–36.0)
MCV: 74.1 fL — ABNORMAL LOW (ref 80.0–100.0)
Monocytes Absolute: 0.3 10*3/uL (ref 0.1–1.0)
Monocytes Relative: 5 %
Neutro Abs: 3 10*3/uL (ref 1.7–7.7)
Neutrophils Relative %: 48 %
Platelet Count: 199 10*3/uL (ref 150–400)
RBC: 4.98 MIL/uL (ref 3.87–5.11)
RDW: 14.1 % (ref 11.5–15.5)
WBC Count: 6.4 10*3/uL (ref 4.0–10.5)
nRBC: 0 % (ref 0.0–0.2)

## 2023-05-01 LAB — CMP (CANCER CENTER ONLY)
ALT: 31 U/L (ref 0–44)
AST: 25 U/L (ref 15–41)
Albumin: 4.3 g/dL (ref 3.5–5.0)
Alkaline Phosphatase: 71 U/L (ref 38–126)
Anion gap: 8 (ref 5–15)
BUN: 13 mg/dL (ref 8–23)
CO2: 21 mmol/L — ABNORMAL LOW (ref 22–32)
Calcium: 9.1 mg/dL (ref 8.9–10.3)
Chloride: 110 mmol/L (ref 98–111)
Creatinine: 0.74 mg/dL (ref 0.44–1.00)
GFR, Estimated: >60 mL/min (ref >60)
Glucose, Bld: 131 mg/dL — ABNORMAL HIGH (ref 70–99)
Potassium: 3.6 mmol/L (ref 3.5–5.1)
Sodium: 139 mmol/L (ref 135–145)
Total Bilirubin: 0.4 mg/dL (ref <1.2)
Total Protein: 7.1 g/dL (ref 6.5–8.1)

## 2023-05-01 LAB — TSH: TSH: 2.586 u[IU]/mL (ref 0.350–4.500)

## 2023-05-01 MED ORDER — SODIUM CHLORIDE 0.9 % IV SOLN: Freq: Once | INTRAVENOUS | Status: AC

## 2023-05-01 MED ORDER — SODIUM CHLORIDE 0.9 % IV SOLN
1000.0000 mg | Freq: Once | INTRAVENOUS | Status: AC
  Administered 2023-05-01: 1000 mg via INTRAVENOUS
  Filled 2023-05-01: qty 20

## 2023-05-01 NOTE — Progress Notes (Signed)
Ok to treat with elevated blood pressure

## 2023-05-01 NOTE — Assessment & Plan Note (Signed)
Blood pressure is very high today suspect is the cause of her dizziness and not her recent treatment Her blood pressure has improved in the infusion room I recommend the patient to continue to monitor her blood pressure closely at home

## 2023-05-01 NOTE — Assessment & Plan Note (Signed)
She had recent symptoms of allergic rhinitis that has improved She will continue over-the-counter remedies This is not caused by her treatment

## 2023-05-01 NOTE — Assessment & Plan Note (Signed)
We discussed expected side effects of immunotherapy including intermittent changes to her thyroid function I will adjust the dose of her synthroid dose accordingly

## 2023-05-01 NOTE — Progress Notes (Signed)
[facility] OFFICE PROGRESS NOTE  Patient Care Team: Daisy Floro, MD as PCP - General (Family Medicine)  ASSESSMENT & PLAN:  Uterine cancer [facility]) Her last CT imaging showed no evidence of disease She will continue maintenance immunotherapy I plan to repeat imaging study again next year  Elevated blood-pressure reading without diagnosis of hypertension Blood pressure is very high today suspect is the cause of her dizziness and not her recent treatment Her blood pressure has improved in the infusion room I recommend the patient to continue to monitor her blood pressure closely at home  Allergic rhinitis She had recent symptoms of allergic rhinitis that has improved She will continue over-the-counter remedies This is not caused by her treatment  Hypothyroidism We discussed expected side effects of immunotherapy including intermittent changes to her thyroid function I will adjust the dose of her synthroid dose accordingly  Orders Placed This Encounter  Procedures   CBC with Differential (Cancer Center Only)    Standing Status:   Future    Standing Expiration Date:   06/20/2024   CMP (Cancer Center only)    Standing Status:   Future    Standing Expiration Date:   06/20/2024   T4    Standing Status:   Future    Standing Expiration Date:   06/20/2024   TSH    Standing Status:   Future    Standing Expiration Date:   06/20/2024    All questions were answered. The patient knows to call the clinic with any problems, questions or concerns. The total time spent in the appointment was 30 minutes encounter with patients including review of chart and various tests results, discussions about plan of care and coordination of care plan   Artis Delay, MD 05/01/2023 9:50 AM  INTERVAL HISTORY: Please see below for problem oriented charting. she returns for treatment today She just returned from [place] When she was in [place], she developed significant allergic rhinitis, nasal  drip, watery eyes and others She took some over-the-counter antihistamines that was not helpful She felt very dizzy this morning Her blood pressure is elevated today but much better at home We discussed risk and benefits of continuing treatment today  REVIEW OF SYSTEMS:   Constitutional: Denies fevers, [name], nose, mouth, throat, [name], [name], chest discomfort or lower extremity swelling Gastrointestinal:  Denies nausea, [name], no new changes  All other systems were reviewed with the patient and are negative.  I have reviewed the past medical history, past surgical history, social history and family history with the patient and they are unchanged from previous note.  ALLERGIES:  is allergic to seasonal ic [octacosanol], chlorhexidine gluconate, and latex.  MEDICATIONS:  Current Outpatient Medications  Medication Sig Dispense Refill   ascorbic acid (VITAMIN C) 250 MG CHEW Chew 500 mg by mouth daily.     calcium carbonate (TUMS - DOSED IN MG ELEMENTAL CALCIUM) 500 MG chewable tablet Chew 1 tablet by mouth daily.     Cholecalciferol (VITAMIN D3 PO) Take 1 tablet by mouth daily.     metFORMIN (GLUCOPHAGE-XR) 500 MG 24 hr tablet Take 1 tablet by mouth twice daily. Increase to 2 tablets twice daily after 1 week. Take with food  Multiple Vitamin (MULTIVITAMIN WITH MINERALS) TABS tablet Take 1 tablet by mouth daily.     SYNTHROID 75 MCG tablet Take 1 tablet (75 mcg) by mouth daily before breakfast. 30 tablet 1   No current facility-administered medications for this visit.   Facility-Administered Medications Ordered in Other Visits  Medication Dose Route Frequency  Provider Last Rate Last Admin   0.9 %  sodium chloride infusion   Intravenous Once Bertis Ruddy, Akiera Allbaugh, MD       dostarlimab-gxly (JEMPERLI) 1,000 mg in sodium chloride 0.9 % 100 mL (8.3333 mg/mL) chemo infusion  1,000 mg Intravenous Once Bertis Ruddy, Eliud Polo, MD        SUMMARY OF ONCOLOGIC HISTORY: Oncology History Overview Note  HER2 negative, Neg Genetics MMR Normal P53 positive   Uterine cancer (HCC)  12/03/2021 Initial Diagnosis   Patient reports several month history of intermittent pelvic pain that she describes as discomfort or sometimes cramping, that has worsened with time.  She notes that this is tolerable.  She began having postmenopausal bleeding started on 6/17 with a few spots of blood.     12/29/2021 Imaging   1. Bilateral large multiloculated cystic lesions of the ovaries, concerning for ovarian malignancy. Recommend gynecologic consultation and pelvic ultrasound and/or pelvic MRI with contrast for further evaluation. 2. Large heterogeneous mass of the posterior pelvis likely arising from the lower uterus, possibly a fibroid, although malignancy is also a concern. Recommend attention on pelvic ultrasound or MRI as above. 3. Additional mass arising from the right uterine fundus which correlates with fibroid described on prior 2015 ultrasound. 4. Nonspecific small ground-glass nodule of the right lower lobe measuring 6 mm. Recommend follow-up chest CT in 6 months for further evaluation. 5. Aortic Atherosclerosis (ICD10-I70.0).   12/30/2021 Pathology Results   A. CERVIX, BIOPSY:  -  Scantly cellular specimen with predominantly blood, fibrin and acute inflammatory infiltrate but with scattered highly atypical cells highly suspicious for malignancy.   Note: A p16 shows strong positivity in these scattered cells; however, while p53 is mildly increased definitive strong overexpression or under expression is not identified.  These findings support the suspicion for malignancy; however, are  insufficient for a definitive diagnosis.     01/03/2022 Imaging   MRI pelvis  1. Large mixed solid and cystic lesions of the bilateral ovaries, measuring 8.4 x 7.0 x 5.4 cm on the right and 10.3 x 8.7 x 8.1 cm on the left, highly concerning for primary ovarian malignancy and contralateral ovarian metastasis.   2. Homogeneously enhancing mass arising from the right aspect of the uterine body and fundus measuring 6.0 x 5.5 x 4.3 cm, consistent with a uterine fibroid and similar to prior ultrasound dated 2015.   3. An additional, much more heterogeneous mass which appears to arise from the cervix or posterior lower uterine segment measuring 8.1 x 6.4 x 6.2 cm. This may reflect an additional fibroid with internal degeneration, however this was not clearly visualized on ultrasound dated 2015 and is highly suspicious for a cervical mass as significant enlargement of benign fibroids is not expected following menopause. This mass appears to closely abut and compress or perhaps even directly involve the adjacent rectum.   4.  No evidence of lymphadenopathy in the pelvis.   01/12/2022 PET scan   1. Hypermetabolic heterogeneous mass centered on the cervix/lower uterine segment is most consistent with primary gynecologic neoplasm. 2. Multiloculated complex bilateral adnexal cystic lesions with hypermetabolic soft tissue component likely reflect metastatic lesions. 3. No hypermetabolic abdominopelvic adenopathy.  4. No evidence of hypermetabolic metastatic disease in the chest or abdomen.   01/18/2022 Pathology Results   FINAL MICROSCOPIC DIAGNOSIS:   A. CERVICAL MASS, BIOPSIES:  - Poorly differentiated carcinoma with necrosis, consistent with high-grade serous carcinoma, see comment   B. CERVICAL MASS, EXCISION:  - Poorly differentiated carcinoma with necrosis, consistent with high-grade serous carcinoma see comment  - Edges of resected tissue fragments are positive for carcinoma   COMMENT:   A and B.    The tumor cells are positive for PAX8, ER and p16 immunostains.  Immunostain for p53 shows significant overexpression. Immunostain for CK5/6 shows weak focal labeling while immunostain for  p63 is negative.  The immunoprofile is consistent with above interpretation.  The carcinoma is likely endometrial or ovarian origin with secondary involvement of the cervix.     01/23/2022 Initial Diagnosis   Uterine cancer (HCC)   01/23/2022 Cancer Staging   Staging form: Corpus Uteri - Carcinoma and Carcinosarcoma, AJCC 8th Edition - Clinical stage from 01/23/2022: FIGO Stage IIIA (cT3a, cN0, cM0) - Signed by Artis Delay, MD on 01/23/2022 Stage prefix: Initial diagnosis   01/31/2022 - 01/31/2022 Chemotherapy   Patient is on Treatment Plan : UTERINE ENDOMETRIAL Dostarlimab-gxly (500 mg) + Carboplatin (AUC 5) + Paclitaxel (175 mg/m2) q21d x 6 cycles / Dostarlimab-gxly (1000 mg) q42d x 6 cycles      01/31/2022 -  Chemotherapy   Patient is on Treatment Plan : UTERINE ENDOMETRIAL Dostarlimab-gxly (500 mg) + Carboplatin (AUC 5) + Paclitaxel (175 mg/m2) q21d x 6 cycles / Dostarlimab-gxly (1000 mg) q42d x 6 cycles      04/14/2022 PET scan   1. Significant interval positive response to therapy. Mildly hypermetabolic solid 5.5 x 2.8 cm posterior uterine cervix mass and mildly hypermetabolic bilateral adnexal mixed cystic and solid masses are all significantly decreased in size and metabolism. 2. No new or progressive hypermetabolic metastatic disease. 3. Low level FDG uptake associated with healing subacute sclerotic nondisplaced anterior left third, fourth and fifth rib fractures without associated discrete osseous lesions, correlate for history of interval injury in this location. 4. Horseshoe kidney.   05/03/2022 Surgery   Robotic-assisted laparoscopic total hysterectomy with bilateral salpingo-oophorectomy, tumor debulking including infracolic omentectomy, mini-lap, cystoscopy, and vaginal laceration repair    Findings:  On speculum exam, normal-appearing cervix that is flush with the vaginal apex.  No visible cervical lesion.  On EUA, moderately mobile, enlarged uterus with mobile fullness appreciated in the cul-de-sac.  On intra-abdominal entry, normal upper abdominal survey.  Some adhesions of the omentum to the anterior abdominal wall below the umbilicus were noted.  Normal-appearing omentum, small and large bowel.  No ascites.  Right ovary minimally enlarged but overall normal in appearance.  Left ovary replaced by a 6 cm cystic mass, smooth.  Normal-appearing bilateral fallopian tubes.  Bladder was somewhat adherent anteriorly to the cervix.  Uterus approximately 8 cm and deviated to the left given a 10 cm anterior and right-sided fibroid.  The cervix itself did not appear enlarged, either on intra-abdominal phonation or speculum exam.  Posteriorly, a white ridge of somewhat indurated tissue was noted underneath the cervix and just above the rectum.  On palpation, this ridge could be appreciated only on rectovaginal exam.  It was separate and free from the cervix itself.  It was difficult to distinguish whether this was treated tumor or active tumor although its detached location from the cervix and uterus was noted.  Given my conversation with the patient prior to  surgery and that she would not want an ostomy (even temporarily), there was no way to resect this area out without significant risk of damage to the rectum.  Given its location, even in the setting of a bowel prep (which she had not had), she would likely require at least a protective diverting ostomy. Cystoscopy, bladder dome noted to be intact and good efflux seen from bilateral ureteral orifices.   05/03/2022 Pathology Results   A. UTERUS, CERVIX, BILATERAL TUBES AND OVARIES, RESECTION:  - Cervix      Nabothian cysts      Negative for malignancy  - Endometrium      Inactive endometrium      Two benign endometrial polyps      Negative for  malignancy  - Myometrium      Leiomyoma      Negative for malignancy  - Right ovary      Hemorrhagic cyst with fibrosis, hemosiderin and focal calcification      Negative for malignancy  - Left ovary      Hemorrhagic cyst with fibrosis, hemosiderin and focal calcification      Negative for malignancy  - Bilateral fallopian tubes      Unremarkable      Negative for malignancy  - See oncology table   B. OMENTUM, EXCISION:  - Benign omental adipose tissue.  - Negative for malignancy.     Genetic Testing   Negative genetic testing. No pathogenic variants identified on the [facility] CancerNext-Expanded+RNA panel. VUS in SDHA called [id number] identified. The report date is 05/03/2022.  The CancerNext-Expanded + RNAinsight gene panel offered by [employer] and includes sequencing and rearrangement analysis for the following 77 genes: IP, ALK, APC*, ATM*, AXIN2, BAP1, BARD1, BLM, BMPR1A, BRCA1*, BRCA2*, BRIP1*, CDC73, CDH1*,CDK4, CDKN1B, CDKN2A, CHEK2*, CTNNA1, DICER1, FANCC, FH, FLCN, GALNT12, KIF1B, LZTR1, MAX, MEN1, MET, MLH1*, MSH2*, MSH3, MSH6*, MUTYH*, NBN, NF1*, NF2, NTHL1, PALB2*, PHOX2B, PMS2*, POT1, PRKAR1A, PTCH1, PTEN*, RAD51C*, RAD51D*,RB1, RECQL, RET, SDHA, SDHAF2, SDHB, SDHC, SDHD, SMAD4, SMARCA4, SMARCB1, SMARCE1, STK11, SUFU, TMEM127, TP53*,TSC1, TSC2, VHL and XRCC2 (sequencing and deletion/duplication); EGFR, EGLN1, HOXB13, KIT, MITF, PDGFRA, POLD1 and POLE (sequencing only); EPCAM and GREM1 (deletion/duplication only).   08/14/2022 Imaging   1. Prior hysterectomy without new suspicious enhancing soft tissue nodularity along the vaginal cuff. 2. Decreased size of the low-density focus along the left external iliac vessels, nonspecific. Interval decrease in size somewhat reassuring, and this is favored to reflect a small lymphocele or loculated fluid. However in the context of ongoing chemotherapy the interval decrease in size may reflect treatment response and as such while not  favored metastatic disease can not be excluded. Continued attention on follow-up imaging is suggested. 3. No evidence of new or progressive disease in the abdomen or pelvis. 4. Diffuse hepatic steatosis.   11/02/2022 Imaging   CT ABDOMEN PELVIS W CONTRAST  Result Date: 11/02/2022 CLINICAL DATA:  Pelvic pain. Uterine carcinoma. Evaluate for recurrence. * Tracking Code: BO * EXAM: CT ABDOMEN AND PELVIS WITH CONTRAST TECHNIQUE: Multidetector CT imaging of the abdomen and pelvis was performed using the standard protocol following bolus administration of intravenous contrast. RADIATION DOSE REDUCTION: This exam was performed according to the departmental dose-optimization program which includes automated exposure control, adjustment of the mA and/or kV according to patient size and/or use of iterative reconstruction technique. CONTRAST:  OMNIPAQUE IOHEXOL 300 MG/ML  SOLN COMPARISON:  08/11/2022 FINDINGS: Lower Chest: No acute findings. Hepatobiliary: No hepatic masses identified. Moderate diffuse hepatic steatosis. Gallbladder  is unremarkable. No evidence of biliary ductal dilatation. Pancreas:  No mass or inflammatory changes. Spleen: Within normal limits in size and appearance. 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 No suspicious masses identified. No evidence of ureteral calculi or hydronephrosis. Unremarkable unopacified urinary bladder. Stomach/Bowel: No evidence of obstruction, inflammatory process or abnormal fluid collections. Normal appendix visualized. Vascular/Lymphatic: No pathologically enlarged lymph nodes. No acute vascular findings. Reproductive: Prior hysterectomy noted. No evidence of pelvic mass or free fluid. No evidence of peritoneal or omental soft tissue nodularity. Other:  None. Musculoskeletal:  No suspicious bone lesions identified. IMPRESSION: No acute findings. No evidence of recurrent or metastatic carcinoma within the abdomen or pelvis. Hepatic steatosis. Horseshoe  kidney. Electronically Signed   By: Danae Orleans M.D.   On: 11/02/2022 15:24      12/22/2022 Procedure   Successful removal of implanted Port-A-Cath.    02/20/2023 Imaging   CT ABDOMEN PELVIS W CONTRAST  Result Date: 02/26/2023 CLINICAL DATA:  Endometrial cancer restaging, abdominal and pelvic pain * Tracking Code: BO * EXAM: CT ABDOMEN AND PELVIS WITH CONTRAST TECHNIQUE: Multidetector CT imaging of the abdomen and pelvis was performed using the standard protocol following bolus administration of intravenous contrast. RADIATION DOSE REDUCTION: This exam was performed according to the departmental dose-optimization program which includes automated exposure control, adjustment of the mA and/or kV according to patient size and/or use of iterative reconstruction technique. CONTRAST:  OMNIPAQUE IOHEXOL 300 MG/ML  SOLN COMPARISON:  11/01/2022 FINDINGS: Lower chest: No acute abnormality. Hepatobiliary: No solid liver abnormality is seen. Severe hepatic steatosis. Hepatomegaly, maximum coronal span 20.6 cm. No gallstones, gallbladder wall thickening, or biliary dilatation. Pancreas: Unchanged atrophic, calcified appearance of the pancreatic tail. No pancreatic ductal dilatation or surrounding inflammatory changes. Spleen: Normal in size without significant abnormality. Adrenals/Urinary Tract: Adrenal glands are unremarkable. Horseshoe kidney. No calculi or hydronephrosis. Bladder is unremarkable. Stomach/Bowel: Stomach is within normal limits. Appendix appears normal. No evidence of bowel wall thickening, distention, or inflammatory changes. Vascular/Lymphatic: Aortic atherosclerosis. No enlarged abdominal or pelvic lymph nodes. Reproductive: Status post hysterectomy. Other: No abdominal wall hernia or abnormality. No ascites. Musculoskeletal: No acute or significant osseous findings. IMPRESSION: 1. No acute CT findings of the abdomen or pelvis to explain abdominal pain. 2. Status post hysterectomy. No evidence of  lymphadenopathy or metastatic disease in the abdomen or pelvis. 3. Severe hepatic steatosis and hepatomegaly. 4. Horseshoe kidney. Aortic Atherosclerosis (ICD10-I70.0). Electronically Signed   By: Jearld Lesch M.D.   On: 02/26/2023 13:54        PHYSICAL EXAMINATION: ECOG PERFORMANCE STATUS: 1 - Symptomatic but completely ambulatory  Vitals:   05/01/23 0845  BP: (!) 185/105  Pulse: 84  Resp: 10  Temp: 98 F (36.7 C)  SpO2: 100%   Filed Weights   05/01/23 0845  Weight: 135 lb 12.8 oz (61.6 kg)    GENERAL:alert, no distress and comfortable   LABORATORY DATA:  I have reviewed the data as listed    Component Value Date/Time   NA 139 05/01/2023 0823   K 3.6 05/01/2023 0823   CL 110 05/01/2023 0823   CO2 21 (L) 05/01/2023 0823   GLUCOSE 131 (H) 05/01/2023 0823   BUN 13 05/01/2023 0823   CREATININE 0.74 05/01/2023 0823   CALCIUM 9.1 05/01/2023 0823   PROT 7.1 05/01/2023 0823   ALBUMIN 4.3 05/01/2023 0823   AST 25 05/01/2023 0823   ALT 31 05/01/2023 0823   ALKPHOS 71 05/01/2023 0823   BILITOT 0.4 05/01/2023 1610  GFRNONAA >60 05/01/2023 0823    No results found for: "SPEP", "UPEP"  Lab Results  Component Value Date   WBC 6.4 05/01/2023   NEUTROABS 3.0 05/01/2023   HGB 12.6 05/01/2023   HCT 36.9 05/01/2023   MCV 74.1 (L) 05/01/2023   PLT 199 05/01/2023      Chemistry      Component Value Date/Time   NA 139 05/01/2023 0823   K 3.6 05/01/2023 0823   CL 110 05/01/2023 0823   CO2 21 (L) 05/01/2023 0823   BUN 13 05/01/2023 0823   CREATININE 0.74 05/01/2023 0823      Component Value Date/Time   CALCIUM 9.1 05/01/2023 0823   ALKPHOS 71 05/01/2023 0823   AST 25 05/01/2023 0823   ALT 31 05/01/2023 0823   BILITOT 0.4 05/01/2023 2956

## 2023-05-01 NOTE — Assessment & Plan Note (Signed)
Her last CT imaging showed no evidence of disease She will continue maintenance immunotherapy I plan to repeat imaging study again next year

## 2023-05-01 NOTE — Patient Instructions (Signed)
 [facility] - A DEPT OF [facility]  Discharge Instructions: Thank you for choosing [facility] to provide your oncology and hematology care.   If you have a lab appointment with the Cancer Center, please go directly to the Cancer Center and check in at the registration area.   Wear comfortable clothing and clothing appropriate for easy access to any Portacath or PICC line.   We strive to give you quality time with your provider. You may need to reschedule your appointment if you arrive late (15 or more minutes).  Arriving late affects you and other patients whose appointments are after yours.  Also, if you miss three or more appointments without notifying the office, you may be dismissed from the clinic at the provider's discretion.      For prescription refill requests, have your pharmacy contact our office and allow 72 hours for refills to be completed.    Today you received the following chemotherapy and/or immunotherapy agents Jemperli.      To help prevent nausea and vomiting after your treatment, we encourage you to take your nausea medication as directed.  BELOW ARE SYMPTOMS THAT SHOULD BE REPORTED IMMEDIATELY: *FEVER GREATER THAN 100.4 F (38 C) OR HIGHER *CHILLS OR SWEATING *NAUSEA AND VOMITING THAT IS NOT CONTROLLED WITH YOUR NAUSEA MEDICATION *UNUSUAL SHORTNESS OF BREATH *UNUSUAL BRUISING OR BLEEDING *URINARY PROBLEMS (pain or burning when urinating, or frequent urination) *BOWEL PROBLEMS (unusual diarrhea, constipation, pain near the anus) TENDERNESS IN MOUTH AND THROAT WITH OR WITHOUT PRESENCE OF ULCERS (sore throat, sores in mouth, or a toothache) UNUSUAL RASH, SWELLING OR PAIN  UNUSUAL VAGINAL DISCHARGE OR ITCHING   Items with * indicate a potential emergency and should be followed up as soon as possible or go to the Emergency Department if any problems should occur.  Please show the CHEMOTHERAPY ALERT CARD or IMMUNOTHERAPY  ALERT CARD at check-in to the Emergency Department and triage nurse.  Should you have questions after your visit or need to cancel or reschedule your appointment, please contact [facility] - A DEPT OF [facility]  Dept: [phone]  and follow the prompts.  Office hours are 8:00 a.m. to 4:30 p.m. Monday - Friday. Please note that voicemails left after 4:00 p.m. may not be returned until the following business day.  We are closed weekends and major holidays. You have access to a nurse at all times for urgent questions. Please call the main number to the clinic Dept: [phone] and follow the prompts.   For any non-urgent questions, you may also contact your provider using MyChart. We now offer e-Visits for anyone 63 and older to request care online for non-urgent symptoms. For details visit [email or web address].   Also download the MyChart app! Go to the app store, search "MyChart", open the app, select , and log in with your MyChart username and password.

## 2023-05-02 LAB — T4: T4, Total: 9 ug/dL (ref 4.5–12.0)

## 2023-05-03 ENCOUNTER — Encounter: Payer: Self-pay | Admitting: Hematology and Oncology

## 2023-05-22 ENCOUNTER — Telehealth: Payer: Self-pay

## 2023-05-22 ENCOUNTER — Other Ambulatory Visit: Payer: Self-pay | Admitting: Hematology and Oncology

## 2023-05-22 NOTE — Telephone Encounter (Signed)
The decision is up to her Let me know before I cancel her appt

## 2023-05-22 NOTE — Telephone Encounter (Signed)
Called her and given below message. Canceled all 1/2 appts. She has appt with PCP on 1/2.  She would like a lab appt and see Dr. Berneda Rose in January to discuss when to resume treatment please.

## 2023-05-22 NOTE — Telephone Encounter (Signed)
I will send LOS 

## 2023-05-22 NOTE — Telephone Encounter (Signed)
She called and left a message. She wants to take break from treatment and cancel infusion appt on 1/2. She is asking if she should keep appt for lab and MD on 1/2?

## 2023-05-23 ENCOUNTER — Telehealth: Payer: Self-pay | Admitting: Hematology and Oncology

## 2023-05-23 NOTE — Telephone Encounter (Signed)
 Left patient a vm regarding upcoming appointment

## 2023-05-28 ENCOUNTER — Other Ambulatory Visit: Payer: Self-pay

## 2023-05-28 MED ORDER — SYNTHROID 75 MCG PO TABS: 75.0000 ug | ORAL_TABLET | Freq: Every day | ORAL | 1 refills | Status: AC

## 2023-06-18 ENCOUNTER — Telehealth: Payer: Self-pay | Admitting: *Deleted

## 2023-06-18 NOTE — Telephone Encounter (Addendum)
Pt called wanting to cancel all appt with Dr. Bertis Ruddy because she no longer wants to take immunotherapy. Advised pt per note she wanted appt with Dr.Gorsuch to speak on when to resume treatment. Pt stated that it was a mix up and not what she wants to do. She wants to cancel all appt. Appt have been canceled. Marland Kitchen

## 2023-06-21 ENCOUNTER — Ambulatory Visit: Payer: Medicaid Other | Admitting: Hematology and Oncology

## 2023-06-21 ENCOUNTER — Ambulatory Visit: Payer: Medicaid Other

## 2023-06-21 ENCOUNTER — Other Ambulatory Visit: Payer: Medicaid Other

## 2023-06-28 ENCOUNTER — Ambulatory Visit: Payer: Medicaid Other | Admitting: Hematology and Oncology

## 2023-07-02 ENCOUNTER — Other Ambulatory Visit: Payer: Self-pay | Admitting: Hematology and Oncology

## 2023-07-05 ENCOUNTER — Telehealth: Payer: Self-pay | Admitting: Licensed Clinical Social Worker

## 2023-07-05 NOTE — Telephone Encounter (Signed)
TC from pt inquiring about what to do regarding billing and payments. Accounts were with [employer] [facility] and pt states she was told by someone to move them back to [facility] for a lower payment. The payment now is higher. Pt did receive forms from billing asking for information from her and CSW encouraged to complete to see if she can qualify for hardship settlement.    No other questions at this time.   Aiva Miskell E Vernell Back, LCSW

## 2023-07-18 ENCOUNTER — Telehealth: Payer: Self-pay | Admitting: *Deleted

## 2023-07-18 NOTE — Telephone Encounter (Signed)
[employer] [place] form completed to provider for review and signature.  Today connected with Dashae Wilcher, [phone]) to confirm type of leave or is she returning to work.  Noted no further appointments with Med/Onc or further treatments per 06/18/2023 telephone encounter.Marland Kitchen "Need to continue leave from work.  Currently taking a break from treatment.  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  Seeing a dermatologist and established care with a PCP as I was advised.  Scheduled to see Gyn/Onc on 08/10/2023.  Lab work was done when I saw PCP is why I did not need the appointments in January.  If Med/Onc feels I need to be seen, I will come in." [facility] for clarifying need for leave.reason no current appointments and this nurse will notify providers to determine scheduling needs however connect with office about toxicities and when ready to resume treatments.Marland Kitchen

## 2023-07-19 ENCOUNTER — Telehealth: Payer: Self-pay | Admitting: Oncology

## 2023-07-19 NOTE — Telephone Encounter (Signed)
Called Tiffany [name] regarding her [employer] disability paperwork.  Advised her that Dr. Bertis Ruddy cannot sign the paperwork since she had canceled her follow up appointment. [name] said she had canceled her appointment with Dr. Bertis Ruddy because she decided to put her immunotherapy on hold.  She has seen her primary care doctor and dermatology for her finger fungus.  She would like to schedule a follow up with Dr. Bertis Ruddy if possible.  Also discussed that she has an appointment with Dr. Pricilla Holm on 2/21 and she could ask to have the paperwork signed then.  [name] said she would like to schedule an appointment soon with Dr. Bertis Ruddy.

## 2023-07-19 NOTE — Telephone Encounter (Signed)
I can see her after she sees Dr. Pricilla Holm, only if she changes her mind and wants treatment

## 2023-07-20 ENCOUNTER — Encounter: Payer: Self-pay | Admitting: Oncology

## 2023-07-20 NOTE — Telephone Encounter (Signed)
Called Tiffany [name] and let her know we will fax a letter to [facility].  She verbalized understanding and agreement.

## 2023-07-20 NOTE — Telephone Encounter (Signed)
I would be grateful if you could type a letter on my behalf

## 2023-07-20 NOTE — Telephone Encounter (Signed)
Form not signed.  Patient not seen since 05/01/2023 and has cancelled appointments and further immunotherapy.  Faxed providers letter explaining above to the [facility].  Request for records from 01/18/2023 to present faxed to (SW) H.I.M at [phone].  Navigator reports having  spoke with [employer].  No further instructions received, actions performed or required by this nurse.

## 2023-07-20 NOTE — Telephone Encounter (Signed)
Called Tiffany [name] and advised her of message from Dr. Bertis Ruddy. Discussed that she could also ask her PCP to sign her disability paperwork.  [name] is going to do that.  She also asked if Dr. Bertis Ruddy could send the [name] a letter or make a note on the paperwork that she did receive treatment from August to November but hasn't been seen since then due to canceled appointments and treatment being put on hold.  Advised I will ask and get back to her.

## 2023-07-23 ENCOUNTER — Telehealth: Payer: Self-pay

## 2023-07-23 NOTE — Telephone Encounter (Signed)
Returned her. She asking about medical records if it has been sent to [facility]. Told her that I can see a note in the chart on 1/31 with a request faxed to HIM. Given FMLA/ Disability department phone #.

## 2023-07-24 NOTE — Telephone Encounter (Signed)
 Called her back. She is asking if Dr. Lonn will sign the attending physician statement up until her last appt on 11/12. She will get PCP to sign after 11/12. Told her that I will get [employer]/ disability department to work on paper work. She verbalized understanding.

## 2023-07-26 NOTE — Telephone Encounter (Signed)
 Staff Message received regarding attending physician statement for [employer] from collaborative 07/25/2023. Vicci Erminio ORN, RN sent to Tsosie Schwab, RN Hi! Dr. Lonn will sign the attending physician statement up until last appt on 11/12. Summerlynn will have PCP to sign after that date. Thanks/  Amended disability form upon return to office to reflect above.  Received signed authorization today.  Successfully returned to [employer] with (SW) [facility] letter of inability to comply with request for records.  Message left for Marielis Wold to call this nurse about records release.  Attending Physician Request faxed to (SW) H.I.M.

## 2023-08-02 ENCOUNTER — Telehealth: Payer: Self-pay | Admitting: *Deleted

## 2023-08-02 NOTE — Telephone Encounter (Signed)
Checked [facility].I.M. releases for status check of records requested by [employer] which reads "In Progress".  Connected with Audyn Dimercurio, [phone]) to advise she does not need to come in today to sign another ROI.  She called after speaking with [employer] about H.I.M. letter received.

## 2023-08-07 ENCOUNTER — Telehealth: Payer: Self-pay | Admitting: *Deleted

## 2023-08-07 NOTE — Telephone Encounter (Signed)
"  Tiffany [name], [phone]) calling to check status of my medical record release." Per H.I.M. releases a letter dated 08/02/2023 was sent to [facility].  Unable to comply with new authorization and helpful tip sheet for requesting records.   Conference call to [employer].  Patient confirmed reasons records not released due to ROI and Health System legal name.  Representative resending request to [phone].  This nurse obtained ROI sending to (SW) H.I.M.

## 2023-08-08 ENCOUNTER — Encounter: Payer: Self-pay | Admitting: Gynecologic Oncology

## 2023-08-10 ENCOUNTER — Encounter: Payer: Self-pay | Admitting: Gynecologic Oncology

## 2023-08-10 ENCOUNTER — Inpatient Hospital Stay: Payer: Medicaid Other | Attending: Gynecologic Oncology | Admitting: Gynecologic Oncology

## 2023-08-10 VITALS — BP 148/88 | HR 95 | Temp 98.3°F | Resp 19 | Wt 136.0 lb

## 2023-08-10 DIAGNOSIS — Z8542 Personal history of malignant neoplasm of other parts of uterus: Secondary | ICD-10-CM | POA: Insufficient documentation

## 2023-08-10 DIAGNOSIS — Z90710 Acquired absence of both cervix and uterus: Secondary | ICD-10-CM | POA: Insufficient documentation

## 2023-08-10 DIAGNOSIS — N952 Postmenopausal atrophic vaginitis: Secondary | ICD-10-CM

## 2023-08-10 DIAGNOSIS — Z08 Encounter for follow-up examination after completed treatment for malignant neoplasm: Secondary | ICD-10-CM | POA: Insufficient documentation

## 2023-08-10 DIAGNOSIS — Z90722 Acquired absence of ovaries, bilateral: Secondary | ICD-10-CM | POA: Diagnosis not present

## 2023-08-10 DIAGNOSIS — D259 Leiomyoma of uterus, unspecified: Secondary | ICD-10-CM

## 2023-08-10 DIAGNOSIS — Z9221 Personal history of antineoplastic chemotherapy: Secondary | ICD-10-CM | POA: Insufficient documentation

## 2023-08-10 DIAGNOSIS — C579 Malignant neoplasm of female genital organ, unspecified: Secondary | ICD-10-CM

## 2023-08-10 DIAGNOSIS — Z9079 Acquired absence of other genital organ(s): Secondary | ICD-10-CM | POA: Insufficient documentation

## 2023-08-10 NOTE — Progress Notes (Signed)
 Gynecologic Oncology Return Clinic Visit  08/10/23  Reason for Visit: surveillance  Treatment History: Oncology History Overview Note  HER2 negative, Neg Genetics MMR Normal P53 positive   Uterine cancer (HCC)  12/03/2021 Initial Diagnosis   Patient reports several month history of intermittent pelvic pain that she describes as discomfort or sometimes cramping, that has worsened with time.  She notes that this is tolerable.  She began having postmenopausal bleeding started on 6/17 with a few spots of blood.     12/29/2021 Imaging   1. Bilateral large multiloculated cystic lesions of the ovaries, concerning for ovarian malignancy. Recommend gynecologic consultation and pelvic ultrasound and/or pelvic MRI with contrast for further evaluation. 2. Large heterogeneous mass of the posterior pelvis likely arising from the lower uterus, possibly a fibroid, although malignancy is also a concern. Recommend attention on pelvic ultrasound or MRI as above. 3. Additional mass arising from the right uterine fundus which correlates with fibroid described on prior 2015 ultrasound. 4. Nonspecific small ground-glass nodule of the right lower lobe measuring 6 mm. Recommend follow-up chest CT in 6 months for further evaluation. 5. Aortic Atherosclerosis (ICD10-I70.0).   12/30/2021 Pathology Results   A. CERVIX, BIOPSY:  -  Scantly cellular specimen with predominantly blood, fibrin and acute inflammatory infiltrate but with scattered highly atypical cells highly suspicious for malignancy.   Note: A p16 shows strong positivity in these scattered cells; however, while p53 is mildly increased definitive strong overexpression or under expression is not identified.  These findings support the suspicion for malignancy; however, are insufficient for a definitive diagnosis.     01/03/2022 Imaging   MRI pelvis  1. Large mixed solid and cystic lesions of the bilateral ovaries, measuring 8.4 x 7.0 x 5.4 cm on the right  and 10.3 x 8.7 x 8.1 cm on the left, highly concerning for primary ovarian malignancy and contralateral ovarian metastasis.   2. Homogeneously enhancing mass arising from the right aspect of the uterine body and fundus measuring 6.0 x 5.5 x 4.3 cm, consistent with a uterine fibroid and similar to prior ultrasound dated 2015.   3. An additional, much more heterogeneous mass which appears to arise from the cervix or posterior lower uterine segment measuring 8.1 x 6.4 x 6.2 cm. This may reflect an additional fibroid with internal degeneration, however this was not clearly visualized on ultrasound dated 2015 and is highly suspicious for a cervical mass as significant enlargement of benign fibroids is not expected following menopause. This mass appears to closely abut and compress or perhaps even directly involve the adjacent rectum.   4.  No evidence of lymphadenopathy in the pelvis.   01/12/2022 PET scan   1. Hypermetabolic heterogeneous mass centered on the cervix/lower uterine segment is most consistent with primary gynecologic neoplasm. 2. Multiloculated complex bilateral adnexal cystic lesions with hypermetabolic soft tissue component likely reflect metastatic lesions. 3. No hypermetabolic abdominopelvic adenopathy. 4. No evidence of hypermetabolic metastatic disease in the chest or abdomen.   01/18/2022 Pathology Results   FINAL MICROSCOPIC DIAGNOSIS:   A. CERVICAL MASS, BIOPSIES:  - Poorly differentiated carcinoma with necrosis, consistent with high-grade serous carcinoma, see comment   B. CERVICAL MASS, EXCISION:  - Poorly differentiated carcinoma with necrosis, consistent with high-grade serous carcinoma see comment  - Edges of resected tissue fragments are positive for carcinoma   COMMENT:   A and B.   The tumor cells are positive for PAX8, ER and p16 immunostains.  Immunostain for p53 shows significant overexpression. Immunostain  for CK5/6 shows weak focal labeling while immunostain for   p63 is negative.  The immunoprofile is consistent with above interpretation.  The carcinoma is likely endometrial or ovarian origin with secondary involvement of the cervix.     01/23/2022 Initial Diagnosis   Uterine cancer (HCC)   01/23/2022 Cancer Staging   Staging form: Corpus Uteri - Carcinoma and Carcinosarcoma, AJCC 8th Edition - Clinical stage from 01/23/2022: FIGO Stage IIIA (cT3a, cN0, cM0) - Signed by Artis Delay, MD on 01/23/2022 Stage prefix: Initial diagnosis   01/31/2022 - 01/31/2022 Chemotherapy   Patient is on Treatment Plan : UTERINE ENDOMETRIAL Dostarlimab-gxly (500 mg) + Carboplatin (AUC 5) + Paclitaxel (175 mg/m2) q21d x 6 cycles / Dostarlimab-gxly (1000 mg) q42d x 6 cycles      01/31/2022 - 05/01/2023 Chemotherapy   Patient is on Treatment Plan : UTERINE ENDOMETRIAL Dostarlimab-gxly (500 mg) + Carboplatin (AUC 5) + Paclitaxel (175 mg/m2) q21d x 6 cycles / Dostarlimab-gxly (1000 mg) q42d x 6 cycles      04/14/2022 PET scan   1. Significant interval positive response to therapy. Mildly hypermetabolic solid 5.5 x 2.8 cm posterior uterine cervix mass and mildly hypermetabolic bilateral adnexal mixed cystic and solid masses are all significantly decreased in size and metabolism. 2. No new or progressive hypermetabolic metastatic disease. 3. Low level FDG uptake associated with healing subacute sclerotic nondisplaced anterior left third, fourth and fifth rib fractures without associated discrete osseous lesions, correlate for history of interval injury in this location. 4. Horseshoe kidney.   05/03/2022 Surgery   Robotic-assisted laparoscopic total hysterectomy with bilateral salpingo-oophorectomy, tumor debulking including infracolic omentectomy, mini-lap, cystoscopy, and vaginal laceration repair   Findings:  On speculum exam, normal-appearing cervix that is flush with the vaginal apex.  No visible cervical lesion.  On EUA, moderately mobile, enlarged uterus with mobile fullness  appreciated in the cul-de-sac.  On intra-abdominal entry, normal upper abdominal survey.  Some adhesions of the omentum to the anterior abdominal wall below the umbilicus were noted.  Normal-appearing omentum, small and large bowel.  No ascites.  Right ovary minimally enlarged but overall normal in appearance.  Left ovary replaced by a 6 cm cystic mass, smooth.  Normal-appearing bilateral fallopian tubes.  Bladder was somewhat adherent anteriorly to the cervix.  Uterus approximately 8 cm and deviated to the left given a 10 cm anterior and right-sided fibroid.  The cervix itself did not appear enlarged, either on intra-abdominal phonation or speculum exam.  Posteriorly, a white ridge of somewhat indurated tissue was noted underneath the cervix and just above the rectum.  On palpation, this ridge could be appreciated only on rectovaginal exam.  It was separate and free from the cervix itself.  It was difficult to distinguish whether this was treated tumor or active tumor although its detached location from the cervix and uterus was noted.  Given my conversation with the patient prior to surgery and that she would not want an ostomy (even temporarily), there was no way to resect this area out without significant risk of damage to the rectum.  Given its location, even in the setting of a bowel prep (which she had not had), she would likely require at least a protective diverting ostomy. Cystoscopy, bladder dome noted to be intact and good efflux seen from bilateral ureteral orifices.   05/03/2022 Pathology Results   A. UTERUS, CERVIX, BILATERAL TUBES AND OVARIES, RESECTION:  - Cervix      Nabothian cysts      Negative for  malignancy  - Endometrium      Inactive endometrium      Two benign endometrial polyps      Negative for malignancy  - Myometrium      Leiomyoma      Negative for malignancy  - Right ovary      Hemorrhagic cyst with fibrosis, hemosiderin and focal calcification      Negative for  malignancy  - Left ovary      Hemorrhagic cyst with fibrosis, hemosiderin and focal calcification      Negative for malignancy  - Bilateral fallopian tubes      Unremarkable      Negative for malignancy  - See oncology table   B. OMENTUM, EXCISION:  - Benign omental adipose tissue.  - Negative for malignancy.     Genetic Testing   Negative genetic testing. No pathogenic variants identified on the [facility] CancerNext-Expanded+RNA panel. VUS in SDHA called p.[id number] identified. The report date is 05/03/2022.  The CancerNext-Expanded + RNAinsight gene panel offered by [employer] and includes sequencing and rearrangement analysis for the following 77 genes: IP, ALK, APC*, ATM*, AXIN2, BAP1, BARD1, BLM, BMPR1A, BRCA1*, BRCA2*, BRIP1*, CDC73, CDH1*,CDK4, CDKN1B, CDKN2A, CHEK2*, CTNNA1, DICER1, FANCC, FH, FLCN, GALNT12, KIF1B, LZTR1, MAX, MEN1, MET, MLH1*, MSH2*, MSH3, MSH6*, MUTYH*, NBN, NF1*, NF2, NTHL1, PALB2*, PHOX2B, PMS2*, POT1, PRKAR1A, PTCH1, PTEN*, RAD51C*, RAD51D*,RB1, RECQL, RET, SDHA, SDHAF2, SDHB, SDHC, SDHD, SMAD4, SMARCA4, SMARCB1, SMARCE1, STK11, SUFU, TMEM127, TP53*,TSC1, TSC2, VHL and XRCC2 (sequencing and deletion/duplication); EGFR, EGLN1, HOXB13, KIT, MITF, PDGFRA, POLD1 and POLE (sequencing only); EPCAM and GREM1 (deletion/duplication only).   08/14/2022 Imaging   1. Prior hysterectomy without new suspicious enhancing soft tissue nodularity along the vaginal cuff. 2. Decreased size of the low-density focus along the left external iliac vessels, nonspecific. Interval decrease in size somewhat reassuring, and this is favored to reflect a small lymphocele or loculated fluid. However in the context of ongoing chemotherapy the interval decrease in size may reflect treatment response and as such while not favored metastatic disease can not be excluded. Continued attention on follow-up imaging is suggested. 3. No evidence of new or progressive disease in the abdomen or pelvis. 4. Diffuse  hepatic steatosis.   11/02/2022 Imaging   CT ABDOMEN PELVIS W CONTRAST  Result Date: 11/02/2022 CLINICAL DATA:  Pelvic pain. Uterine carcinoma. Evaluate for recurrence. * Tracking Code: BO * EXAM: CT ABDOMEN AND PELVIS WITH CONTRAST TECHNIQUE: Multidetector CT imaging of the abdomen and pelvis was performed using the standard protocol following bolus administration of intravenous contrast. RADIATION DOSE REDUCTION: This exam was performed according to the departmental dose-optimization program which includes automated exposure control, adjustment of the mA and/or kV according to patient size and/or use of iterative reconstruction technique. CONTRAST:  OMNIPAQUE IOHEXOL 300 MG/ML  SOLN COMPARISON:  08/11/2022 FINDINGS: Lower Chest: No acute findings. Hepatobiliary: No hepatic masses identified. Moderate diffuse hepatic steatosis. Gallbladder is unremarkable. No evidence of biliary ductal dilatation. Pancreas:  No mass or inflammatory changes. Spleen: Within normal limits in size and appearance. 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 No suspicious masses identified. No evidence of ureteral calculi or hydronephrosis. Unremarkable unopacified urinary bladder. Stomach/Bowel: No evidence of obstruction, inflammatory process or abnormal fluid collections. Normal appendix visualized. Vascular/Lymphatic: No pathologically enlarged lymph nodes. No acute vascular findings. Reproductive: Prior hysterectomy noted. No evidence of pelvic mass or free fluid. No evidence of peritoneal or omental soft tissue nodularity. Other:  None. Musculoskeletal:  No suspicious bone lesions identified. IMPRESSION: No acute  findings. No evidence of recurrent or metastatic carcinoma within the abdomen or pelvis. Hepatic steatosis. Horseshoe kidney. Electronically Signed   By: Danae Orleans M.D.   On: 11/02/2022 15:24      12/22/2022 Procedure   Successful removal of implanted Port-A-Cath.    02/20/2023 Imaging   CT ABDOMEN  PELVIS W CONTRAST  Result Date: 02/26/2023 CLINICAL DATA:  Endometrial cancer restaging, abdominal and pelvic pain * Tracking Code: BO * EXAM: CT ABDOMEN AND PELVIS WITH CONTRAST TECHNIQUE: Multidetector CT imaging of the abdomen and pelvis was performed using the standard protocol following bolus administration of intravenous contrast. RADIATION DOSE REDUCTION: This exam was performed according to the departmental dose-optimization program which includes automated exposure control, adjustment of the mA and/or kV according to patient size and/or use of iterative reconstruction technique. CONTRAST:  OMNIPAQUE IOHEXOL 300 MG/ML  SOLN COMPARISON:  11/01/2022 FINDINGS: Lower chest: No acute abnormality. Hepatobiliary: No solid liver abnormality is seen. Severe hepatic steatosis. Hepatomegaly, maximum coronal span 20.6 cm. No gallstones, gallbladder wall thickening, or biliary dilatation. Pancreas: Unchanged atrophic, calcified appearance of the pancreatic tail. No pancreatic ductal dilatation or surrounding inflammatory changes. Spleen: Normal in size without significant abnormality. Adrenals/Urinary Tract: Adrenal glands are unremarkable. Horseshoe kidney. No calculi or hydronephrosis. Bladder is unremarkable. Stomach/Bowel: Stomach is within normal limits. Appendix appears normal. No evidence of bowel wall thickening, distention, or inflammatory changes. Vascular/Lymphatic: Aortic atherosclerosis. No enlarged abdominal or pelvic lymph nodes. Reproductive: Status post hysterectomy. Other: No abdominal wall hernia or abnormality. No ascites. Musculoskeletal: No acute or significant osseous findings. IMPRESSION: 1. No acute CT findings of the abdomen or pelvis to explain abdominal pain. 2. Status post hysterectomy. No evidence of lymphadenopathy or metastatic disease in the abdomen or pelvis. 3. Severe hepatic steatosis and hepatomegaly. 4. Horseshoe kidney. Aortic Atherosclerosis (ICD10-I70.0). Electronically  Signed   By: Jearld Lesch M.D.   On: 02/26/2023 13:54        Interval History: Overall doing well.  Continues to struggle with nail fungal infection.  Was on treatment for this for about 2 months through her PCP, came off of it for 7-10 days, recently restarted.  Does not think she has noticed much difference in the appearance of her nails.  Has intermittent abdominal pain.  Notes some rectal pain/pressure if she gets constipated or abdomen is more full.  Also notes that the skin seems to be thinner, especially around her anus.  She will sometimes tear if she is constipated and has to push hard for a bowel movement.  She denies any vaginal bleeding.  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   oncologist-- dr Bertis Ruddy;   dx 06/ 2023   Multinodular thyroid    bilateral nodules ;  hx bx 07-31-2017 benign ;  last ultrasound in epic 01-08-2020   Neuropathy due to chemotherapeutic drug [facility])    Uterine cancer [facility]) 01/2022   gyn oncologist-- dr Wilmarie Sparlin/  oncologist--- dr Bertis Ruddy;  dx 01-23-2022,  started chemo 01-31-2022    Past Surgical History:  Procedure Laterality Date   CESAREAN SECTION     COLONOSCOPY  2016   dr Fayrene Fearing [facility])   CYSTOSCOPY N/A 05/03/2022   Procedure: CYSTOSCOPY;  Surgeon: Carver Fila, MD;  Location: [facility];  Service: Gynecology;  Laterality: N/A;   DILATION AND EVACUATION  08/17/2000   @[facility]  for missed ab   IR IMAGING GUIDED PORT INSERTION  01/25/2022   IR REMOVAL TUN ACCESS W/ PORT W/O FL MOD SED  12/22/2022   LESION REMOVAL N/A 01/18/2022   Procedure: CERVICAL BIOPSIES;  Surgeon: Carver Fila, MD;  Location: [facility] ORS;  Service: Gynecology;  Laterality: N/A;   ROBOTIC ASSISTED TOTAL HYSTERECTOMY WITH BILATERAL SALPINGO  OOPHERECTOMY  05/03/2022   @[facility]  by dr Pricilla Holm ;   with mini lap, omentectomy, tumor debulking    Family History  Problem Relation Age of Onset   Thyroid disease Mother    Breast cancer Mother        dx late 42s   Cancer Other        unk type, d. 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  Tobacco Use   Smoking status: Never   Smokeless tobacco: Never  Vaping Use   Vaping status: Never Used  Substance and Sexual Activity   Alcohol use: Never   Drug use: Never   Sexual activity: Not Currently  Other Topics Concern   Not on file  Social History Narrative   Not on file   Social Drivers of Health   Financial Resource Strain: Not on file  Food Insecurity: Low Risk  (06/20/2023)   Received from [facility]   Hunger Vital Sign    Worried About Running Out of Food in the Last Year: Never true    Ran Out of Food in the Last Year: Never true  Transportation Needs: No Transportation Needs (06/20/2023)   Received from [facility]    In the past 12 months, has lack of reliable transportation kept you from medical appointments, meetings, work or from getting things needed for daily living? : No  Physical Activity: Not on file  Stress: Not on file  Social Connections: Unknown (01/05/2022)   Received from [facility]   Social Network    Social Network: Not on file    Current Medications:  Current Outpatient Medications:    ascorbic acid (VITAMIN C) 250 MG CHEW, Chew 500 mg by mouth daily., Disp: , Rfl:    buPROPion (WELLBUTRIN XL) 150 MG 24 hr tablet, Take by mouth., Disp: ,  Rfl:    calcium carbonate (TUMS - DOSED IN MG ELEMENTAL CALCIUM) 500 MG chewable tablet, Chew 1 tablet by mouth daily., Disp: , Rfl:    Cholecalciferol (VITAMIN D3 PO), Take 1 tablet by mouth daily., Disp: , Rfl:    eszopiclone (LUNESTA) 1 MG TABS tablet, Take 1 tablet by mouth daily., Disp: , Rfl:    metFORMIN (GLUCOPHAGE-XR) 500 MG 24 hr tablet, Take 1 tablet by mouth twice daily. Increase to 2 tablets twice daily after 1 week. Take with food, Disp: , Rfl:    Multiple Vitamin (MULTIVITAMIN WITH MINERALS) TABS tablet, Take 1 tablet by mouth daily., Disp: , Rfl:  SYNTHROID 75 MCG tablet, Take 1 tablet (75 mcg) by mouth daily before breakfast., Disp: 30 tablet, Rfl: 1   terbinafine (LAMISIL) 250 MG tablet, Take 1 tablet by mouth daily., Disp: , Rfl:   Review of Systems: + Pelvic pain, joint pain, dizziness, headache, depression, confusion Denies appetite changes, fevers, chills, fatigue, unexplained weight changes. Denies hearing loss, neck lumps or masses, mouth sores, ringing in ears or voice changes. Denies cough or wheezing.  Denies shortness of breath. Denies chest pain or palpitations. Denies leg swelling. Denies abdominal distention, pain, blood in stools, constipation, diarrhea, nausea, vomiting, or early satiety. Denies pain with intercourse, dysuria, frequency, hematuria or incontinence. Denies hot flashes, vaginal bleeding or vaginal discharge.   Denies back pain or muscle pain/cramps. Denies itching, rash, or wounds. Denies numbness or seizures. Denies swollen lymph nodes or glands, denies easy bruising or bleeding. Denies anxiety or decreased concentration.  Physical Exam: BP (!) 161/83 (BP Location: Left Arm, Patient Position: Sitting) Comment: Notified RN  Pulse 95   Temp 98.3 F (36.8 C) (Oral)   Resp 19   Wt 136 lb (61.7 kg)   SpO2 99%   BMI 23.51 kg/m  General: Alert, oriented, no acute distress. HEENT: Normocephalic, atraumatic, sclera anicteric.  One shotty  mandibular lymph node palpated bilaterally. Chest: Unlabored breathing on room air.  Lungs clear to auscultation bilaterally, no wheezes or rhonchi. Cardiovascular: Regular rate and rhythm, no murmurs or rubs appreciated. Abdomen: soft, nontender.  Normoactive bowel sounds.  No masses or hepatosplenomegaly appreciated.  Well-healed incisions. Extremities: Grossly normal range of motion.  Warm, well perfused.  No edema bilaterally. Thinning and white discoloration of most of her nail beds on hands bilaterally. Lymphatics: No inguinal, clavicular neuropathy. GU: Normal appearing external genitalia without erythema, excoriation, or lesions.  Speculum exam with extra small speculum is somewhat tolerated by the patient, not as well-tolerated as prior exams with premedication.  I am able to insert the speculum what I suspect is half of the length of the vagina and open it minimally.  No obvious lesions noted, mucsoa normal in appearance although view limited.  On single digit bimanual exam, I am felt to reach approximately 4 cm, no nodularity or masses appreciated.  Some agglutination of the posterior introitus.    Laboratory & Radiologic Studies: None new  Assessment & Plan: Tiffany [name] is a 63 y.o. 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   The patient is doing very well.  She is NED on exam today.     Given side effects from immunotherapy, she ultimately decided to come off of treatment at the end of last year. Most recent CT scan is negative for recurrent disease.  Pelvic exam continues to be somewhat challenging although is better tolerated than previously (best tolerated with premedication).  CT imaging was scheduled for next month.  She is following with her PCP for onychomycosis.   We reviewed signs and  symptoms that would be concerning for disease recurrence.  I stressed the importance of calling if she develops any of these between visits.  I will plan to see her back for follow-up in 4 months.  22 minutes of total time was spent for this patient encounter, including preparation, face-to-face counseling with the patient and coordination of care, and documentation of the encounter.  Eugene Garnet, MD  Division of Gynecologic Oncology  Department of Obstetrics and Gynecology  [facility]

## 2023-08-10 NOTE — Patient Instructions (Signed)
 It was good to see you today.  I do not see or feel any evidence of cancer recurrence on your exam.  I will see you for follow-up in 3 months.  The office will get you scheduled for a CT of your chest, abdomen and pelvis in March.   As always, if you develop any new and concerning symptoms before your next visit, please call to see me sooner.

## 2023-08-27 ENCOUNTER — Encounter (HOSPITAL_BASED_OUTPATIENT_CLINIC_OR_DEPARTMENT_OTHER): Payer: Self-pay

## 2023-08-27 ENCOUNTER — Ambulatory Visit (HOSPITAL_BASED_OUTPATIENT_CLINIC_OR_DEPARTMENT_OTHER)
Admission: RE | Admit: 2023-08-27 | Discharge: 2023-08-27 | Disposition: A | Discharge status: Home / Self Care | Payer: Medicaid Other | Source: Ambulatory Visit | Attending: Gynecologic Oncology | Admitting: Gynecologic Oncology

## 2023-08-27 DIAGNOSIS — C579 Malignant neoplasm of female genital organ, unspecified: Secondary | ICD-10-CM | POA: Diagnosis present

## 2023-08-27 MED ORDER — IOHEXOL 300 MG/ML  SOLN
100.0000 mL | Freq: Once | INTRAMUSCULAR | Status: AC | PRN
  Administered 2023-08-27: 100 mL via INTRAVENOUS

## 2023-08-29 ENCOUNTER — Telehealth: Payer: Self-pay | Admitting: Gynecologic Oncology

## 2023-08-29 ENCOUNTER — Encounter: Payer: Self-pay | Admitting: Gynecologic Oncology

## 2023-08-29 NOTE — Telephone Encounter (Signed)
 Called patient. Discussed recent CT - reviewed findings in detail including hepatic steatosis. All questions answered.  Tiffany Garnet MD Gynecologic Oncology

## 2023-10-02 ENCOUNTER — Other Ambulatory Visit: Payer: Self-pay | Admitting: Hematology and Oncology

## 2023-10-26 ENCOUNTER — Encounter: Payer: Self-pay | Admitting: Gynecologic Oncology

## 2023-10-26 ENCOUNTER — Inpatient Hospital Stay: Payer: Medicaid Other | Attending: Gynecologic Oncology | Admitting: Gynecologic Oncology

## 2023-10-26 VITALS — BP 144/80 | HR 89 | Temp 98.6°F | Resp 16 | Ht 63.78 in | Wt 131.8 lb

## 2023-10-26 DIAGNOSIS — Z90722 Acquired absence of ovaries, bilateral: Secondary | ICD-10-CM | POA: Diagnosis not present

## 2023-10-26 DIAGNOSIS — Z90710 Acquired absence of both cervix and uterus: Secondary | ICD-10-CM | POA: Insufficient documentation

## 2023-10-26 DIAGNOSIS — Z8542 Personal history of malignant neoplasm of other parts of uterus: Secondary | ICD-10-CM | POA: Diagnosis not present

## 2023-10-26 DIAGNOSIS — Z9221 Personal history of antineoplastic chemotherapy: Secondary | ICD-10-CM | POA: Insufficient documentation

## 2023-10-26 DIAGNOSIS — N952 Postmenopausal atrophic vaginitis: Secondary | ICD-10-CM

## 2023-10-26 DIAGNOSIS — Z9079 Acquired absence of other genital organ(s): Secondary | ICD-10-CM | POA: Insufficient documentation

## 2023-10-26 DIAGNOSIS — Z08 Encounter for follow-up examination after completed treatment for malignant neoplasm: Secondary | ICD-10-CM | POA: Insufficient documentation

## 2023-10-26 DIAGNOSIS — C579 Malignant neoplasm of female genital organ, unspecified: Secondary | ICD-10-CM

## 2023-10-26 DIAGNOSIS — B351 Tinea unguium: Secondary | ICD-10-CM | POA: Diagnosis not present

## 2023-10-26 NOTE — Patient Instructions (Signed)
 It was good to see you today.  I do not see or feel any evidence of cancer recurrence on your exam.  I will see you for follow-up in 3 months.  As always, if you develop any new and concerning symptoms before your next visit, please call to see me sooner.

## 2023-10-26 NOTE — Progress Notes (Signed)
 Gynecologic Oncology Return Clinic Visit  10/26/23  Reason for Visit: surveillance   Treatment History: Oncology History Overview Note  HER2 negative, Neg Genetics MMR Normal P53 positive   Uterine cancer (HCC)  12/03/2021 Initial Diagnosis   Patient reports several month history of intermittent pelvic pain that she describes as discomfort or sometimes cramping, that has worsened with time.  She notes that this is tolerable.  She began having postmenopausal bleeding started on 6/17 with a few spots of blood.     12/29/2021 Imaging   1. Bilateral large multiloculated cystic lesions of the ovaries, concerning for ovarian malignancy. Recommend gynecologic consultation and pelvic ultrasound and/or pelvic MRI with contrast for further evaluation. 2. Large heterogeneous mass of the posterior pelvis likely arising from the lower uterus, possibly a fibroid, although malignancy is also a concern. Recommend attention on pelvic ultrasound or MRI as above. 3. Additional mass arising from the right uterine fundus which correlates with fibroid described on prior 2015 ultrasound. 4. Nonspecific small ground-glass nodule of the right lower lobe measuring 6 mm. Recommend follow-up chest CT in 6 months for further evaluation. 5. Aortic Atherosclerosis (ICD10-I70.0).   12/30/2021 Pathology Results   A. CERVIX, BIOPSY:  -  Scantly cellular specimen with predominantly blood, fibrin and acute inflammatory infiltrate but with scattered highly atypical cells highly suspicious for malignancy.   Note: A p16 shows strong positivity in these scattered cells; however, while p53 is mildly increased definitive strong overexpression or under expression is not identified.  These findings support the suspicion for malignancy; however, are insufficient for a definitive diagnosis.     01/03/2022 Imaging   MRI pelvis  1. Large mixed solid and cystic lesions of the bilateral ovaries, measuring 8.4 x 7.0 x 5.4 cm on the right  and 10.3 x 8.7 x 8.1 cm on the left, highly concerning for primary ovarian malignancy and contralateral ovarian metastasis.   2. Homogeneously enhancing mass arising from the right aspect of the uterine body and fundus measuring 6.0 x 5.5 x 4.3 cm, consistent with a uterine fibroid and similar to prior ultrasound dated 2015.   3. An additional, much more heterogeneous mass which appears to arise from the cervix or posterior lower uterine segment measuring 8.1 x 6.4 x 6.2 cm. This may reflect an additional fibroid with internal degeneration, however this was not clearly visualized on ultrasound dated 2015 and is highly suspicious for a cervical mass as significant enlargement of benign fibroids is not expected following menopause. This mass appears to closely abut and compress or perhaps even directly involve the adjacent rectum.   4.  No evidence of lymphadenopathy in the pelvis.   01/12/2022 PET scan   1. Hypermetabolic heterogeneous mass centered on the cervix/lower uterine segment is most consistent with primary gynecologic neoplasm. 2. Multiloculated complex bilateral adnexal cystic lesions with hypermetabolic soft tissue component likely reflect metastatic lesions. 3. No hypermetabolic abdominopelvic adenopathy. 4. No evidence of hypermetabolic metastatic disease in the chest or abdomen.   01/18/2022 Pathology Results   FINAL MICROSCOPIC DIAGNOSIS:   A. CERVICAL MASS, BIOPSIES:  - Poorly differentiated carcinoma with necrosis, consistent with high-grade serous carcinoma, see comment   B. CERVICAL MASS, EXCISION:  - Poorly differentiated carcinoma with necrosis, consistent with high-grade serous carcinoma see comment  - Edges of resected tissue fragments are positive for carcinoma   COMMENT:   A and B.   The tumor cells are positive for PAX8, ER and p16 immunostains.  Immunostain for p53 shows significant overexpression.  Immunostain for CK5/6 shows weak focal labeling while immunostain for   p63 is negative.  The immunoprofile is consistent with above interpretation.  The carcinoma is likely endometrial or ovarian origin with secondary involvement of the cervix.     01/23/2022 Initial Diagnosis   Uterine cancer (HCC)   01/23/2022 Cancer Staging   Staging form: Corpus Uteri - Carcinoma and Carcinosarcoma, AJCC 8th Edition - Clinical stage from 01/23/2022: FIGO Stage IIIA (cT3a, cN0, cM0) - Signed by Almeda Jacobs, MD on 01/23/2022 Stage prefix: Initial diagnosis   01/31/2022 - 01/31/2022 Chemotherapy   Patient is on Treatment Plan : UTERINE ENDOMETRIAL Dostarlimab -gxly (500 mg) + Carboplatin  (AUC 5) + Paclitaxel  (175 mg/m2) q21d x 6 cycles / Dostarlimab -gxly (1000 mg) q42d x 6 cycles      01/31/2022 - 05/01/2023 Chemotherapy   Patient is on Treatment Plan : UTERINE ENDOMETRIAL Dostarlimab -gxly (500 mg) + Carboplatin  (AUC 5) + Paclitaxel  (175 mg/m2) q21d x 6 cycles / Dostarlimab -gxly (1000 mg) q42d x 6 cycles      04/14/2022 PET scan   1. Significant interval positive response to therapy. Mildly hypermetabolic solid 5.5 x 2.8 cm posterior uterine cervix mass and mildly hypermetabolic bilateral adnexal mixed cystic and solid masses are all significantly decreased in size and metabolism. 2. No new or progressive hypermetabolic metastatic disease. 3. Low level FDG uptake associated with healing subacute sclerotic nondisplaced anterior left third, fourth and fifth rib fractures without associated discrete osseous lesions, correlate for history of interval injury in this location. 4. Horseshoe kidney.   05/03/2022 Surgery   Robotic-assisted laparoscopic total hysterectomy with bilateral salpingo-oophorectomy, tumor debulking including infracolic omentectomy, mini-lap, cystoscopy, and vaginal laceration repair   Findings:  On speculum exam, normal-appearing cervix that is flush with the vaginal apex.  No visible cervical lesion.  On EUA, moderately mobile, enlarged uterus with mobile fullness  appreciated in the cul-de-sac.  On intra-abdominal entry, normal upper abdominal survey.  Some adhesions of the omentum to the anterior abdominal wall below the umbilicus were noted.  Normal-appearing omentum, small and large bowel.  No ascites.  Right ovary minimally enlarged but overall normal in appearance.  Left ovary replaced by a 6 cm cystic mass, smooth.  Normal-appearing bilateral fallopian tubes.  Bladder was somewhat adherent anteriorly to the cervix.  Uterus approximately 8 cm and deviated to the left given a 10 cm anterior and right-sided fibroid.  The cervix itself did not appear enlarged, either on intra-abdominal phonation or speculum exam.  Posteriorly, a white ridge of somewhat indurated tissue was noted underneath the cervix and just above the rectum.  On palpation, this ridge could be appreciated only on rectovaginal exam.  It was separate and free from the cervix itself.  It was difficult to distinguish whether this was treated tumor or active tumor although its detached location from the cervix and uterus was noted.  Given my conversation with the patient prior to surgery and that she would not want an ostomy (even temporarily), there was no way to resect this area out without significant risk of damage to the rectum.  Given its location, even in the setting of a bowel prep (which she had not had), she would likely require at least a protective diverting ostomy. Cystoscopy, bladder dome noted to be intact and good efflux seen from bilateral ureteral orifices.   05/03/2022 Pathology Results   A. UTERUS, CERVIX, BILATERAL TUBES AND OVARIES, RESECTION:  - Cervix      Nabothian cysts      Negative  for malignancy  - Endometrium      Inactive endometrium      Two benign endometrial polyps      Negative for malignancy  - Myometrium      Leiomyoma      Negative for malignancy  - Right ovary      Hemorrhagic cyst with fibrosis, hemosiderin and focal calcification      Negative for  malignancy  - Left ovary      Hemorrhagic cyst with fibrosis, hemosiderin and focal calcification      Negative for malignancy  - Bilateral fallopian tubes      Unremarkable      Negative for malignancy  - See oncology table   B. OMENTUM, EXCISION:  - Benign omental adipose tissue.  - Negative for malignancy.     Genetic Testing   Negative genetic testing. No pathogenic variants identified on the [facility] CancerNext-Expanded+RNA panel. VUS in SDHA called [id number] identified. The report date is 05/03/2022.  The CancerNext-Expanded + RNAinsight gene panel offered by [employer] and includes sequencing and rearrangement analysis for the following 77 genes: IP, ALK, APC*, ATM*, AXIN2, BAP1, BARD1, BLM, BMPR1A, BRCA1*, BRCA2*, BRIP1*, CDC73, CDH1*,CDK4, CDKN1B, CDKN2A, CHEK2*, CTNNA1, DICER1, FANCC, FH, FLCN, GALNT12, KIF1B, LZTR1, MAX, MEN1, MET, MLH1*, MSH2*, MSH3, MSH6*, MUTYH*, NBN, NF1*, NF2, NTHL1, PALB2*, PHOX2B, PMS2*, POT1, PRKAR1A, PTCH1, PTEN*, RAD51C*, RAD51D*,RB1, RECQL, RET, SDHA, SDHAF2, SDHB, SDHC, SDHD, SMAD4, SMARCA4, SMARCB1, SMARCE1, STK11, SUFU, TMEM127, TP53*,TSC1, TSC2, VHL and XRCC2 (sequencing and deletion/duplication); EGFR, EGLN1, HOXB13, KIT, MITF, PDGFRA, POLD1 and POLE (sequencing only); EPCAM and GREM1 (deletion/duplication only).   08/14/2022 Imaging   1. Prior hysterectomy without new suspicious enhancing soft tissue nodularity along the vaginal cuff. 2. Decreased size of the low-density focus along the left external iliac vessels, nonspecific. Interval decrease in size somewhat reassuring, and this is favored to reflect a small lymphocele or loculated fluid. However in the context of ongoing chemotherapy the interval decrease in size may reflect treatment response and as such while not favored metastatic disease can not be excluded. Continued attention on follow-up imaging is suggested. 3. No evidence of new or progressive disease in the abdomen or pelvis. 4. Diffuse  hepatic steatosis.   11/02/2022 Imaging   CT ABDOMEN PELVIS W CONTRAST  Result Date: 11/02/2022 CLINICAL DATA:  Pelvic pain. Uterine carcinoma. Evaluate for recurrence. * Tracking Code: BO * EXAM: CT ABDOMEN AND PELVIS WITH CONTRAST TECHNIQUE: Multidetector CT imaging of the abdomen and pelvis was performed using the standard protocol following bolus administration of intravenous contrast. RADIATION DOSE REDUCTION: This exam was performed according to the departmental dose-optimization program which includes automated exposure control, adjustment of the mA and/or kV according to patient size and/or use of iterative reconstruction technique. CONTRAST:  OMNIPAQUE  IOHEXOL  300 MG/ML  SOLN COMPARISON:  08/11/2022 FINDINGS: Lower Chest: No acute findings. Hepatobiliary: No hepatic masses identified. Moderate diffuse hepatic steatosis. Gallbladder is unremarkable. No evidence of biliary ductal dilatation. Pancreas:  No mass or inflammatory changes. Spleen: Within normal limits in size and appearance. 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 No suspicious masses identified. No evidence of ureteral calculi or hydronephrosis. Unremarkable unopacified urinary bladder. Stomach/Bowel: No evidence of obstruction, inflammatory process or abnormal fluid collections. Normal appendix visualized. Vascular/Lymphatic: No pathologically enlarged lymph nodes. No acute vascular findings. Reproductive: Prior hysterectomy noted. No evidence of pelvic mass or free fluid. No evidence of peritoneal or omental soft tissue nodularity. Other:  None. Musculoskeletal:  No suspicious bone lesions identified. IMPRESSION: No  acute findings. No evidence of recurrent or metastatic carcinoma within the abdomen or pelvis. Hepatic steatosis. Horseshoe kidney. Electronically Signed   By: Marlyce Sine M.D.   On: 11/02/2022 15:24      12/22/2022 Procedure   Successful removal of implanted Port-A-Cath.    02/20/2023 Imaging   CT ABDOMEN  PELVIS W CONTRAST  Result Date: 02/26/2023 CLINICAL DATA:  Endometrial cancer restaging, abdominal and pelvic pain * Tracking Code: BO * EXAM: CT ABDOMEN AND PELVIS WITH CONTRAST TECHNIQUE: Multidetector CT imaging of the abdomen and pelvis was performed using the standard protocol following bolus administration of intravenous contrast. RADIATION DOSE REDUCTION: This exam was performed according to the departmental dose-optimization program which includes automated exposure control, adjustment of the mA and/or kV according to patient size and/or use of iterative reconstruction technique. CONTRAST:  OMNIPAQUE  IOHEXOL  300 MG/ML  SOLN COMPARISON:  11/01/2022 FINDINGS: Lower chest: No acute abnormality. Hepatobiliary: No solid liver abnormality is seen. Severe hepatic steatosis. Hepatomegaly, maximum coronal span 20.6 cm. No gallstones, gallbladder wall thickening, or biliary dilatation. Pancreas: Unchanged atrophic, calcified appearance of the pancreatic tail. No pancreatic ductal dilatation or surrounding inflammatory changes. Spleen: Normal in size without significant abnormality. Adrenals/Urinary Tract: Adrenal glands are unremarkable. Horseshoe kidney. No calculi or hydronephrosis. Bladder is unremarkable. Stomach/Bowel: Stomach is within normal limits. Appendix appears normal. No evidence of bowel wall thickening, distention, or inflammatory changes. Vascular/Lymphatic: Aortic atherosclerosis. No enlarged abdominal or pelvic lymph nodes. Reproductive: Status post hysterectomy. Other: No abdominal wall hernia or abnormality. No ascites. Musculoskeletal: No acute or significant osseous findings. IMPRESSION: 1. No acute CT findings of the abdomen or pelvis to explain abdominal pain. 2. Status post hysterectomy. No evidence of lymphadenopathy or metastatic disease in the abdomen or pelvis. 3. Severe hepatic steatosis and hepatomegaly. 4. Horseshoe kidney. Aortic Atherosclerosis (ICD10-I70.0). Electronically  Signed   By: Fredricka Jenny M.D.   On: 02/26/2023 13:54        Interval History: Doing well.  Still struggling with nail fungus.  Denies any vaginal bleeding or discharge.  Reports baseline bowel bladder function.  Denies any abdominal or pelvic pain.  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   oncologist-- dr Marton Sleeper;   dx 06/ 2023   Multinodular thyroid     bilateral nodules ;  hx bx 07-31-2017 benign ;  last ultrasound in epic 01-08-2020   Neuropathy due to chemotherapeutic drug [facility])    Uterine cancer [facility]) 01/2022   gyn oncologist-- dr Sten Dematteo/  oncologist--- dr Marton Sleeper;  dx 01-23-2022,  started chemo 01-31-2022    Past Surgical History:  Procedure Laterality Date   CESAREAN SECTION     COLONOSCOPY  2016   dr Royston Cornea Cherene Core)   CYSTOSCOPY N/A 05/03/2022   Procedure: CYSTOSCOPY;  Surgeon: Suzi Essex, MD;  Location: [facility];  Service: Gynecology;  Laterality: N/A;   DILATION AND EVACUATION  08/17/2000   @[facility]  for missed ab   IR IMAGING GUIDED PORT INSERTION  01/25/2022   IR REMOVAL TUN ACCESS W/ PORT W/O FL MOD SED  12/22/2022   LESION REMOVAL N/A 01/18/2022   Procedure: CERVICAL BIOPSIES;  Surgeon: Suzi Essex, MD;  Location: [facility];  Service: Gynecology;  Laterality: N/A;   ROBOTIC ASSISTED TOTAL HYSTERECTOMY  WITH BILATERAL SALPINGO OOPHERECTOMY  05/03/2022   @[facility]  by dr Orvil Bland ;   with mini lap, omentectomy, tumor debulking    Family History  Problem Relation Age of Onset   Thyroid  disease Mother    Breast cancer Mother        dx late 52s   Cancer Other        unk type, d. 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  Tobacco Use   Smoking status: Never   Smokeless tobacco: Never  Vaping Use   Vaping status: Never Used  Substance and Sexual Activity   Alcohol use: Never   Drug use: Never   Sexual activity: Not Currently  Other Topics Concern   Not on file  Social History Narrative   Not on file   Social Drivers of Health   Financial Resource Strain: Not on file  Food Insecurity: Low Risk  (06/20/2023)   Received from [facility]   Hunger Vital Sign    Worried About Running Out of Food in the Last Year: Never true    Ran Out of Food in the Last Year: Never true  Transportation Needs: No Transportation Needs (06/20/2023)   Received from [facility]    In the past 12 months, has lack of reliable transportation kept you from medical appointments, meetings, work or from getting things needed for daily living? : No  Physical Activity: Not on file  Stress: Not on file  Social Connections: Unknown (01/05/2022)   Received from [facility]   Social Network    Social Network: Not on file    Current Medications:  Current Outpatient Medications:    ascorbic acid (VITAMIN C) 250 MG CHEW, Chew 500 mg by mouth daily., Disp: , Rfl:    buPROPion (WELLBUTRIN XL) 150 MG 24 hr tablet, Take by mouth., Disp: , Rfl:    calcium carbonate (TUMS - DOSED IN MG ELEMENTAL CALCIUM) 500 MG chewable tablet, Chew 1 tablet by mouth daily., Disp: , Rfl:    Cholecalciferol (VITAMIN D3 PO), Take 1 tablet by mouth daily., Disp: , Rfl:    eszopiclone (LUNESTA) 1 MG TABS tablet, Take 1 tablet by mouth daily., Disp: , Rfl:    metFORMIN (GLUCOPHAGE-XR) 500 MG 24 hr tablet, Take 1 tablet by mouth twice daily. Increase to 2 tablets twice  daily after 1 week. Take with food, Disp: , Rfl:    Multiple Vitamin (MULTIVITAMIN WITH MINERALS) TABS tablet, Take 1 tablet by mouth daily., Disp: , Rfl:    SYNTHROID  75 MCG tablet, Take 1 tablet (75 mcg) by mouth daily before breakfast., Disp: 30 tablet, Rfl: 1  Review of Systems: Denies appetite changes, fevers, chills, fatigue, unexplained weight changes. Denies hearing loss, neck lumps or masses, mouth sores, ringing in ears or voice changes. Denies cough or wheezing.  Denies shortness of breath. Denies chest pain or palpitations. Denies leg swelling. Denies abdominal distention, pain, blood in stools, constipation, diarrhea, nausea, vomiting, or  early satiety. Denies pain with intercourse, dysuria, frequency, hematuria or incontinence. Denies hot flashes, pelvic pain, vaginal bleeding or vaginal discharge.   Denies joint pain, back pain or muscle pain/cramps. Denies itching, rash, or wounds. Denies dizziness, headaches, numbness or seizures. Denies swollen lymph nodes or glands, denies easy bruising or bleeding. Denies anxiety, depression, confusion, or decreased concentration.  Physical Exam: BP (!) 143/62 (BP Location: Left Arm, Patient Position: Sitting) Comment: CMA notified  Pulse 89   Temp 98.6 F (37 C) (Oral)   Resp 16   Ht 5' 3.78" (1.62 m)   Wt 131 lb 12.8 oz (59.8 kg)   SpO2 100%   BMI 22.78 kg/m  General: Alert, oriented, no acute distress. HEENT: Normocephalic, atraumatic, sclera anicteric.  One shotty mandibular lymph node palpated bilaterally. Chest: Unlabored breathing on room air.  Lungs clear to auscultation bilaterally, no wheezes or rhonchi. Cardiovascular: Regular rate and rhythm, no murmurs or rubs appreciated. Abdomen: soft, nontender.  Normoactive bowel sounds.  No masses or hepatosplenomegaly appreciated.  Well-healed incisions. Extremities: Grossly normal range of motion.  Warm, well perfused.  No edema bilaterally. Thinning and white discoloration of  most of her nail beds on hands bilaterally. Lymphatics: No inguinal, clavicular neuropathy. GU: Normal appearing external genitalia without erythema, excoriation, or lesions.  Speculum exam with extra small speculum is somewhat tolerated by the patient, not as well-tolerated as prior exams with premedication.  On today's exam, I am able to insert the speculum almost in its entire length and open minimally.  Cannot quite see the apex but the vaginal tissue that I am able to visualize is normal in appearance.  On single digit bimanual exam, I was able to feel to the vaginal cuff, no nodularity or masses appreciated.  Some agglutination of the posterior introitus.   Laboratory & Radiologic Studies: None new  Assessment & Plan: Tiffany [name] is a 63 y.o. 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 Last imaging 08/2023 without evidence of recurrent disease. Stable 4 mm right lower lobe pulmonary nodule, favored to be benign.  Stable faint soft tissue in the pelvis bilaterally, nonspecific.   The patient is doing very well.  She is NED on exam today.     Given side effects from immunotherapy, she ultimately decided to come off of treatment at the end of last year. Most recent CT scan is negative for recurrent disease.  Pelvic exam continues to be somewhat challenging although is better tolerated than previously (best tolerated with premedication).   CT imaging was scheduled in 6 months (02/2024).   She is following with her PCP for onychomycosis.   We reviewed signs and symptoms that would be concerning for disease recurrence.  I stressed the importance of calling if she develops any of these between visits.  I will plan to see her back for follow-up in 3-4 months.  20 minutes of total time was spent for this patient  encounter, including preparation, face-to-face counseling with the patient and coordination of care, and documentation of the encounter.  Wiley Hanger, MD  Division of Gynecologic Oncology  Department of Obstetrics and Gynecology  [facility]

## 2024-02-15 ENCOUNTER — Inpatient Hospital Stay: Attending: Gynecologic Oncology | Admitting: Gynecologic Oncology

## 2024-02-15 ENCOUNTER — Encounter: Payer: Self-pay | Admitting: Gynecologic Oncology

## 2024-02-15 VITALS — BP 156/78 | HR 82 | Temp 98.5°F | Resp 19 | Wt 134.2 lb

## 2024-02-15 DIAGNOSIS — Z90722 Acquired absence of ovaries, bilateral: Secondary | ICD-10-CM | POA: Diagnosis not present

## 2024-02-15 DIAGNOSIS — Z08 Encounter for follow-up examination after completed treatment for malignant neoplasm: Secondary | ICD-10-CM | POA: Insufficient documentation

## 2024-02-15 DIAGNOSIS — C579 Malignant neoplasm of female genital organ, unspecified: Secondary | ICD-10-CM

## 2024-02-15 DIAGNOSIS — Z8542 Personal history of malignant neoplasm of other parts of uterus: Secondary | ICD-10-CM | POA: Diagnosis present

## 2024-02-15 DIAGNOSIS — Z9079 Acquired absence of other genital organ(s): Secondary | ICD-10-CM | POA: Diagnosis not present

## 2024-02-15 DIAGNOSIS — Z9221 Personal history of antineoplastic chemotherapy: Secondary | ICD-10-CM | POA: Insufficient documentation

## 2024-02-15 DIAGNOSIS — Z90710 Acquired absence of both cervix and uterus: Secondary | ICD-10-CM | POA: Insufficient documentation

## 2024-02-15 DIAGNOSIS — L811 Chloasma: Secondary | ICD-10-CM

## 2024-02-15 NOTE — Patient Instructions (Addendum)
 It was good to see you today.  I do not see or feel any evidence of cancer recurrence on your exam.  I will see you for follow-up in 3 months.  As always, if you develop any new and concerning symptoms before your next visit, please call to see me sooner.   The nail fungus product that I showed you today is called toenail fungus treatment by foot cure.

## 2024-02-15 NOTE — Progress Notes (Signed)
 Gynecologic Oncology Return Clinic Visit  02/15/24  Reason for Visit: surveillance   Treatment History: Oncology History Overview Note  HER2 negative, Neg Genetics MMR Normal P53 positive   Uterine cancer (HCC)  12/03/2021 Initial Diagnosis   Patient reports several month history of intermittent pelvic pain that she describes as discomfort or sometimes cramping, that has worsened with time.  She notes that this is tolerable.  She began having postmenopausal bleeding started on 6/17 with a few spots of blood.     12/29/2021 Imaging   1. Bilateral large multiloculated cystic lesions of the ovaries, concerning for ovarian malignancy. Recommend gynecologic consultation and pelvic ultrasound and/or pelvic MRI with contrast for further evaluation. 2. Large heterogeneous mass of the posterior pelvis likely arising from the lower uterus, possibly a fibroid, although malignancy is also a concern. Recommend attention on pelvic ultrasound or MRI as above. 3. Additional mass arising from the right uterine fundus which correlates with fibroid described on prior 2015 ultrasound. 4. Nonspecific small ground-glass nodule of the right lower lobe measuring 6 mm. Recommend follow-up chest CT in 6 months for further evaluation. 5. Aortic Atherosclerosis (ICD10-I70.0).   12/30/2021 Pathology Results   A. CERVIX, BIOPSY:  -  Scantly cellular specimen with predominantly blood, fibrin and acute inflammatory infiltrate but with scattered highly atypical cells highly suspicious for malignancy.   Note: A p16 shows strong positivity in these scattered cells; however, while p53 is mildly increased definitive strong overexpression or under expression is not identified.  These findings support the suspicion for malignancy; however, are insufficient for a definitive diagnosis.     01/03/2022 Imaging   MRI pelvis  1. Large mixed solid and cystic lesions of the bilateral ovaries, measuring 8.4 x 7.0 x 5.4 cm on the right  and 10.3 x 8.7 x 8.1 cm on the left, highly concerning for primary ovarian malignancy and contralateral ovarian metastasis.   2. Homogeneously enhancing mass arising from the right aspect of the uterine body and fundus measuring 6.0 x 5.5 x 4.3 cm, consistent with a uterine fibroid and similar to prior ultrasound dated 2015.   3. An additional, much more heterogeneous mass which appears to arise from the cervix or posterior lower uterine segment measuring 8.1 x 6.4 x 6.2 cm. This may reflect an additional fibroid with internal degeneration, however this was not clearly visualized on ultrasound dated 2015 and is highly suspicious for a cervical mass as significant enlargement of benign fibroids is not expected following menopause. This mass appears to closely abut and compress or perhaps even directly involve the adjacent rectum.   4.  No evidence of lymphadenopathy in the pelvis.   01/12/2022 PET scan   1. Hypermetabolic heterogeneous mass centered on the cervix/lower uterine segment is most consistent with primary gynecologic neoplasm. 2. Multiloculated complex bilateral adnexal cystic lesions with hypermetabolic soft tissue component likely reflect metastatic lesions. 3. No hypermetabolic abdominopelvic adenopathy. 4. No evidence of hypermetabolic metastatic disease in the chest or abdomen.   01/18/2022 Pathology Results   FINAL MICROSCOPIC DIAGNOSIS:   A. CERVICAL MASS, BIOPSIES:  - Poorly differentiated carcinoma with necrosis, consistent with high-grade serous carcinoma, see comment   B. CERVICAL MASS, EXCISION:  - Poorly differentiated carcinoma with necrosis, consistent with high-grade serous carcinoma see comment  - Edges of resected tissue fragments are positive for carcinoma   COMMENT:   A and B.   The tumor cells are positive for PAX8, ER and p16 immunostains.  Immunostain for p53 shows significant overexpression.  Immunostain for CK5/6 shows weak focal labeling while immunostain for   p63 is negative.  The immunoprofile is consistent with above interpretation.  The carcinoma is likely endometrial or ovarian origin with secondary involvement of the cervix.     01/23/2022 Initial Diagnosis   Uterine cancer (HCC)   01/23/2022 Cancer Staging   Staging form: Corpus Uteri - Carcinoma and Carcinosarcoma, AJCC 8th Edition - Clinical stage from 01/23/2022: FIGO Stage IIIA (cT3a, cN0, cM0) - Signed by Lonn Hicks, MD on 01/23/2022 Stage prefix: Initial diagnosis   01/31/2022 - 01/31/2022 Chemotherapy   Patient is on Treatment Plan : UTERINE ENDOMETRIAL Dostarlimab -gxly (500 mg) + Carboplatin  (AUC 5) + Paclitaxel  (175 mg/m2) q21d x 6 cycles / Dostarlimab -gxly (1000 mg) q42d x 6 cycles      01/31/2022 - 05/01/2023 Chemotherapy   Patient is on Treatment Plan : UTERINE ENDOMETRIAL Dostarlimab -gxly (500 mg) + Carboplatin  (AUC 5) + Paclitaxel  (175 mg/m2) q21d x 6 cycles / Dostarlimab -gxly (1000 mg) q42d x 6 cycles      04/14/2022 PET scan   1. Significant interval positive response to therapy. Mildly hypermetabolic solid 5.5 x 2.8 cm posterior uterine cervix mass and mildly hypermetabolic bilateral adnexal mixed cystic and solid masses are all significantly decreased in size and metabolism. 2. No new or progressive hypermetabolic metastatic disease. 3. Low level FDG uptake associated with healing subacute sclerotic nondisplaced anterior left third, fourth and fifth rib fractures without associated discrete osseous lesions, correlate for history of interval injury in this location. 4. Horseshoe kidney.   05/03/2022 Surgery   Robotic-assisted laparoscopic total hysterectomy with bilateral salpingo-oophorectomy, tumor debulking including infracolic omentectomy, mini-lap, cystoscopy, and vaginal laceration repair   Findings:  On speculum exam, normal-appearing cervix that is flush with the vaginal apex.  No visible cervical lesion.  On EUA, moderately mobile, enlarged uterus with mobile fullness  appreciated in the cul-de-sac.  On intra-abdominal entry, normal upper abdominal survey.  Some adhesions of the omentum to the anterior abdominal wall below the umbilicus were noted.  Normal-appearing omentum, small and large bowel.  No ascites.  Right ovary minimally enlarged but overall normal in appearance.  Left ovary replaced by a 6 cm cystic mass, smooth.  Normal-appearing bilateral fallopian tubes.  Bladder was somewhat adherent anteriorly to the cervix.  Uterus approximately 8 cm and deviated to the left given a 10 cm anterior and right-sided fibroid.  The cervix itself did not appear enlarged, either on intra-abdominal phonation or speculum exam.  Posteriorly, a white ridge of somewhat indurated tissue was noted underneath the cervix and just above the rectum.  On palpation, this ridge could be appreciated only on rectovaginal exam.  It was separate and free from the cervix itself.  It was difficult to distinguish whether this was treated tumor or active tumor although its detached location from the cervix and uterus was noted.  Given my conversation with the patient prior to surgery and that she would not want an ostomy (even temporarily), there was no way to resect this area out without significant risk of damage to the rectum.  Given its location, even in the setting of a bowel prep (which she had not had), she would likely require at least a protective diverting ostomy. Cystoscopy, bladder dome noted to be intact and good efflux seen from bilateral ureteral orifices.   05/03/2022 Pathology Results   A. UTERUS, CERVIX, BILATERAL TUBES AND OVARIES, RESECTION:  - Cervix      Nabothian cysts      Negative  for malignancy  - Endometrium      Inactive endometrium      Two benign endometrial polyps      Negative for malignancy  - Myometrium      Leiomyoma      Negative for malignancy  - Right ovary      Hemorrhagic cyst with fibrosis, hemosiderin and focal calcification      Negative for  malignancy  - Left ovary      Hemorrhagic cyst with fibrosis, hemosiderin and focal calcification      Negative for malignancy  - Bilateral fallopian tubes      Unremarkable      Negative for malignancy  - See oncology table   B. OMENTUM, EXCISION:  - Benign omental adipose tissue.  - Negative for malignancy.     Genetic Testing   Negative genetic testing. No pathogenic variants identified on the [facility] CancerNext-Expanded+RNA panel. VUS in SDHA called p.[id number] identified. The report date is 05/03/2022.  The CancerNext-Expanded + RNAinsight gene panel offered by [employer] and includes sequencing and rearrangement analysis for the following 77 genes: IP, ALK, APC*, ATM*, AXIN2, BAP1, BARD1, BLM, BMPR1A, BRCA1*, BRCA2*, BRIP1*, CDC73, CDH1*,CDK4, CDKN1B, CDKN2A, CHEK2*, CTNNA1, DICER1, FANCC, FH, FLCN, GALNT12, KIF1B, LZTR1, MAX, MEN1, MET, MLH1*, MSH2*, MSH3, MSH6*, MUTYH*, NBN, NF1*, NF2, NTHL1, PALB2*, PHOX2B, PMS2*, POT1, PRKAR1A, PTCH1, PTEN*, RAD51C*, RAD51D*,RB1, RECQL, RET, SDHA, SDHAF2, SDHB, SDHC, SDHD, SMAD4, SMARCA4, SMARCB1, SMARCE1, STK11, SUFU, TMEM127, TP53*,TSC1, TSC2, VHL and XRCC2 (sequencing and deletion/duplication); EGFR, EGLN1, HOXB13, KIT, MITF, PDGFRA, POLD1 and POLE (sequencing only); EPCAM and GREM1 (deletion/duplication only).   08/14/2022 Imaging   1. Prior hysterectomy without new suspicious enhancing soft tissue nodularity along the vaginal cuff. 2. Decreased size of the low-density focus along the left external iliac vessels, nonspecific. Interval decrease in size somewhat reassuring, and this is favored to reflect a small lymphocele or loculated fluid. However in the context of ongoing chemotherapy the interval decrease in size may reflect treatment response and as such while not favored metastatic disease can not be excluded. Continued attention on follow-up imaging is suggested. 3. No evidence of new or progressive disease in the abdomen or pelvis. 4. Diffuse  hepatic steatosis.   11/02/2022 Imaging   CT ABDOMEN PELVIS W CONTRAST  Result Date: 11/02/2022 CLINICAL DATA:  Pelvic pain. Uterine carcinoma. Evaluate for recurrence. * Tracking Code: BO * EXAM: CT ABDOMEN AND PELVIS WITH CONTRAST TECHNIQUE: Multidetector CT imaging of the abdomen and pelvis was performed using the standard protocol following bolus administration of intravenous contrast. RADIATION DOSE REDUCTION: This exam was performed according to the departmental dose-optimization program which includes automated exposure control, adjustment of the mA and/or kV according to patient size and/or use of iterative reconstruction technique. CONTRAST:  OMNIPAQUE  IOHEXOL  300 MG/ML  SOLN COMPARISON:  08/11/2022 FINDINGS: Lower Chest: No acute findings. Hepatobiliary: No hepatic masses identified. Moderate diffuse hepatic steatosis. Gallbladder is unremarkable. No evidence of biliary ductal dilatation. Pancreas:  No mass or inflammatory changes. Spleen: Within normal limits in size and appearance. 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 No suspicious masses identified. No evidence of ureteral calculi or hydronephrosis. Unremarkable unopacified urinary bladder. Stomach/Bowel: No evidence of obstruction, inflammatory process or abnormal fluid collections. Normal appendix visualized. Vascular/Lymphatic: No pathologically enlarged lymph nodes. No acute vascular findings. Reproductive: Prior hysterectomy noted. No evidence of pelvic mass or free fluid. No evidence of peritoneal or omental soft tissue nodularity. Other:  None. Musculoskeletal:  No suspicious bone lesions identified. IMPRESSION: No  acute findings. No evidence of recurrent or metastatic carcinoma within the abdomen or pelvis. Hepatic steatosis. Horseshoe kidney. Electronically Signed   By: Norleen DELENA Kil M.D.   On: 11/02/2022 15:24      12/22/2022 Procedure   Successful removal of implanted Port-A-Cath.    02/20/2023 Imaging   CT ABDOMEN  PELVIS W CONTRAST  Result Date: 02/26/2023 CLINICAL DATA:  Endometrial cancer restaging, abdominal and pelvic pain * Tracking Code: BO * EXAM: CT ABDOMEN AND PELVIS WITH CONTRAST TECHNIQUE: Multidetector CT imaging of the abdomen and pelvis was performed using the standard protocol following bolus administration of intravenous contrast. RADIATION DOSE REDUCTION: This exam was performed according to the departmental dose-optimization program which includes automated exposure control, adjustment of the mA and/or kV according to patient size and/or use of iterative reconstruction technique. CONTRAST:  OMNIPAQUE  IOHEXOL  300 MG/ML  SOLN COMPARISON:  11/01/2022 FINDINGS: Lower chest: No acute abnormality. Hepatobiliary: No solid liver abnormality is seen. Severe hepatic steatosis. Hepatomegaly, maximum coronal span 20.6 cm. No gallstones, gallbladder wall thickening, or biliary dilatation. Pancreas: Unchanged atrophic, calcified appearance of the pancreatic tail. No pancreatic ductal dilatation or surrounding inflammatory changes. Spleen: Normal in size without significant abnormality. Adrenals/Urinary Tract: Adrenal glands are unremarkable. Horseshoe kidney. No calculi or hydronephrosis. Bladder is unremarkable. Stomach/Bowel: Stomach is within normal limits. Appendix appears normal. No evidence of bowel wall thickening, distention, or inflammatory changes. Vascular/Lymphatic: Aortic atherosclerosis. No enlarged abdominal or pelvic lymph nodes. Reproductive: Status post hysterectomy. Other: No abdominal wall hernia or abnormality. No ascites. Musculoskeletal: No acute or significant osseous findings. IMPRESSION: 1. No acute CT findings of the abdomen or pelvis to explain abdominal pain. 2. Status post hysterectomy. No evidence of lymphadenopathy or metastatic disease in the abdomen or pelvis. 3. Severe hepatic steatosis and hepatomegaly. 4. Horseshoe kidney. Aortic Atherosclerosis (ICD10-I70.0). Electronically  Signed   By: Marolyn JONETTA Jaksch M.D.   On: 02/26/2023 13:54        Interval History: Doing well.  Recently traveled to [place] in [place].  Reports intermittent pelvic discomfort, which she describes as pain in her deep pelvis when she is sitting for periods.  This does not happen all of the time.  Never feels this discomfort in the morning when she gets up.  Endorses normal bowel and bladder function.  Denies any vaginal bleeding.  Saw dermatologist recently due to some hyperpigmentation on her face and right neck.  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   oncologist-- dr lonn;   dx 06/ 2023   Multinodular thyroid     bilateral nodules ;  hx bx 07-31-2017 benign ;  last ultrasound in epic 01-08-2020   Neuropathy due to chemotherapeutic drug [facility])    Uterine cancer [facility]) 01/2022   gyn oncologist-- dr Jamarion [place]/  oncologist--- dr lonn;  dx 01-23-2022,  started chemo 01-31-2022    Past Surgical History:  Procedure Laterality Date   CESAREAN SECTION     COLONOSCOPY  2016   dr lynwood gwen)   CYSTOSCOPY N/A 05/03/2022   Procedure: CYSTOSCOPY;  Surgeon: Viktoria Comer SAUNDERS, MD;  Location: [facility];  Service: Gynecology;  Laterality: N/A;   DILATION AND EVACUATION  08/17/2000   @[facility]  for missed ab   IR IMAGING GUIDED PORT INSERTION  01/25/2022   IR REMOVAL TUN ACCESS W/ PORT W/O FL MOD SED  12/22/2022   LESION REMOVAL N/A 01/18/2022   Procedure: CERVICAL BIOPSIES;  Surgeon: Viktoria Comer SAUNDERS, MD;  Location: [facility];  Service: Gynecology;  Laterality: N/A;   ROBOTIC ASSISTED TOTAL HYSTERECTOMY WITH BILATERAL SALPINGO OOPHERECTOMY  05/03/2022   @[facility]  by dr viktoria ;   with mini lap, omentectomy, tumor debulking    Family History  Problem Relation Age of Onset    Thyroid  disease Mother    Breast cancer Mother        dx late 73s   Cancer Other        unk type, d. 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  Tobacco Use   Smoking status: Never   Smokeless tobacco: Never  Vaping Use   Vaping status: Never Used  Substance and Sexual Activity   Alcohol use: Never   Drug use: Never   Sexual activity: Not Currently  Other Topics Concern   Not on file  Social History Narrative   Not on file   Social Drivers of Health   Financial Resource Strain: Not on file  Food Insecurity: Low Risk  (06/20/2023)   Received from [facility]   Hunger Vital Sign    Within the past 12 months, you worried that your food would run out before you got money to buy more: Never true    Within the past 12 months, the food you bought just didn't last and you didn't have money to get more. : Never true  Transportation Needs: No Transportation Needs (06/20/2023)   Received from [facility]    In the past 12 months, has lack of reliable transportation kept you from medical appointments, meetings, work or from getting things needed for daily living? : No  Physical Activity: Not on file  Stress: Not on file  Social Connections: Unknown (01/05/2022)   Received from [facility]   Social Network    Social Network: Not on file    Current Medications:  Current Outpatient Medications:    ascorbic acid (VITAMIN C) 250 MG CHEW, Chew 500 mg by mouth daily., Disp: , Rfl:    buPROPion (WELLBUTRIN XL) 150 MG 24 hr tablet, Take by mouth., Disp: , Rfl:    calcium carbonate (TUMS - DOSED  IN MG ELEMENTAL CALCIUM) 500 MG chewable tablet, Chew 1 tablet by mouth daily., Disp: , Rfl:    Cholecalciferol (VITAMIN D3 PO), Take 1 tablet by mouth daily., Disp: , Rfl:    eszopiclone (LUNESTA) 1 MG TABS tablet, Take 1 tablet by mouth daily., Disp: , Rfl:    metFORMIN (GLUCOPHAGE-XR) 500 MG 24 hr tablet, Take 1 tablet by mouth twice daily. Increase to 2 tablets twice daily after 1 week. Take with food, Disp: , Rfl:    Multiple Vitamin (MULTIVITAMIN WITH MINERALS) TABS tablet, Take 1 tablet by mouth daily., Disp: , Rfl:    SYNTHROID  75 MCG tablet, Take 1 tablet (  75 mcg) by mouth daily before breakfast., Disp: 30 tablet, Rfl: 1  Review of Systems: + anxiety, depression Denies appetite changes, fevers, chills, fatigue, unexplained weight changes. Denies hearing loss, neck lumps or masses, mouth sores, ringing in ears or voice changes. Denies cough or wheezing.  Denies shortness of breath. Denies chest pain or palpitations. Denies leg swelling. Denies abdominal distention, pain, blood in stools, constipation, diarrhea, nausea, vomiting, or early satiety. Denies pain with intercourse, dysuria, frequency, hematuria or incontinence. Denies hot flashes, pelvic pain, vaginal bleeding or vaginal discharge.   Denies joint pain, back pain or muscle pain/cramps. Denies itching, rash, or wounds. Denies dizziness, headaches, numbness or seizures. Denies swollen lymph nodes or glands, denies easy bruising or bleeding. Denies confusion or decreased concentration.  Physical Exam: BP (!) 164/73 (BP Location: Left Arm, Patient Position: Sitting)   Pulse 82   Temp 98.5 F (36.9 C) (Oral)   Resp 19   Wt 134 lb 3.2 oz (60.9 kg)   SpO2 100%   BMI 23.19 kg/m  General: Alert, oriented, no acute distress. HEENT: Normocephalic, atraumatic, sclera anicteric.  Some hyperpigmentation along forehead, lateral face bilaterally, right neck Pulmonary: Unlabored breathing on room air.  Lungs clear to auscultation  bilaterally, no wheezes or rhonchi. Cardiovascular: Regular rate and rhythm, no murmurs or rubs appreciated. Abdomen: soft, nontender.  Normoactive bowel sounds.  No masses or hepatosplenomegaly appreciated.  Well-healed incisions. Extremities: Grossly normal range of motion.  Warm, well perfused.  No edema bilaterally. Thinning and white discoloration of most of her nail beds on hands bilaterally. Lymphatics: No inguinal, clavicular neuropathy. GU: Normal appearing external genitalia without erythema, excoriation, or lesions.  Speculum exam with extra small speculum is poorly tolerated by the patient,.  On today's exam, I am able to insert the speculum just a few cm and was not able to open.  On single digit bimanual exam, I was able to feel most of the length of the vagina, no nodularity or masses appreciated.  Some agglutination of the posterior introitus.   Laboratory & Radiologic Studies: None new  Assessment & Plan: Tiffany [name] is a 63 y.o. 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 Last imaging 08/2023 without evidence of recurrent disease. Stable 4 mm right lower lobe pulmonary nodule, favored to be benign.  Stable faint soft tissue in the pelvis bilaterally, nonspecific.   The patient is doing very well.  She is NED on exam today.     Given side effects from immunotherapy, she ultimately decided to come off of treatment at the end of last year. Most recent CT scan is negative for recurrent disease.  Pelvic exam continues to be somewhat challenging although is better tolerated than previously (best tolerated with premedication).   CT imaging is scheduled in 02/2024.   She is following with her PCP for onychomycosis.  Discussed recent skin changes which appear consistent with melasma.  It sounds like  based on her discussion with the dermatologist, there was some question whether this could be related to hormonal factors.  She has thyroid  disease, so this could be related.  I think more likely it may be in the setting of recent sun exposure.  I have asked her to keep me posted if there are any changes over the next several weeks.   We reviewed signs and symptoms that would be concerning for  disease recurrence.  I stressed the importance of calling if she develops any of these between visits.  I will plan to see her back for follow-up in 3-4 months.  22 minutes of total time was spent for this patient encounter, including preparation, face-to-face counseling with the patient and coordination of care, and documentation of the encounter.  Comer Dollar, MD  Division of Gynecologic Oncology  Department of Obstetrics and Gynecology  [facility]

## 2024-03-05 ENCOUNTER — Encounter (HOSPITAL_BASED_OUTPATIENT_CLINIC_OR_DEPARTMENT_OTHER): Payer: Self-pay

## 2024-03-05 ENCOUNTER — Ambulatory Visit (HOSPITAL_BASED_OUTPATIENT_CLINIC_OR_DEPARTMENT_OTHER)

## 2024-03-05 ENCOUNTER — Ambulatory Visit (HOSPITAL_BASED_OUTPATIENT_CLINIC_OR_DEPARTMENT_OTHER)
Admission: RE | Admit: 2024-03-05 | Discharge: 2024-03-05 | Disposition: A | Discharge status: Home / Self Care | Source: Ambulatory Visit | Attending: Gynecologic Oncology | Admitting: Gynecologic Oncology

## 2024-03-05 DIAGNOSIS — C579 Malignant neoplasm of female genital organ, unspecified: Secondary | ICD-10-CM | POA: Diagnosis present

## 2024-03-05 MED ORDER — IOHEXOL 300 MG/ML  SOLN
100.0000 mL | Freq: Once | INTRAMUSCULAR | Status: AC | PRN
Start: 2024-03-05 — End: 2024-03-05
  Administered 2024-03-05: 100 mL via INTRAVENOUS

## 2024-03-07 ENCOUNTER — Ambulatory Visit: Payer: Self-pay | Admitting: Gynecologic Oncology

## 2024-05-02 ENCOUNTER — Ambulatory Visit: Admitting: Gynecologic Oncology

## 2024-05-09 ENCOUNTER — Ambulatory Visit: Admitting: Gynecologic Oncology

## 2024-05-20 ENCOUNTER — Telehealth: Payer: Self-pay | Admitting: *Deleted

## 2024-05-20 NOTE — Telephone Encounter (Signed)
 Spoke with patient and relayed that we have rescheduled her Friday, 12/5 appt. To Thursday, 12/4 same time at 1pm. Pt verbalized understanding and thanked the office for calling.

## 2024-05-21 NOTE — Progress Notes (Unsigned)
 Gynecologic Oncology Return Clinic Visit  05/23/24  Reason for Visit: surveillance   Treatment History: Oncology History Overview Note  HER2 negative, Neg Genetics MMR Normal P53 positive   Uterine cancer (HCC)  12/03/2021 Initial Diagnosis   Patient reports several month history of intermittent pelvic pain that she describes as discomfort or sometimes cramping, that has worsened with time.  She notes that this is tolerable.  She began having postmenopausal bleeding started on 6/17 with a few spots of blood.     12/29/2021 Imaging   1. Bilateral large multiloculated cystic lesions of the ovaries, concerning for ovarian malignancy. Recommend gynecologic consultation and pelvic ultrasound and/or pelvic MRI with contrast for further evaluation. 2. Large heterogeneous mass of the posterior pelvis likely arising from the lower uterus, possibly a fibroid, although malignancy is also a concern. Recommend attention on pelvic ultrasound or MRI as above. 3. Additional mass arising from the right uterine fundus which correlates with fibroid described on prior 2015 ultrasound. 4. Nonspecific small ground-glass nodule of the right lower lobe measuring 6 mm. Recommend follow-up chest CT in 6 months for further evaluation. 5. Aortic Atherosclerosis (ICD10-I70.0).   12/30/2021 Pathology Results   A. CERVIX, BIOPSY:  -  Scantly cellular specimen with predominantly blood, fibrin and acute inflammatory infiltrate but with scattered highly atypical cells highly suspicious for malignancy.   Note: A p16 shows strong positivity in these scattered cells; however, while p53 is mildly increased definitive strong overexpression or under expression is not identified.  These findings support the suspicion for malignancy; however, are insufficient for a definitive diagnosis.     01/03/2022 Imaging   MRI pelvis  1. Large mixed solid and cystic lesions of the bilateral ovaries, measuring 8.4 x 7.0 x 5.4 cm on the right  and 10.3 x 8.7 x 8.1 cm on the left, highly concerning for primary ovarian malignancy and contralateral ovarian metastasis.   2. Homogeneously enhancing mass arising from the right aspect of the uterine body and fundus measuring 6.0 x 5.5 x 4.3 cm, consistent with a uterine fibroid and similar to prior ultrasound dated 2015.   3. An additional, much more heterogeneous mass which appears to arise from the cervix or posterior lower uterine segment measuring 8.1 x 6.4 x 6.2 cm. This may reflect an additional fibroid with internal degeneration, however this was not clearly visualized on ultrasound dated 2015 and is highly suspicious for a cervical mass as significant enlargement of benign fibroids is not expected following menopause. This mass appears to closely abut and compress or perhaps even directly involve the adjacent rectum.   4.  No evidence of lymphadenopathy in the pelvis.   01/12/2022 PET scan   1. Hypermetabolic heterogeneous mass centered on the cervix/lower uterine segment is most consistent with primary gynecologic neoplasm. 2. Multiloculated complex bilateral adnexal cystic lesions with hypermetabolic soft tissue component likely reflect metastatic lesions. 3. No hypermetabolic abdominopelvic adenopathy. 4. No evidence of hypermetabolic metastatic disease in the chest or abdomen.   01/18/2022 Pathology Results   FINAL MICROSCOPIC DIAGNOSIS:   A. CERVICAL MASS, BIOPSIES:  - Poorly differentiated carcinoma with necrosis, consistent with high-grade serous carcinoma, see comment   B. CERVICAL MASS, EXCISION:  - Poorly differentiated carcinoma with necrosis, consistent with high-grade serous carcinoma see comment  - Edges of resected tissue fragments are positive for carcinoma   COMMENT:   A and B.   The tumor cells are positive for PAX8, ER and p16 immunostains.  Immunostain for p53 shows significant overexpression.  Immunostain for CK5/6 shows weak focal labeling while immunostain for   p63 is negative.  The immunoprofile is consistent with above interpretation.  The carcinoma is likely endometrial or ovarian origin with secondary involvement of the cervix.     01/23/2022 Initial Diagnosis   Uterine cancer (HCC)   01/23/2022 Cancer Staging   Staging form: Corpus Uteri - Carcinoma and Carcinosarcoma, AJCC 8th Edition - Clinical stage from 01/23/2022: FIGO Stage IIIA (cT3a, cN0, cM0) - Signed by Lonn Hicks, MD on 01/23/2022 Stage prefix: Initial diagnosis   01/31/2022 - 01/31/2022 Chemotherapy   Patient is on Treatment Plan : UTERINE ENDOMETRIAL Dostarlimab -gxly (500 mg) + Carboplatin  (AUC 5) + Paclitaxel  (175 mg/m2) q21d x 6 cycles / Dostarlimab -gxly (1000 mg) q42d x 6 cycles      01/31/2022 - 05/01/2023 Chemotherapy   Patient is on Treatment Plan : UTERINE ENDOMETRIAL Dostarlimab -gxly (500 mg) + Carboplatin  (AUC 5) + Paclitaxel  (175 mg/m2) q21d x 6 cycles / Dostarlimab -gxly (1000 mg) q42d x 6 cycles      04/14/2022 PET scan   1. Significant interval positive response to therapy. Mildly hypermetabolic solid 5.5 x 2.8 cm posterior uterine cervix mass and mildly hypermetabolic bilateral adnexal mixed cystic and solid masses are all significantly decreased in size and metabolism. 2. No new or progressive hypermetabolic metastatic disease. 3. Low level FDG uptake associated with healing subacute sclerotic nondisplaced anterior left third, fourth and fifth rib fractures without associated discrete osseous lesions, correlate for history of interval injury in this location. 4. Horseshoe kidney.   05/03/2022 Surgery   Robotic-assisted laparoscopic total hysterectomy with bilateral salpingo-oophorectomy, tumor debulking including infracolic omentectomy, mini-lap, cystoscopy, and vaginal laceration repair   Findings:  On speculum exam, normal-appearing cervix that is flush with the vaginal apex.  No visible cervical lesion.  On EUA, moderately mobile, enlarged uterus with mobile fullness  appreciated in the cul-de-sac.  On intra-abdominal entry, normal upper abdominal survey.  Some adhesions of the omentum to the anterior abdominal wall below the umbilicus were noted.  Normal-appearing omentum, small and large bowel.  No ascites.  Right ovary minimally enlarged but overall normal in appearance.  Left ovary replaced by a 6 cm cystic mass, smooth.  Normal-appearing bilateral fallopian tubes.  Bladder was somewhat adherent anteriorly to the cervix.  Uterus approximately 8 cm and deviated to the left given a 10 cm anterior and right-sided fibroid.  The cervix itself did not appear enlarged, either on intra-abdominal phonation or speculum exam.  Posteriorly, a white ridge of somewhat indurated tissue was noted underneath the cervix and just above the rectum.  On palpation, this ridge could be appreciated only on rectovaginal exam.  It was separate and free from the cervix itself.  It was difficult to distinguish whether this was treated tumor or active tumor although its detached location from the cervix and uterus was noted.  Given my conversation with the patient prior to surgery and that she would not want an ostomy (even temporarily), there was no way to resect this area out without significant risk of damage to the rectum.  Given its location, even in the setting of a bowel prep (which she had not had), she would likely require at least a protective diverting ostomy. Cystoscopy, bladder dome noted to be intact and good efflux seen from bilateral ureteral orifices.   05/03/2022 Pathology Results   A. UTERUS, CERVIX, BILATERAL TUBES AND OVARIES, RESECTION:  - Cervix      Nabothian cysts      Negative  for malignancy  - Endometrium      Inactive endometrium      Two benign endometrial polyps      Negative for malignancy  - Myometrium      Leiomyoma      Negative for malignancy  - Right ovary      Hemorrhagic cyst with fibrosis, hemosiderin and focal calcification      Negative for  malignancy  - Left ovary      Hemorrhagic cyst with fibrosis, hemosiderin and focal calcification      Negative for malignancy  - Bilateral fallopian tubes      Unremarkable      Negative for malignancy  - See oncology table   B. OMENTUM, EXCISION:  - Benign omental adipose tissue.  - Negative for malignancy.     Genetic Testing   Negative genetic testing. No pathogenic variants identified on the [facility] CancerNext-Expanded+RNA panel. VUS in SDHA called [id number] identified. The report date is 05/03/2022.  The CancerNext-Expanded + RNAinsight gene panel offered by [employer] and includes sequencing and rearrangement analysis for the following 77 genes: IP, ALK, APC*, ATM*, AXIN2, BAP1, BARD1, BLM, BMPR1A, BRCA1*, BRCA2*, BRIP1*, CDC73, CDH1*,CDK4, CDKN1B, CDKN2A, CHEK2*, CTNNA1, DICER1, FANCC, FH, FLCN, GALNT12, KIF1B, LZTR1, MAX, MEN1, MET, MLH1*, MSH2*, MSH3, MSH6*, MUTYH*, NBN, NF1*, NF2, NTHL1, PALB2*, PHOX2B, PMS2*, POT1, PRKAR1A, PTCH1, PTEN*, RAD51C*, RAD51D*,RB1, RECQL, RET, SDHA, SDHAF2, SDHB, SDHC, SDHD, SMAD4, SMARCA4, SMARCB1, SMARCE1, STK11, SUFU, TMEM127, TP53*,TSC1, TSC2, VHL and XRCC2 (sequencing and deletion/duplication); EGFR, EGLN1, HOXB13, KIT, MITF, PDGFRA, POLD1 and POLE (sequencing only); EPCAM and GREM1 (deletion/duplication only).   08/14/2022 Imaging   1. Prior hysterectomy without new suspicious enhancing soft tissue nodularity along the vaginal cuff. 2. Decreased size of the low-density focus along the left external iliac vessels, nonspecific. Interval decrease in size somewhat reassuring, and this is favored to reflect a small lymphocele or loculated fluid. However in the context of ongoing chemotherapy the interval decrease in size may reflect treatment response and as such while not favored metastatic disease can not be excluded. Continued attention on follow-up imaging is suggested. 3. No evidence of new or progressive disease in the abdomen or pelvis. 4. Diffuse  hepatic steatosis.   11/02/2022 Imaging   CT ABDOMEN PELVIS W CONTRAST  Result Date: 11/02/2022 CLINICAL DATA:  Pelvic pain. Uterine carcinoma. Evaluate for recurrence. * Tracking Code: BO * EXAM: CT ABDOMEN AND PELVIS WITH CONTRAST TECHNIQUE: Multidetector CT imaging of the abdomen and pelvis was performed using the standard protocol following bolus administration of intravenous contrast. RADIATION DOSE REDUCTION: This exam was performed according to the departmental dose-optimization program which includes automated exposure control, adjustment of the mA and/or kV according to patient size and/or use of iterative reconstruction technique. CONTRAST:  OMNIPAQUE  IOHEXOL  300 MG/ML  SOLN COMPARISON:  08/11/2022 FINDINGS: Lower Chest: No acute findings. Hepatobiliary: No hepatic masses identified. Moderate diffuse hepatic steatosis. Gallbladder is unremarkable. No evidence of biliary ductal dilatation. Pancreas:  No mass or inflammatory changes. Spleen: Within normal limits in size and appearance. 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 No suspicious masses identified. No evidence of ureteral calculi or hydronephrosis. Unremarkable unopacified urinary bladder. Stomach/Bowel: No evidence of obstruction, inflammatory process or abnormal fluid collections. Normal appendix visualized. Vascular/Lymphatic: No pathologically enlarged lymph nodes. No acute vascular findings. Reproductive: Prior hysterectomy noted. No evidence of pelvic mass or free fluid. No evidence of peritoneal or omental soft tissue nodularity. Other:  None. Musculoskeletal:  No suspicious bone lesions identified. IMPRESSION: No  acute findings. No evidence of recurrent or metastatic carcinoma within the abdomen or pelvis. Hepatic steatosis. Horseshoe kidney. Electronically Signed   By: Norleen DELENA Kil M.D.   On: 11/02/2022 15:24      12/22/2022 Procedure   Successful removal of implanted Port-A-Cath.    02/20/2023 Imaging   CT ABDOMEN  PELVIS W CONTRAST  Result Date: 02/26/2023 CLINICAL DATA:  Endometrial cancer restaging, abdominal and pelvic pain * Tracking Code: BO * EXAM: CT ABDOMEN AND PELVIS WITH CONTRAST TECHNIQUE: Multidetector CT imaging of the abdomen and pelvis was performed using the standard protocol following bolus administration of intravenous contrast. RADIATION DOSE REDUCTION: This exam was performed according to the departmental dose-optimization program which includes automated exposure control, adjustment of the mA and/or kV according to patient size and/or use of iterative reconstruction technique. CONTRAST:  OMNIPAQUE  IOHEXOL  300 MG/ML  SOLN COMPARISON:  11/01/2022 FINDINGS: Lower chest: No acute abnormality. Hepatobiliary: No solid liver abnormality is seen. Severe hepatic steatosis. Hepatomegaly, maximum coronal span 20.6 cm. No gallstones, gallbladder wall thickening, or biliary dilatation. Pancreas: Unchanged atrophic, calcified appearance of the pancreatic tail. No pancreatic ductal dilatation or surrounding inflammatory changes. Spleen: Normal in size without significant abnormality. Adrenals/Urinary Tract: Adrenal glands are unremarkable. Horseshoe kidney. No calculi or hydronephrosis. Bladder is unremarkable. Stomach/Bowel: Stomach is within normal limits. Appendix appears normal. No evidence of bowel wall thickening, distention, or inflammatory changes. Vascular/Lymphatic: Aortic atherosclerosis. No enlarged abdominal or pelvic lymph nodes. Reproductive: Status post hysterectomy. Other: No abdominal wall hernia or abnormality. No ascites. Musculoskeletal: No acute or significant osseous findings. IMPRESSION: 1. No acute CT findings of the abdomen or pelvis to explain abdominal pain. 2. Status post hysterectomy. No evidence of lymphadenopathy or metastatic disease in the abdomen or pelvis. 3. Severe hepatic steatosis and hepatomegaly. 4. Horseshoe kidney. Aortic Atherosclerosis (ICD10-I70.0). Electronically  Signed   By: Marolyn JONETTA Jaksch M.D.   On: 02/26/2023 13:54        Interval History: Patient presents today to the office for continued surveillance.  She reports overall doing well.  She is concerned about the persistent symptoms she has noticed after treatment.  She continues to have nail fungal issues that may be slightly improved but still persistent.  She continues to have skin discolorations on both sides of her face.  She feels treatment related side effects are affecting her overall wellbeing and contributing to her depression.  She was recently prescribed Lexapro.  She is concerned about potential changes internally based on the changes that have taken place externally that she can visibly see.  She wonders if the chemotherapy was worth it and necessary.  She feels like she has not been able to regain total functionality after receiving treatment.  No pain reported.  Tolerating diet.  Bowels and bladder functioning at baseline.  No vaginal bleeding or discharge reported.  No lower extremity edema.  She does have neuropathy in her fingertips and toes from previous chemotherapy.  She is recovering from a sinus infection and has been taking Augmentin.  Overall no symptoms concerning for recurrence voiced.  She has seen dermatology for the hyperpigmentation on her face and about her fingernails.  She recently had a bone density scan that showed she had osteoporosis.  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   oncologist-- dr lonn;   dx 06/ 2023   Multinodular thyroid     bilateral nodules ;  hx bx 07-31-2017 benign ;  last ultrasound in epic 01-08-2020   Neuropathy due to chemotherapeutic drug    Uterine cancer [facility]) 01/2022   gyn oncologist-- dr tucker/   oncologist--- dr lonn;  dx 01-23-2022,  started chemo 01-31-2022    Past Surgical History:  Procedure Laterality Date   CESAREAN SECTION     COLONOSCOPY  2016   dr lynwood [facility])   CYSTOSCOPY N/A 05/03/2022   Procedure: CYSTOSCOPY;  Surgeon: Viktoria Comer SAUNDERS, MD;  Location: [facility];  Service: Gynecology;  Laterality: N/A;   DILATION AND EVACUATION  08/17/2000   @[facility]  for missed ab   IR IMAGING GUIDED PORT INSERTION  01/25/2022   IR REMOVAL TUN ACCESS W/ PORT W/O FL MOD SED  12/22/2022   LESION REMOVAL N/A 01/18/2022   Procedure: CERVICAL BIOPSIES;  Surgeon: Viktoria Comer SAUNDERS, MD;  Location: [facility];  Service: Gynecology;  Laterality: N/A;   ROBOTIC ASSISTED TOTAL HYSTERECTOMY WITH BILATERAL SALPINGO OOPHERECTOMY  05/03/2022   @[facility]  by dr viktoria ;   with mini lap, omentectomy, tumor debulking    Family History  Problem Relation Age of Onset   Thyroid  disease Mother    Breast cancer Mother        dx late 60s   Cancer Other        unk type, d. 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  Tobacco Use   Smoking status: Never   Smokeless tobacco: Never  Vaping Use   Vaping status: Never Used  Substance and Sexual Activity   Alcohol use: Never   Drug use: Never   Sexual activity: Not Currently  Other Topics Concern   Not on file  Social History Narrative   Not on file   Social Drivers of Health   Financial Resource Strain: Not on file  Food Insecurity: Low Risk  (06/20/2023)   Received from [facility]   Hunger Vital Sign    Within the past 12 months, you worried that your food would run out before  you got money to buy more: Never true    Within the past 12 months, the food you bought just didn't last and you didn't have money to get more. : Never true  Transportation Needs: No Transportation Needs (06/20/2023)   Received from [facility]    In the past 12 months, has lack of reliable transportation kept you from medical appointments, meetings, work or from getting things needed for daily living? : No  Physical Activity: Not on file  Stress: Not on file  Social Connections: Not on file    Current Medications:  Current Outpatient Medications:    amoxicillin-clavulanate (AUGMENTIN) 875-125 MG tablet, Take 1 tablet by mouth 2 (two) times daily. 05/23/2024 is last day, Disp: , Rfl:    ascorbic acid (VITAMIN C) 250 MG  CHEW, Chew 500 mg by mouth daily., Disp: , Rfl:    Berberine Chloride 500 MG CAPS, Take 500 mg by mouth daily., Disp: , Rfl:    Biotin 1 MG CAPS, Take 1 mg by mouth daily., Disp: , Rfl:    calcium carbonate (TUMS - DOSED IN MG ELEMENTAL CALCIUM) 500 MG chewable tablet, Chew 1 tablet by mouth daily., Disp: , Rfl:    Cholecalciferol (VITAMIN D3 PO), Take 1 tablet by mouth daily., Disp: , Rfl:    escitalopram (LEXAPRO) 10 MG tablet, Take 10 mg by mouth daily., Disp: , Rfl:    melatonin 5 MG TABS, Take 5 mg by mouth every evening., Disp: , Rfl:    Multiple Vitamin (MULTIVITAMIN WITH MINERALS) TABS tablet, Take 1 tablet by mouth daily., Disp: , Rfl:    SYNTHROID  75 MCG tablet, Take 1 tablet (75 mcg) by mouth daily before breakfast., Disp: 30 tablet, Rfl: 1   Turmeric 1053 MG TABS, 1 tablet Orally once a day, Disp: , Rfl:    zinc gluconate 50 MG tablet, Take 50 mg by mouth daily., Disp: , Rfl:    eszopiclone (LUNESTA) 1 MG TABS tablet, Take 1 tablet by mouth daily. (Patient not taking: Reported on 05/22/2024), Disp: , Rfl:   Review of Systems: See interval. On intake ROS form, no new symptoms reported. Denies appetite changes, fevers, chills, fatigue, unexplained  weight changes. Denies hearing loss, neck lumps or masses, mouth sores, ringing in ears or voice changes. Denies cough or wheezing.  Denies shortness of breath. Denies chest pain or palpitations. Denies leg swelling. Denies abdominal distention, pain, blood in stools, constipation, diarrhea, nausea, vomiting, or early satiety. Denies pain with intercourse, dysuria, frequency, hematuria or incontinence. Denies hot flashes, pelvic pain, vaginal bleeding or vaginal discharge.   Denies joint pain, back pain or muscle pain/cramps. Denies itching, rash, or wounds. Denies dizziness, headaches, numbness or seizures. Denies swollen lymph nodes or glands, denies easy bruising or bleeding. Denies confusion or decreased concentration.  Physical Exam: BP (!) 147/77 (BP Location: Left Arm, Patient Position: Sitting)   Pulse 94   Temp 98.2 F (36.8 C)   Resp 18   Wt 137 lb (62.1 kg)   SpO2 100%   BMI 23.68 kg/m  General: Alert, oriented, no acute distress. HEENT: Normocephalic, atraumatic, sclera anicteric.  Some hyperpigmentation along forehead, lateral face bilaterally, right neck Pulmonary: Unlabored breathing on room air.  Lungs clear to auscultation bilaterally, no wheezes or rhonchi. Cardiovascular: Regular rate and rhythm, no murmurs or rubs appreciated. Abdomen: soft, nontender.  Normoactive bowel sounds.  No masses or hepatosplenomegaly appreciated.  Well-healed incisions. Extremities: Grossly normal range of motion.  Warm, well perfused.  No edema bilaterally. Thinning and white discoloration of most of her nail beds on hands bilaterally. Lymphatics: No inguinal, clavicular neuropathy. GU: Normal appearing external genitalia without erythema, excoriation, or lesions.  Speculum exam with extra small speculum is poorly tolerated by the patient. On today's exam, I am able to insert the speculum just a few cm and was not able to open.  On single digit bimanual exam, I was able to feel most of  the length of the vagina but exam aborted due to patient's intolerance, no nodularity or masses appreciated.  Some agglutination of the posterior introitus.   Laboratory & Radiologic Studies: Labs in EPIC from 04/01/2024 Last CT in September 2025  Assessment & Plan: Tiffany [name] is a 63 y.o. 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  Last imaging 02/2024 without evidence of recurrent disease. Stable pulmonary nodules, favored to be benign.  Stable faint soft tissue in the pelvis bilaterally, nonspecific.   Given side effects from immunotherapy, she ultimately decided to come off of treatment at the end of last year. Most recent CT scan is negative for recurrent disease.  Pelvic exam continues to be somewhat challenging.   The patient is doing very well.  She is NED on limited exam today. CT imaging is scheduled in 08/2024.   Dr. Viktoria to the room at the end of visit to address patient's post treatment related concerns. Offered having the patient meet with Dr. Lonn, Medical Oncologist, to discuss symptoms but patient declines at this time. Signs and symptoms that would be concerning for disease recurrence given on AVS with recommendation to call for any changes. Follow up has been arranged for 3 months after CT imaging.  22 minutes of total time was spent for this patient encounter, including preparation, face-to-face counseling with the patient and coordination of care, and documentation of the encounter.  Eleanor Epps NP [facility] GYN Oncology

## 2024-05-21 NOTE — Patient Instructions (Incomplete)
 It was good to see you today.  I do not see or feel any evidence of cancer recurrence on your exam.   Plan to see Dr. Viktoria for follow-up in 3 months.   As always, if you develop any new and concerning symptoms before your next visit, please call to see me sooner.  Symptoms to report to your health care team include vaginal bleeding, rectal bleeding, bloating, weight loss without effort, new and persistent pain, new and  persistent fatigue, new leg swelling, new masses (i.e., bumps in your neck or groin), new and persistent cough, new and persistent nausea and vomiting, change in bowel or bladder habits, and any other concerns.

## 2024-05-22 ENCOUNTER — Inpatient Hospital Stay: Attending: Gynecologic Oncology | Admitting: Gynecologic Oncology

## 2024-05-22 ENCOUNTER — Inpatient Hospital Stay

## 2024-05-22 VITALS — BP 147/77 | HR 94 | Temp 98.2°F | Resp 18 | Wt 137.0 lb

## 2024-05-22 DIAGNOSIS — Z9079 Acquired absence of other genital organ(s): Secondary | ICD-10-CM | POA: Insufficient documentation

## 2024-05-22 DIAGNOSIS — Z90710 Acquired absence of both cervix and uterus: Secondary | ICD-10-CM | POA: Diagnosis not present

## 2024-05-22 DIAGNOSIS — F32A Depression, unspecified: Secondary | ICD-10-CM | POA: Diagnosis not present

## 2024-05-22 DIAGNOSIS — Z9221 Personal history of antineoplastic chemotherapy: Secondary | ICD-10-CM | POA: Diagnosis not present

## 2024-05-22 DIAGNOSIS — C579 Malignant neoplasm of female genital organ, unspecified: Secondary | ICD-10-CM

## 2024-05-22 DIAGNOSIS — Z79899 Other long term (current) drug therapy: Secondary | ICD-10-CM | POA: Insufficient documentation

## 2024-05-22 DIAGNOSIS — Z90722 Acquired absence of ovaries, bilateral: Secondary | ICD-10-CM | POA: Insufficient documentation

## 2024-05-22 DIAGNOSIS — Z8542 Personal history of malignant neoplasm of other parts of uterus: Secondary | ICD-10-CM | POA: Diagnosis present

## 2024-05-23 ENCOUNTER — Inpatient Hospital Stay: Admitting: Gynecologic Oncology

## 2024-06-26 ENCOUNTER — Inpatient Hospital Stay: Admitting: Gynecologic Oncology

## 2024-09-01 ENCOUNTER — Other Ambulatory Visit (HOSPITAL_BASED_OUTPATIENT_CLINIC_OR_DEPARTMENT_OTHER)

## 2024-09-11 ENCOUNTER — Inpatient Hospital Stay: Attending: Gynecologic Oncology | Admitting: Gynecologic Oncology
